# Patient Record
Sex: Female | Born: 1985 | Race: Black or African American | Hispanic: No | Marital: Single | Smoking: Former smoker
Health system: Southern US, Community
[De-identification: ages and names within clinical notes are randomized; demographics above are authoritative.]

## PROBLEM LIST (undated history)

## (undated) DIAGNOSIS — O9089 Other complications of the puerperium, not elsewhere classified: Secondary | ICD-10-CM

## (undated) DIAGNOSIS — R52 Pain, unspecified: Secondary | ICD-10-CM

## (undated) DIAGNOSIS — Z789 Other specified health status: Secondary | ICD-10-CM

## (undated) DIAGNOSIS — K219 Gastro-esophageal reflux disease without esophagitis: Secondary | ICD-10-CM

## (undated) DIAGNOSIS — I10 Essential (primary) hypertension: Secondary | ICD-10-CM

## (undated) HISTORY — PX: OTHER SURGICAL HISTORY: SHX169

## (undated) HISTORY — PX: DILATION AND CURETTAGE OF UTERUS: SHX78

---

## 2005-09-29 ENCOUNTER — Inpatient Hospital Stay (HOSPITAL_COMMUNITY): Admission: RE | Admit: 2005-09-29 | Discharge: 2005-10-02 | Payer: Self-pay | Admitting: Obstetrics & Gynecology

## 2006-11-22 ENCOUNTER — Emergency Department: Payer: Self-pay | Admitting: General Practice

## 2007-05-29 ENCOUNTER — Emergency Department: Payer: Self-pay | Admitting: Emergency Medicine

## 2007-05-30 ENCOUNTER — Emergency Department: Payer: Self-pay | Admitting: Internal Medicine

## 2008-03-23 ENCOUNTER — Emergency Department (HOSPITAL_COMMUNITY): Admission: EM | Admit: 2008-03-23 | Discharge: 2008-03-23 | Payer: Self-pay | Admitting: Emergency Medicine

## 2010-04-06 ENCOUNTER — Other Ambulatory Visit: Admission: RE | Admit: 2010-04-06 | Discharge: 2010-04-06 | Payer: Self-pay | Admitting: Family Medicine

## 2010-04-06 ENCOUNTER — Other Ambulatory Visit: Admission: RE | Admit: 2010-04-06 | Discharge: 2010-04-06 | Payer: Self-pay | Admitting: Unknown Physician Specialty

## 2010-08-31 ENCOUNTER — Emergency Department (HOSPITAL_COMMUNITY)
Admission: EM | Admit: 2010-08-31 | Discharge: 2010-08-31 | Disposition: A | Discharge status: Home / Self Care | Payer: Self-pay | Attending: Emergency Medicine | Admitting: Emergency Medicine

## 2010-08-31 DIAGNOSIS — R109 Unspecified abdominal pain: Secondary | ICD-10-CM | POA: Insufficient documentation

## 2010-08-31 LAB — URINALYSIS, ROUTINE W REFLEX MICROSCOPIC
Bilirubin Urine: NEGATIVE
Glucose, UA: NEGATIVE mg/dL
Hgb urine dipstick: NEGATIVE
Specific Gravity, Urine: 1.02 (ref 1.005–1.030)
pH: 6.5 (ref 5.0–8.0)

## 2010-08-31 LAB — BASIC METABOLIC PANEL
CO2: 29 mEq/L (ref 19–32)
GFR calc non Af Amer: >60 mL/min (ref >60)
Glucose, Bld: 82 mg/dL (ref 70–99)
Potassium: 3.7 mEq/L (ref 3.5–5.1)
Sodium: 139 mEq/L (ref 135–145)

## 2010-08-31 LAB — CBC
HCT: 38 % (ref 36.0–46.0)
MCV: 85.8 fL (ref 78.0–100.0)
RDW: 13.9 % (ref 11.5–15.5)
WBC: 5.8 10*3/uL (ref 4.0–10.5)

## 2010-08-31 LAB — DIFFERENTIAL
Eosinophils Relative: 3 % (ref 0–5)
Lymphocytes Relative: 31 % (ref 12–46)
Lymphs Abs: 1.8 10*3/uL (ref 0.7–4.0)
Monocytes Absolute: 0.6 10*3/uL (ref 0.1–1.0)

## 2010-10-29 NOTE — H&P (Signed)
NAMEDORTHULA, [name]                ACCOUNT NO.:  [id number]   MEDICAL RECORD NO.:  [id number]          PATIENT TYPE:  INP   LOCATION:  LDR2                          FACILITY:  [facility]   PHYSICIAN:  Tilda Burrow, M.D. DATE OF BIRTH:  29-Sep-1985   DATE OF ADMISSION:  09/29/2005  DATE OF DISCHARGE:  LH                                HISTORY & PHYSICAL   ADMISSION DIAGNOSES:  1.  Pregnancy 40+ weeks gestation.  2.  Latent phase labor.  3.  Spontaneous rupture of membranes.   HISTORY OF PRESENT ILLNESS:  This 25 year old female gravida 3, para 0, AB  2, LMP December 21, 2004, placing menstrual [facility] September 26, 2005, with  corresponding ultrasounds is followed since [redacted] weeks gestation, June 01, 2006, through our office.  She transferred from [place], [place].   PRENATAL LABS:  Blood type A positive.  Urine drug screen negative.  Rubella  immune.  Hemoglobin 11, hematocrit 38.  Hepatitis, HIV, RPR, GC and  Chlamydia all negative.  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  Platelets were 127,000. RPR  nonreactive.  She had low grade cervical abnormalities on Pap smear which  will be monitored postpartum.  Group B Strep negative.   She did not attend childbirth classes.  She requested epidural which she has  received already.   PHYSICAL EXAMINATION:  GENERAL:  A healthy-appearing, somewhat nonverbal  [place] female, alert and oriented x3.  She is accompanied by  several family members.  She did not attend childbirth classes.  CERVIX:  By nursing exam, 4 cm at last check.   IMPRESSION:  Pregnancy at term.  Latent phase labor.  Will require Pitocin  augmentation now that epidural is in place.  Will anticipate improved  progress in labor.      Tilda Burrow, M.D.  Electronically Signed     JVF/MEDQ  D:  09/29/2005  T:  09/29/2005  Job:  [id number]   cc:   Dr. Stephania Fragmin  [facility]   [facility]

## 2010-10-29 NOTE — Group Therapy Note (Signed)
NAME:  VEAR, STATON                ACCOUNT NO.:  [id number]   MEDICAL RECORD NO.:  [id number]          PATIENT TYPE:  INP   LOCATION:  LDR2                          FACILITY:  [facility]   PHYSICIAN:  Lazaro Arms, M.D.   DATE OF BIRTH:  10-19-1985   DATE OF PROCEDURE:  DATE OF DISCHARGE:                                   PROGRESS NOTE   Symphanie progressed steadily through labor after Pitocin augmentation.  She  did have frequent variable decelerations.  Most of them were mild in nature  with rapid return to baseline.  She did have the occasional 40-50  __________, usually with the heart rate not decelerating past 100.  Variability remained average, and she maintained accelerations of the fetal  heart rates.   She developed some pressure and urge to push at approximately 2115.  After a  45 minute second stage, she had a spontaneous vaginal delivery of a viable  female infant.  There was no nuchal cord or any obvious reason for the cord  compression.  The mouth and nose were suctioned on the perineum, and the  body delivered without any difficulty.  Weight is 6 pounds, 14 ounces.  Apgars are 9 and 9.   Always interesting, the baby had a lusty cry before the delivery of the  body.   Pitocin 20 units diluted in 1000 cc of lactated Ringer's was infused rapidly  IV.  The placenta separated spontaneously and delivered by a control cord  traction and maternal pushing effort at 2210.  It was inspected and appears  to be intact with a three vessel cord.  The fundus was immediately firm, and  blood loss was minimal.  Estimated blood loss 200 cc.  The vagina was then  inspected, and no lacerations were found.  The patient complained of a lot  of cramping post partum, so she received morphine sulfate, which resulted in  relief from her cramping.  She plans to breast-feed.      Jacklyn Shell, C.N.M.      Lazaro Arms, M.D.  Electronically Signed    FC/MEDQ  D:  09/29/2005  T:   09/30/2005  Job:  [id number]   cc:   Lorin Picket A. Gerda Diss, MD  Fax: [phone]

## 2010-10-29 NOTE — Op Note (Signed)
[name], [name]                ACCOUNT NO.:  [id number]   MEDICAL RECORD NO.:  [id number]          PATIENT TYPE:  INP   LOCATION:  A410                          FACILITY:  [facility]   PHYSICIAN:  Tilda Burrow, M.D. DATE OF BIRTH:  November 11, 1985   DATE OF PROCEDURE:  09/29/2005  DATE OF DISCHARGE:                                 OPERATIVE REPORT   ADMITTING DIAGNOSIS:  .   PROCEDURE:  Epidural catheter placement, September 29, 2005, 2:30 p.m.   Continuous lumbar epidural catheter placed at the L3 interspace, after  patient acknowledged desire for epidural, she has progressed to 4 cm  spontaneously with minimal labor at this time, tolerated poorly by patient.   DETAILS OF THE PROCEDURE:  Epidural was placed on L3-4 interspace using loss  of resistance technique after standard prepping and draping.  Epidural space  was identified 7 cm beneath the skin on first attempt with loss of  resistance technique.  Lidocaine 1.5%, with epinephrine, was injected  through the Touhy needle followed by insertion of the epidural catheter into  the epidural space without difficulty.  The patient had the needle removed,  catheter taped to the back, infusion at 12 mL per hour initiated.  There was  no additional bolus required.  The patient had good analgesic effect,  symmetric at T10 level confirmed.  The patient will now be begun on Pitocin  augmentation of labor.      Tilda Burrow, M.D.  Electronically Signed     JVF/MEDQ  D:  09/29/2005  T:  09/30/2005  Job:  [id number]   cc:   [facility]  [address]  [place], [place] [address]

## 2010-10-29 NOTE — H&P (Signed)
[name], [name]                ACCOUNT NO.:  [id number]   MEDICAL RECORD NO.:  [id number]          PATIENT TYPE:  INP   LOCATION:  LDR2                          FACILITY:  [facility]   PHYSICIAN:  Lazaro Arms, M.D.   DATE OF BIRTH:  [birth year]   DATE OF ADMISSION:  09/29/2005  DATE OF DISCHARGE:  LH                                HISTORY & PHYSICAL   CHIEF COMPLAINT:  Rupture of membranes at 0845 hours.   HISTORY OF PRESENT ILLNESS:  [name] is a 25 year old gravida 3, para 0, AB 2  with an EDC of September 26, 2005, based on last menstrual period and  correlating ultrasound.  She began prenatal care in [place] in her  first trimester and has had regular visits since then.  She transferred to  our office at approximately 23 weeks.  Her prenatal course has basically  been uneventful.   PRENATAL LABS:  Blood type A+.  Rubella immune.  HB SAG, HIV, RPR, gonorrhea  and Chlamydia are all negative.   She did have platelets of 127 back in December; however, subsequent  platelets have been normal in the 160 range.  Her Pap smear revealed LSIL,  for which she had a colposcopy.  Recommendations were colposcopy and  possible biopsy after delivery.  She is positive for HSV 1 and II.  She was  started on suppressive therapy of Valtrex 1 gm daily, about three weeks ago,  and has been compliant.   Blood pressures have been 100-120s/60-70s.  Group B strep was performed;  however, I do not have the results at this time.   PAST MEDICAL HISTORY:  Noncontributory.   No surgical history.   MEDICATIONS:  Prenatal vitamins.   SOCIAL HISTORY:  She is single.  The father of the baby is supportive.   FAMILY HISTORY:  None.   Her OB history is positive for two eight week miscarriages in 2003 and 2006  without complications.   PHYSICAL EXAMINATION:  HEENT:  Within normal limits.  HEART:  Regular rate and rhythm.  LUNGS:  Clear.  ABDOMEN:  Soft and nontender.  She is having some mild  contractions every 3-  5 minutes.  Fetal heart rate upon admission to the birthing unit revealed  average long-term variability with __________ present; however, there were a  few reflex of decelerations, late in nature.  She was given IV fluid bolus  and oxygen via rebreather mask, and that seems to have resolved the pattern  to a normal pattern at this time.  Cervix is 3 cm, 50% effaced, -1 station,  leaking a moderate amount of clear amniotic fluid.  Legs are negative.   IMPRESSION:  Intrauterine pregnancy at 40 weeks, 2 days.  Spontaneous  rupture of membranes, beginning labor.   PLAN:  Expectant management at this point.  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      Jacklyn Shell, C.N.M.      Lazaro Arms, M.D.  Electronically Signed    FC/MEDQ  D:  09/29/2005  T:  09/29/2005  Job:  [id number]   cc:   Francoise Schaumann. Milford Cage DO, FAAP  Fax: [phone]

## 2010-11-10 ENCOUNTER — Emergency Department (HOSPITAL_COMMUNITY)
Admission: EM | Admit: 2010-11-10 | Discharge: 2010-11-10 | Disposition: A | Discharge status: Home / Self Care | Payer: Self-pay | Attending: Emergency Medicine | Admitting: Emergency Medicine

## 2010-11-10 ENCOUNTER — Emergency Department (HOSPITAL_COMMUNITY): Payer: Self-pay

## 2010-11-10 DIAGNOSIS — O200 Threatened abortion: Secondary | ICD-10-CM | POA: Insufficient documentation

## 2010-11-10 DIAGNOSIS — R109 Unspecified abdominal pain: Secondary | ICD-10-CM | POA: Insufficient documentation

## 2010-11-10 LAB — BASIC METABOLIC PANEL
BUN: 8 mg/dL (ref 6–23)
Calcium: 10.1 mg/dL (ref 8.4–10.5)
Creatinine, Ser: 0.68 mg/dL (ref 0.4–1.2)
GFR calc non Af Amer: >60 mL/min (ref >60)
Glucose, Bld: 95 mg/dL (ref 70–99)
Sodium: 134 mEq/L — ABNORMAL LOW (ref 135–145)

## 2010-11-10 LAB — WET PREP, GENITAL
Trich, Wet Prep: NONE SEEN
Yeast Wet Prep HPF POC: NONE SEEN

## 2010-11-10 LAB — RUBELLA ANTIBODY, IGM: Rubella: IMMUNE

## 2010-11-10 LAB — HCG, QUANTITATIVE, PREGNANCY: hCG, Beta Chain, Quant, S: 946 m[IU]/mL — ABNORMAL HIGH (ref <5)

## 2010-11-10 LAB — URINALYSIS, ROUTINE W REFLEX MICROSCOPIC
Glucose, UA: NEGATIVE mg/dL
Hgb urine dipstick: NEGATIVE
Protein, ur: NEGATIVE mg/dL
pH: 6.5 (ref 5.0–8.0)

## 2010-11-10 LAB — HIV ANTIBODY (ROUTINE TESTING W REFLEX): HIV: NONREACTIVE

## 2010-11-10 LAB — POCT PREGNANCY, URINE: Preg Test, Ur: POSITIVE

## 2010-11-10 LAB — ABO/RH: ABO/RH(D): A POS

## 2010-11-10 LAB — ANTIBODY SCREEN: Antibody Screen: NEGATIVE

## 2010-11-12 LAB — GC/CHLAMYDIA PROBE AMP, GENITAL
Chlamydia, DNA Probe: NEGATIVE
GC Probe Amp, Genital: NEGATIVE

## 2010-11-25 LAB — STREP B DNA PROBE: GBS: POSITIVE

## 2010-12-21 ENCOUNTER — Other Ambulatory Visit: Payer: Self-pay | Admitting: Family Medicine

## 2010-12-21 ENCOUNTER — Other Ambulatory Visit (HOSPITAL_COMMUNITY)
Admission: RE | Admit: 2010-12-21 | Discharge: 2010-12-21 | Disposition: A | Discharge status: Home / Self Care | Payer: Medicaid Other | Source: Ambulatory Visit | Attending: Obstetrics and Gynecology | Admitting: Obstetrics and Gynecology

## 2010-12-21 DIAGNOSIS — Z113 Encounter for screening for infections with a predominantly sexual mode of transmission: Secondary | ICD-10-CM | POA: Insufficient documentation

## 2010-12-21 DIAGNOSIS — Z01419 Encounter for gynecological examination (general) (routine) without abnormal findings: Secondary | ICD-10-CM | POA: Insufficient documentation

## 2011-02-27 ENCOUNTER — Other Ambulatory Visit: Payer: Self-pay

## 2011-02-27 ENCOUNTER — Encounter: Payer: Self-pay | Admitting: *Deleted

## 2011-02-27 ENCOUNTER — Emergency Department (HOSPITAL_COMMUNITY)
Admission: EM | Admit: 2011-02-27 | Discharge: 2011-02-28 | Disposition: A | Discharge status: Home / Self Care | Payer: Medicaid Other | Attending: Emergency Medicine | Admitting: Emergency Medicine

## 2011-02-27 DIAGNOSIS — R42 Dizziness and giddiness: Secondary | ICD-10-CM | POA: Insufficient documentation

## 2011-02-27 DIAGNOSIS — E161 Other hypoglycemia: Secondary | ICD-10-CM | POA: Insufficient documentation

## 2011-02-27 DIAGNOSIS — R55 Syncope and collapse: Secondary | ICD-10-CM

## 2011-02-27 DIAGNOSIS — H538 Other visual disturbances: Secondary | ICD-10-CM | POA: Insufficient documentation

## 2011-02-27 DIAGNOSIS — Z331 Pregnant state, incidental: Secondary | ICD-10-CM

## 2011-02-27 DIAGNOSIS — E162 Hypoglycemia, unspecified: Secondary | ICD-10-CM

## 2011-02-27 LAB — URINALYSIS, ROUTINE W REFLEX MICROSCOPIC
Bilirubin Urine: NEGATIVE
Glucose, UA: NEGATIVE mg/dL
Ketones, ur: NEGATIVE mg/dL
Leukocytes, UA: NEGATIVE
Protein, ur: NEGATIVE mg/dL
pH: 7 (ref 5.0–8.0)

## 2011-02-27 LAB — CBC
HCT: 34.3 % — ABNORMAL LOW (ref 36.0–46.0)
Hemoglobin: 11.5 g/dL — ABNORMAL LOW (ref 12.0–15.0)
MCH: 28.2 pg (ref 26.0–34.0)
MCHC: 33.5 g/dL (ref 30.0–36.0)
MCV: 84.1 fL (ref 78.0–100.0)
RDW: 14.4 % (ref 11.5–15.5)

## 2011-02-27 LAB — DIFFERENTIAL
Basophils Absolute: 0 10*3/uL (ref 0.0–0.1)
Basophils Relative: 0 % (ref 0–1)
Eosinophils Relative: 1 % (ref 0–5)
Monocytes Absolute: 0.8 10*3/uL (ref 0.1–1.0)
Monocytes Relative: 8 % (ref 3–12)

## 2011-02-27 LAB — BASIC METABOLIC PANEL
BUN: 11 mg/dL (ref 6–23)
Calcium: 10.2 mg/dL (ref 8.4–10.5)
Creatinine, Ser: 0.64 mg/dL (ref 0.50–1.10)
GFR calc Af Amer: >60 mL/min (ref >60)

## 2011-02-27 LAB — GLUCOSE, CAPILLARY: Glucose-Capillary: 139 mg/dL — ABNORMAL HIGH (ref 70–99)

## 2011-02-27 MED ORDER — SODIUM CHLORIDE 0.9 % IV SOLN: Freq: Once | INTRAVENOUS | Status: DC

## 2011-02-27 NOTE — ED Notes (Signed)
Pt was brought in by [facility] for c/o hypoglycemia; pt states she was at a store when she felt dizzy and hot; pt states she went outside to get some air and was told people were holding her up to prevent her from falling; pt states she did not feel like she passed out; pt's CBG on scene was 50 and was not given anything to eat or drink; pt's CBG here in ED was 83; pt denies any pain

## 2011-02-27 NOTE — ED Provider Notes (Signed)
Scribed for Barbara Bailiff, MD, the patient was seen in room APA01/APA01 . This chart was scribed by Ellie Lunch. This patient's care was started at 7:48 PM.   CSN: [id number] Arrival date & time: 02/27/2011  7:40 PM   Chief Complaint  Patient presents with  . Hypoglycemia     (Include location/radiation/quality/duration/timing/severity/associated sxs/prior treatment) HPI [name] is a 25 y.o. female [redacted] weeks pregnant brought in by EMS Emergency Department complaining of hypoglycemia. Pt reports she was shopping when she became lightheaded, dizzy, and vision blurred and felt like she was going to pass out ~one hour ago. Pt went outside for fresh air and was told that people were holding her up to prevent her from falling. Pt denies losing consciousness. Per EMS CBG was 50 at scene. Pt denies abdominal pain, vaginal bleeding, or vaginal discharge. No history of similar sx.  Pt reports some improvement now. Reports feeling better with food and fluids.   HPI ELEMENTS:   Onset: ~ one hour ago     Modifying factors: improved with fluids and food  Context: as above  Associated symptoms: dizziness, lightheadedness, blurred vision   HPI  History reviewed. No pertinent past medical history.   Past Surgical History  Procedure Date  . Dilation and curettage of uterus     History reviewed. No pertinent family history.  History  Substance Use Topics  . Smoking status: Former Games developer  . Smokeless tobacco: Not on file  . Alcohol Use: No    OB History    Grav Para Term Preterm Abortions TAB SAB Ect Mult Living   4  1  2           Review of Systems 10 Systems reviewed and are negative for acute change except as noted in the HPI.   Allergies  Review of patient's allergies indicates no known allergies.  Home Medications   Current Outpatient Rx  Name Route Sig Dispense Refill  . PRENATAL VITAMIN PO Oral Take by mouth.        Physical Exam    BP 90/57  Pulse 85   Temp(Src) 98.6 F (37 C) (Oral)  Resp 20  Ht 5\' 4"  (1.626 m)  Wt 250 lb (113.399 kg)  BMI 42.91 kg/m2  SpO2 100%  Physical Exam  Nursing note and vitals reviewed. Constitutional: She is oriented to person, place, and time. She appears well-developed and well-nourished.  HENT:  Head: Normocephalic and atraumatic.  Eyes: Conjunctivae and EOM are normal. Pupils are equal, round, and reactive to light.  Neck: Normal range of motion. Neck supple.  Cardiovascular: Normal rate, regular rhythm and normal heart sounds.   Pulmonary/Chest: Effort normal and breath sounds normal.  Abdominal: Soft. There is no tenderness.  Musculoskeletal: Normal range of motion.  Neurological: She is alert and oriented to person, place, and time.  Skin: Skin is warm and dry.  Psychiatric: She has a normal mood and affect.   Procedures  OTHER DATA REVIEWED: Nursing notes, vital signs, and past medical records reviewed.   DIAGNOSTIC STUDIES: Oxygen Saturation is 100% on room air, normal by my interpretation.    LABS / RADIOLOGY:  Results for orders placed during the hospital encounter of 02/27/11  GLUCOSE, CAPILLARY      Component Value Range   Glucose-Capillary 83  70 - 99 (mg/dL)  CBC      Component Value Range   WBC 10.1  4.0 - 10.5 (K/uL)   RBC 4.08  3.87 - 5.11 (MIL/uL)  Hemoglobin 11.5 (*) 12.0 - 15.0 (g/dL)   HCT 04.5 (*) 40.9 - 46.0 (%)   MCV 84.1  78.0 - 100.0 (fL)   MCH 28.2  26.0 - 34.0 (pg)   MCHC 33.5  30.0 - 36.0 (g/dL)   RDW 81.1  91.4 - 78.2 (%)   Platelets 162  150 - 400 (K/uL)  DIFFERENTIAL      Component Value Range   Neutrophils Relative 74  43 - 77 (%)   Neutro Abs 7.5  1.7 - 7.7 (K/uL)   Lymphocytes Relative 17  12 - 46 (%)   Lymphs Abs 1.7  0.7 - 4.0 (K/uL)   Monocytes Relative 8  3 - 12 (%)   Monocytes Absolute 0.8  0.1 - 1.0 (K/uL)   Eosinophils Relative 1  0 - 5 (%)   Eosinophils Absolute 0.1  0.0 - 0.7 (K/uL)   Basophils Relative 0  0 - 1 (%)   Basophils  Absolute 0.0  0.0 - 0.1 (K/uL)  BASIC METABOLIC PANEL      Component Value Range   Sodium 132 (*) 135 - 145 (mEq/L)   Potassium 3.6  3.5 - 5.1 (mEq/L)   Chloride 103  96 - 112 (mEq/L)   CO2 20  19 - 32 (mEq/L)   Glucose, Bld 79  70 - 99 (mg/dL)   BUN 11  6 - 23 (mg/dL)   Creatinine, Ser 9.56  0.50 - 1.10 (mg/dL)   Calcium 21.3  8.4 - 10.5 (mg/dL)   GFR calc non Af Amer >60  >60 (mL/min)   GFR calc Af Amer >60  >60 (mL/min)  URINALYSIS, ROUTINE W REFLEX MICROSCOPIC      Component Value Range   Color, Urine YELLOW  YELLOW    Appearance CLEAR  CLEAR    Specific Gravity, Urine 1.015  1.005 - 1.030    pH 7.0  5.0 - 8.0    Glucose, UA NEGATIVE  NEGATIVE (mg/dL)   Hgb urine dipstick NEGATIVE  NEGATIVE    Bilirubin Urine NEGATIVE  NEGATIVE    Ketones, ur NEGATIVE  NEGATIVE (mg/dL)   Protein, ur NEGATIVE  NEGATIVE (mg/dL)   Urobilinogen, UA 0.2  0.0 - 1.0 (mg/dL)   Nitrite NEGATIVE  NEGATIVE    Leukocytes, UA NEGATIVE  NEGATIVE   GLUCOSE, CAPILLARY      Component Value Range   Glucose-Capillary 139 (*) 70 - 99 (mg/dL)    ED COURSE / COORDINATION OF CARE: 20:16 EDP discussed diagnostic and treatment plan with Pt. EDP ordered the following Orders Placed This Encounter  Procedures  . Glucose, capillary  . CBC  . Differential  . Basic metabolic panel  . Urinalysis with microscopic  . Glucose, capillary  . Diet regular  . EKG test   Medications  0.9 %  sodium chloride infusion (not administered)   20:18 EDP at pt bedside performing [place] to rule in IUP. Intrauterine pregnancy with no free fluid in pelvis.  Strong heartbeat with normal HR 21:23 Pt recheck.  22:23 Pt recheck. Pt reports improvement after eating. EDP ordered additional CBG.    Date: 02/27/2011  Rate: 74  Rhythm: normal sinus rhythm  QRS Axis: normal  Intervals: normal  ST/T Wave abnormalities: normal  Conduction Disutrbances:none  Narrative Interpretation: no malignant arrhythmia  Old EKG Reviewed: none  available  MDM: Patient presents with near syncope secondary to hypoglycemia. Patient states she did not eat properly today.  Since the patient is pregnant i performed a bedside US which  showed an IUP with no free fluid in pelvis. A heartbeat was present. Patient has no symptoms related to her pregnancy such as abdominal pain, vaginal bleeding, leakage of fluid. She has good prenatal care. She did document a blood sugar by EMS in the 50s. She had improvement of her symptoms after IV fluids and food. Patient walked around the department without return of her symptoms. EKG was performed and unremarkable. I instructed the patient to continue aggressive oral hydration. I feel patient is safe for discharge home. I encouraged her to eat properly especially given her pregnancy. I instructed her to follow up with her OB/GYN as well as her primary care physician this week.  SCRIBE ATTESTATION: I personally performed the services described in this documentation, which was scribed in my presence. The recorded information has been reviewed and considered.          Barbara Bailiff, MD 02/27/11 [phone]

## 2011-02-27 NOTE — ED Notes (Signed)
Provided socks for patients

## 2011-06-14 NOTE — L&D Delivery Note (Signed)
Delivery Note At 9:50 AM a viable female was delivered via Vaginal, Spontaneous Delivery (Presentation: Right Occiput Anterior).  APGAR: 9, 9; weight 7 lb 6 oz (3345 g).   Placenta status: Intact, Spontaneous.  Cord: 3 vessels with the following complications: None.  Cord pH: No indicated. Mild shoulder dystocia. McRoberts, suprapubic pressure and Wood screw maneuvers applied and baby's shoulders released with no complications.  Anesthesia: Epidural  Episiotomy: None Lacerations: None Suture Repair: n/a Est. Blood Loss (mL): 200  Mom to postpartum.  Baby to nursery-stable.  D. Piloto Sherron Flemings Paz. MD PGY-1  06/30/2011, 10:47 AM  I was present and precepted the above delivery.  Agree with Dr Willaim Rayas note. Candelaria Celeste JEHIEL 06/30/2011 6:48 PM

## 2011-06-14 NOTE — L&D Delivery Note (Deleted)
Delivery Note At 9:50 AM a viable female was delivered via  (Presentation: vertex: occiput, anterior ).  APGAR: 9, 9; weight 7 lb 6 oz (3345 g).   Placenta status: intact .  Cord: 3 vessels with the following complications: none.  Cord pH: not indicated.  Anesthesia:  epidural Episiotomy: none Lacerations: none Suture Repair: n/a Est. Blood Loss (mL): 200  Mom to postpartum.  Baby to nursery-stable.   D. Piloto Sherron Flemings Paz. MD PGY-1  06/30/2011, 10:11 AM

## 2011-06-29 ENCOUNTER — Encounter (HOSPITAL_COMMUNITY): Payer: Self-pay | Admitting: *Deleted

## 2011-06-29 ENCOUNTER — Inpatient Hospital Stay (HOSPITAL_COMMUNITY)
Admission: AD | Admit: 2011-06-29 | Discharge: 2011-07-02 | DRG: 775 | Disposition: A | Discharge status: Home / Self Care | Payer: Medicaid Other | Source: Ambulatory Visit | Attending: Obstetrics & Gynecology | Admitting: Obstetrics & Gynecology

## 2011-06-29 DIAGNOSIS — O479 False labor, unspecified: Secondary | ICD-10-CM

## 2011-06-29 DIAGNOSIS — Z22330 Carrier of Group B streptococcus: Secondary | ICD-10-CM

## 2011-06-29 DIAGNOSIS — O99892 Other specified diseases and conditions complicating childbirth: Principal | ICD-10-CM | POA: Diagnosis present

## 2011-06-29 HISTORY — DX: Other specified health status: Z78.9

## 2011-06-29 LAB — CBC
Hemoglobin: 11.2 g/dL — ABNORMAL LOW (ref 12.0–15.0)
MCV: 84 fL (ref 78.0–100.0)
Platelets: 143 10*3/uL — ABNORMAL LOW (ref 150–400)
RBC: 4.01 MIL/uL (ref 3.87–5.11)
WBC: 11 10*3/uL — ABNORMAL HIGH (ref 4.0–10.5)

## 2011-06-29 LAB — RPR: RPR: NONREACTIVE

## 2011-06-29 LAB — HIV ANTIBODY (ROUTINE TESTING W REFLEX): HIV: NONREACTIVE

## 2011-06-29 MED ORDER — CITRIC ACID-SODIUM CITRATE 334-500 MG/5ML PO SOLN: 30.0000 mL | ORAL | Status: DC | PRN

## 2011-06-29 MED ORDER — IBUPROFEN 600 MG PO TABS
600.0000 mg | ORAL_TABLET | Freq: Four times a day (QID) | ORAL | Status: DC | PRN
  Administered 2011-06-30: 600 mg via ORAL
  Filled 2011-06-29: qty 1

## 2011-06-29 MED ORDER — PENICILLIN G POTASSIUM 5000000 UNITS IJ SOLR
2.5000 10*6.[IU] | INTRAVENOUS | Status: DC
  Administered 2011-06-30 (×2): 2.5 10*6.[IU] via INTRAVENOUS
  Filled 2011-06-29 (×4): qty 2.5

## 2011-06-29 MED ORDER — LACTATED RINGERS IV SOLN
INTRAVENOUS | Status: DC
  Administered 2011-06-29: 125 mL/h via INTRAVENOUS

## 2011-06-29 MED ORDER — ZOLPIDEM TARTRATE 10 MG PO TABS: 10.0000 mg | ORAL_TABLET | Freq: Every evening | ORAL | Status: DC | PRN

## 2011-06-29 MED ORDER — OXYTOCIN 20 UNITS IN LACTATED RINGERS INFUSION - SIMPLE
125.0000 mL/h | Freq: Once | INTRAVENOUS | Status: AC
  Administered 2011-06-30: 125 mL/h via INTRAVENOUS

## 2011-06-29 MED ORDER — PENICILLIN G POTASSIUM 5000000 UNITS IJ SOLR
5.0000 10*6.[IU] | Freq: Once | INTRAVENOUS | Status: AC
  Administered 2011-06-30: 5 10*6.[IU] via INTRAVENOUS
  Filled 2011-06-29: qty 5

## 2011-06-29 MED ORDER — HYDROXYZINE HCL 50 MG PO TABS: 50.0000 mg | ORAL_TABLET | Freq: Four times a day (QID) | ORAL | Status: DC | PRN

## 2011-06-29 MED ORDER — HYDROXYZINE HCL 50 MG/ML IM SOLN: 50.0000 mg | Freq: Four times a day (QID) | INTRAMUSCULAR | Status: DC | PRN

## 2011-06-29 MED ORDER — LIDOCAINE HCL (PF) 1 % IJ SOLN
30.0000 mL | INTRAMUSCULAR | Status: DC | PRN
  Filled 2011-06-29: qty 30

## 2011-06-29 MED ORDER — OXYCODONE-ACETAMINOPHEN 5-325 MG PO TABS
2.0000 | ORAL_TABLET | Freq: Once | ORAL | Status: AC
  Administered 2011-06-29: 2 via ORAL
  Filled 2011-06-29: qty 2

## 2011-06-29 MED ORDER — TERBUTALINE SULFATE 1 MG/ML IJ SOLN: 0.2500 mg | Freq: Once | INTRAMUSCULAR | Status: AC | PRN

## 2011-06-29 MED ORDER — MISOPROSTOL 25 MCG QUARTER TABLET: 25.0000 ug | ORAL_TABLET | ORAL | Status: DC | PRN

## 2011-06-29 MED ORDER — FLEET ENEMA 7-19 GM/118ML RE ENEM: 1.0000 | ENEMA | RECTAL | Status: DC | PRN

## 2011-06-29 MED ORDER — NALBUPHINE SYRINGE 5 MG/0.5 ML
5.0000 mg | INJECTION | INTRAMUSCULAR | Status: DC | PRN
  Administered 2011-06-30: 5 mg via INTRAVENOUS
  Filled 2011-06-29: qty 0.5

## 2011-06-29 MED ORDER — ACETAMINOPHEN 325 MG PO TABS: 650.0000 mg | ORAL_TABLET | ORAL | Status: DC | PRN

## 2011-06-29 MED ORDER — OXYTOCIN BOLUS FROM INFUSION
500.0000 mL | Freq: Once | INTRAVENOUS | Status: DC
  Filled 2011-06-29: qty 500

## 2011-06-29 MED ORDER — OXYTOCIN 20 UNITS IN LACTATED RINGERS INFUSION - SIMPLE
1.0000 m[IU]/min | INTRAVENOUS | Status: DC
  Administered 2011-06-30: 2 m[IU]/min via INTRAVENOUS
  Filled 2011-06-29: qty 1000

## 2011-06-29 MED ORDER — LACTATED RINGERS IV SOLN: 500.0000 mL | INTRAVENOUS | Status: DC | PRN

## 2011-06-29 MED ORDER — ONDANSETRON HCL 4 MG/2ML IJ SOLN: 4.0000 mg | Freq: Four times a day (QID) | INTRAMUSCULAR | Status: DC | PRN

## 2011-06-29 MED ORDER — OXYCODONE-ACETAMINOPHEN 5-325 MG PO TABS: 2.0000 | ORAL_TABLET | ORAL | Status: DC | PRN

## 2011-06-29 NOTE — Progress Notes (Signed)
Cresenzo CNM notified of pt status, FHR, UC pattern and pts pain level. Orders received to observe for another hour. Will continue to monitor.

## 2011-06-29 NOTE — ED Provider Notes (Signed)
History    Chief Complaint  Patient presents with  . Labor Eval   HPI [id number] @ 38.4 wks. Patient is followed by Dr. Despina Hidden at [facility] and presents today due to having contractions every 3-5 minutes. She reports her cervix was 3.5 cm 2 days ago (01/14). Good fetal movement.  Denies ROM. She feels like her mucous plug started coming out yesterday. And she had a big glob come out this morning.   OB History    Grav Para Term Preterm Abortions TAB SAB Ect Mult Living   4 1 1  0 2     1      Past Medical History  Diagnosis Date  . No pertinent past medical history     Past Surgical History  Procedure Date  . Dilation and curettage of uterus     No family history on file.  History  Substance Use Topics  . Smoking status: Former Games developer  . Smokeless tobacco: Not on file  . Alcohol Use: No    Allergies: No Known Allergies  Prescriptions prior to admission  Medication Sig Dispense Refill  . Prenatal Vit-Fe Sulfate-FA (PRENATAL VITAMIN PO) Take by mouth.          ROS Per HPI. Physical Exam   Blood pressure 126/65, pulse 81, temperature 98.2 F (36.8 C), temperature source Oral, resp. rate 20, height 5\' 5"  (1.651 m), weight 290 lb (131.543 kg), SpO2 99.00%.  Physical Exam Cervical exam: 3.5 cm/25%/-3 MAU Course  Procedures CST FHT: Cat II   FHR 120   Variability: minimal-moderate   Accels: positive   Decels: none Contractions: q 4-6 minutes  MDM @ 18:20 Her cervical exam is unchanged in dilation as it was 2 days ago.  Will monitor and re-check in 1-2 hours.  Will follow-up on FHT to see if variability picks up. Patient has only been on monitor for about 10 minutes.   @ 19:50 Patient appears slightly more dilated than previously. Now at 4cm/50%.  Contractions slightly increased now every 3-5 minutes. Strength of contractions about the same.  Will continue to monitor for a while longer and re-check in 1-2 hours.  Precepted with Marylee Floras,  mid-wife.    @ 2215:  Pt's cervix is unchanged despite mild contractions q 3-5 minutes. Pt does not feel like they are getting stronger.  FHR Cat 1 tracing, reactive with frequent accels, no decels.  Earlier described "decels" appear to be long term variability changes rather than decelerations.  Will d/c pt with instructions to return if contractions increase in frequency and/or intensity. Assessment and Plan    OH PARK, ANGELA 06/29/2011, 6:22 PM

## 2011-06-29 NOTE — Progress Notes (Signed)
Patient states she is having contractions every 3-5 minutes. Denies bleeding or leaking and reports good fetal movement. Patient states she was 3.5 cm on 1-14.

## 2011-06-29 NOTE — H&P (Signed)
  [name] is a 26 y.o. female [phone] with IUP at [redacted]w[redacted]d presenting for ROM. Pt states she has been having regular, every 3-5 minutes contractions, associated with none vaginal bleeding, membranes are intact, ruptured, clear fluid, with active fetal movement.   [facility].  She has been observed in L&D for the past 5 hours and was discharged due to not having any cervical change.  WHen she got up to get dressed, she had gross ROM with clear fluid.  Pt wants to have a natural childbirth and wants to delay augmentation for now.  Prenatal History/Complications: GBS and HSV 2 + Past Medical History: Past Medical History  Diagnosis Date  . No pertinent past medical history     Past Surgical History: Past Surgical History  Procedure Date  . Dilation and curettage of uterus     Obstetrical History: OB History    Grav Para Term Preterm Abortions TAB SAB Ect Mult Living   4 1 1  0 2     1      Gynecological History: OB History    Grav Para Term Preterm Abortions TAB SAB Ect Mult Living   4 1 1  0 2     1      Social History: History   Social History  . Marital Status: Single    Spouse Name: N/A    Number of Children: N/A  . Years of Education: N/A   Social History Main Topics  . Smoking status: Former Games developer  . Smokeless tobacco: Not on file  . Alcohol Use: No  . Drug Use:   . Sexually Active: Yes    Birth Control/ Protection: Other-see comments     pt is currently pregnant   Other Topics Concern  . Not on file   Social History Narrative  . No narrative on file    Family History: No family history on file.  Allergies: No Known Allergies  Prescriptions prior to admission  Medication Sig Dispense Refill  . Prenatal Vit-Fe Fumarate-FA (PRENATAL MULTIVITAMIN) TABS Take 1 tablet by mouth every morning.        Review of Systems - Negative    Blood pressure 107/62, pulse 86, temperature 98.1 F (36.7 C), temperature source Oral, resp.  rate 18, height 5\' 5"  (1.651 m), weight 131.543 kg (290 lb), SpO2 99.00%. General appearance: alert, cooperative and no distress Lungs: clear to auscultation bilaterally Heart: regular rate and rhythm Abdomen: soft, non-tender; bowel sounds normal Extremities: Homans sign is negative, no sign of DVT DTR's 2+ Presentation: cephalic Fetal monitoringBaseline: 140 bpm, Variability: Good {> 6 bpm), Accelerations: Reactive and Decelerations: Absent Uterine activity mild, q 3-4 minutes Dilation: 4 Effacement (%): 50 Station: -3 Exam by:: cresenzo   Prenatal labs: ABO, Rh: A/Positive/-- (05/30 1807) Antibody: Negative (05/30 0000) Rubella:   RPR: Nonreactive (01/16 0000)  HBsAg: Negative (05/30 1806)  HIV: Non-reactive (01/16 0000)  GBS: Positive (06/14 0000)  2 hr GTT:  83/122/95  Genetic screening  negative Anatomy US normal  .resultrcnt[24h  Assessment: [name] is a 26 y.o. [phone] with an IUP at [redacted]w[redacted]d presenting for ROM  Plan: Admit; if not active labor in 6 hours, will augment with Pitocin   CRESENZO-DISHMAN,Jet Armbrust 06/29/2011, 11:23 PM

## 2011-06-30 ENCOUNTER — Encounter (HOSPITAL_COMMUNITY): Payer: Self-pay | Admitting: *Deleted

## 2011-06-30 ENCOUNTER — Inpatient Hospital Stay (HOSPITAL_COMMUNITY): Payer: Medicaid Other | Admitting: Anesthesiology

## 2011-06-30 ENCOUNTER — Encounter (HOSPITAL_COMMUNITY): Payer: Self-pay | Admitting: Anesthesiology

## 2011-06-30 DIAGNOSIS — O9989 Other specified diseases and conditions complicating pregnancy, childbirth and the puerperium: Secondary | ICD-10-CM

## 2011-06-30 DIAGNOSIS — Z22330 Carrier of Group B streptococcus: Secondary | ICD-10-CM

## 2011-06-30 DIAGNOSIS — O9089 Other complications of the puerperium, not elsewhere classified: Secondary | ICD-10-CM

## 2011-06-30 DIAGNOSIS — R52 Pain, unspecified: Secondary | ICD-10-CM

## 2011-06-30 HISTORY — DX: Pain, unspecified: R52

## 2011-06-30 HISTORY — DX: Other complications of the puerperium, not elsewhere classified: O90.89

## 2011-06-30 MED ORDER — EPHEDRINE 5 MG/ML INJ: 10.0000 mg | INTRAVENOUS | Status: DC | PRN

## 2011-06-30 MED ORDER — PHENYLEPHRINE 40 MCG/ML (10ML) SYRINGE FOR IV PUSH (FOR BLOOD PRESSURE SUPPORT): 80.0000 ug | PREFILLED_SYRINGE | INTRAVENOUS | Status: DC | PRN

## 2011-06-30 MED ORDER — DIPHENHYDRAMINE HCL 25 MG PO CAPS: 25.0000 mg | ORAL_CAPSULE | Freq: Four times a day (QID) | ORAL | Status: DC | PRN

## 2011-06-30 MED ORDER — TETANUS-DIPHTH-ACELL PERTUSSIS 5-2.5-18.5 LF-MCG/0.5 IM SUSP
0.5000 mL | Freq: Once | INTRAMUSCULAR | Status: AC
  Administered 2011-07-01: 0.5 mL via INTRAMUSCULAR
  Filled 2011-06-30: qty 0.5

## 2011-06-30 MED ORDER — LACTATED RINGERS IV SOLN
500.0000 mL | Freq: Once | INTRAVENOUS | Status: AC
  Administered 2011-06-30: 1000 mL via INTRAVENOUS

## 2011-06-30 MED ORDER — ONDANSETRON HCL 4 MG/2ML IJ SOLN: 4.0000 mg | INTRAMUSCULAR | Status: DC | PRN

## 2011-06-30 MED ORDER — FENTANYL 2.5 MCG/ML BUPIVACAINE 1/10 % EPIDURAL INFUSION (WH - ANES)
INTRAMUSCULAR | Status: DC | PRN
  Administered 2011-06-30: 14 mL/h via EPIDURAL

## 2011-06-30 MED ORDER — SIMETHICONE 80 MG PO CHEW: 80.0000 mg | CHEWABLE_TABLET | ORAL | Status: DC | PRN

## 2011-06-30 MED ORDER — SENNOSIDES-DOCUSATE SODIUM 8.6-50 MG PO TABS
2.0000 | ORAL_TABLET | Freq: Every day | ORAL | Status: DC
  Administered 2011-06-30 – 2011-07-01 (×2): 2 via ORAL

## 2011-06-30 MED ORDER — PHENYLEPHRINE 40 MCG/ML (10ML) SYRINGE FOR IV PUSH (FOR BLOOD PRESSURE SUPPORT)
80.0000 ug | PREFILLED_SYRINGE | INTRAVENOUS | Status: DC | PRN
  Filled 2011-06-30: qty 5

## 2011-06-30 MED ORDER — LANOLIN HYDROUS EX OINT: TOPICAL_OINTMENT | CUTANEOUS | Status: DC | PRN

## 2011-06-30 MED ORDER — EPHEDRINE 5 MG/ML INJ
10.0000 mg | INTRAVENOUS | Status: DC | PRN
  Filled 2011-06-30: qty 4

## 2011-06-30 MED ORDER — DIBUCAINE 1 % RE OINT: 1.0000 "application " | TOPICAL_OINTMENT | RECTAL | Status: DC | PRN

## 2011-06-30 MED ORDER — PRENATAL MULTIVITAMIN CH
1.0000 | ORAL_TABLET | Freq: Every day | ORAL | Status: DC
  Administered 2011-06-30 – 2011-07-01 (×2): 1 via ORAL
  Filled 2011-06-30 (×3): qty 1

## 2011-06-30 MED ORDER — ZOLPIDEM TARTRATE 5 MG PO TABS: 5.0000 mg | ORAL_TABLET | Freq: Every evening | ORAL | Status: DC | PRN

## 2011-06-30 MED ORDER — FENTANYL 2.5 MCG/ML BUPIVACAINE 1/10 % EPIDURAL INFUSION (WH - ANES)
14.0000 mL/h | INTRAMUSCULAR | Status: DC
  Filled 2011-06-30: qty 60

## 2011-06-30 MED ORDER — BENZOCAINE-MENTHOL 20-0.5 % EX AERO: 1.0000 "application " | INHALATION_SPRAY | CUTANEOUS | Status: DC | PRN

## 2011-06-30 MED ORDER — LIDOCAINE HCL 1.5 % IJ SOLN
INTRAMUSCULAR | Status: DC | PRN
  Administered 2011-06-30 (×2): 4 mL via EPIDURAL

## 2011-06-30 MED ORDER — OXYCODONE-ACETAMINOPHEN 5-325 MG PO TABS
1.0000 | ORAL_TABLET | ORAL | Status: DC | PRN
  Administered 2011-06-30 – 2011-07-01 (×4): 1 via ORAL
  Administered 2011-07-01 – 2011-07-02 (×3): 2 via ORAL
  Administered 2011-07-02 (×2): 1 via ORAL
  Filled 2011-06-30: qty 1
  Filled 2011-06-30: qty 2
  Filled 2011-06-30 (×2): qty 1
  Filled 2011-06-30 (×2): qty 2
  Filled 2011-06-30 (×2): qty 1
  Filled 2011-06-30: qty 2

## 2011-06-30 MED ORDER — DIPHENHYDRAMINE HCL 50 MG/ML IJ SOLN: 12.5000 mg | INTRAMUSCULAR | Status: DC | PRN

## 2011-06-30 MED ORDER — ONDANSETRON HCL 4 MG PO TABS: 4.0000 mg | ORAL_TABLET | ORAL | Status: DC | PRN

## 2011-06-30 MED ORDER — WITCH HAZEL-GLYCERIN EX PADS: 1.0000 "application " | MEDICATED_PAD | CUTANEOUS | Status: DC | PRN

## 2011-06-30 MED ORDER — IBUPROFEN 600 MG PO TABS
600.0000 mg | ORAL_TABLET | Freq: Four times a day (QID) | ORAL | Status: DC
  Administered 2011-06-30 – 2011-07-02 (×8): 600 mg via ORAL
  Filled 2011-06-30 (×9): qty 1

## 2011-06-30 MED ORDER — FENTANYL CITRATE 0.05 MG/ML IJ SOLN
100.0000 ug | Freq: Once | INTRAMUSCULAR | Status: AC
  Administered 2011-06-30: 100 ug via INTRAVENOUS
  Filled 2011-06-30: qty 2

## 2011-06-30 NOTE — Progress Notes (Signed)
Patient ID: [name], female   DOB: 07/14/1985, 26 y.o.   MRN: [id number] Patient reports pain with contractions. Given nubain x1 with some improvement Pt has not other complaints at this time, denies fever  AFVSS, exam stable Monitor shows CTX irregular q2-68mins, FHR 120 moderate variability, no notable accelerations, no decelerations, overall category I tracing Started on Pitocin, currently at 10 mls/hr  Plan to continue care, pain control - patient offered epidural, does not want at this time Anticipate SVD

## 2011-06-30 NOTE — Progress Notes (Signed)
   [name] is a 26 y.o. [phone] at [redacted]w[redacted]d  admitted for rupture of membranes  Subjective:  Contractions are spacing out  Objective: BP 122/68  Pulse 82  Temp(Src) 98.2 F (36.8 C) (Oral)  Resp 18  Ht 5\' 5"  (1.651 m)  Wt 131.543 kg (290 lb)  BMI 48.26 kg/m2  SpO2 99%    FHT:  FHR: 130 bpm, variability: moderate,  accelerations:  Present,  decelerations:  Absent UC:   irregular, every 5-7 minutes SVE:   Dilation: 4.5 Effacement (%): 50 Station: -2 Exam by:: Barbara Coombe RN  Labs: Lab Results  Component Value Date   WBC 11.0* 06/29/2011   HGB 11.2* 06/29/2011   HCT 33.7* 06/29/2011   MCV 84.0 06/29/2011   PLT 143* 06/29/2011    Assessment / Plan: Protracted latent phase Pt wants to try a  Natural birth, so will stick with agreed upon plan to augment if contractions don't pick up an labor doesn't ensue in a few hours  Labor: protracted latent phase Fetal Wellbeing:  Category I Pain Control:  Labor support without medications Anticipated MOD:  NSVD  Franco,Barbara Kuehnel 06/30/2011, 2:19 AM

## 2011-06-30 NOTE — Progress Notes (Addendum)
Subjective: Feels pressure  Objective: BP 118/77  Pulse 112  Temp(Src) 97.4 F (36.3 C) (Oral)  Resp 20  Ht 5\' 5"  (1.651 m)  Wt 131.543 kg (290 lb)  BMI 48.26 kg/m2  SpO2 100%     FHT:  FHR: 120s bpm, variability: moderate,  accelerations:  Abscent,  decelerations:  Present variable UC:   regular, every 2-3 minutes SVE:   Dilation: Lip/rim Effacement (%): 100 Station: 0 Exam by:: Stinson, DO  Labs: Lab Results  Component Value Date   WBC 11.0* 06/29/2011   HGB 11.2* 06/29/2011   HCT 33.7* 06/29/2011   MCV 84.0 06/29/2011   PLT 143* 06/29/2011    Assessment / Plan: Augmentation of labor.  Will continue to labor, expect NSVD.  STINSON, JACOB JEHIEL 06/30/2011, 9:35 AM

## 2011-06-30 NOTE — Progress Notes (Signed)
   [name] is a 26 y.o. [phone] at [redacted]w[redacted]d  admitted for active labor, rupture of membranes  Subjective:  IV Nubain "only makes me sleepy"  Still wants to try to avoid an epidural  Objective: BP 131/81  Pulse 80  Temp(Src) 98.2 F (36.8 C) (Oral)  Resp 22  Ht 5\' 5"  (1.651 m)  Wt 131.543 kg (290 lb)  BMI 48.26 kg/m2  SpO2 99%    FHT:  FHR: 120 bpm, variability: moderate,  accelerations:  Present,  decelerations:  Absent UC:   regular, every 2-3 minutes SVE:   Dilation: 7 Effacement (%): 90 Station: -1 Exam by:: Everrett Coombe RN  Labs: Lab Results  Component Value Date   WBC 11.0* 06/29/2011   HGB 11.2* 06/29/2011   HCT 33.7* 06/29/2011   MCV 84.0 06/29/2011   PLT 143* 06/29/2011    Assessment / Plan: Augmentation of labor, progressing well  Labor: Progressing normally Fetal Wellbeing:  Category I Pain Control:  will  try fentanyl Anticipated MOD:  NSVD  CRESENZO-DISHMAN,[name] 06/30/2011, 6:38 AM

## 2011-06-30 NOTE — Anesthesia Preprocedure Evaluation (Addendum)
Anesthesia Evaluation  Patient identified by MRN, date of birth, ID band Patient awake    Reviewed: Allergy & Precautions, H&P , Patient's Chart, lab work & pertinent test results  Airway Mallampati: III TM Distance: >3 FB Neck ROM: Full    Dental No notable dental hx. (+) Teeth Intact   Pulmonary neg pulmonary ROS,  clear to auscultation  Pulmonary exam normal       Cardiovascular neg cardio ROS Regular Normal    Neuro/Psych Negative Neurological ROS  Negative Psych ROS   GI/Hepatic negative GI ROS, Neg liver ROS,   Endo/Other  Negative Endocrine ROS  Renal/GU negative Renal ROS  Genitourinary negative   Musculoskeletal negative musculoskeletal ROS (+)   Abdominal (+) obese,   Peds  Hematology negative hematology ROS (+)   Anesthesia Other Findings Pierced Tongue  Reproductive/Obstetrics (+) Pregnancy                          Anesthesia Physical Anesthesia Plan  ASA: III  Anesthesia Plan: Epidural   Post-op Pain Management:    Induction:   Airway Management Planned:   Additional Equipment:   Intra-op Plan:   Post-operative Plan:   Informed Consent: I have reviewed the patients History and Physical, chart, labs and discussed the procedure including the risks, benefits and alternatives for the proposed anesthesia with the patient or authorized representative who has indicated his/her understanding and acceptance.     Plan Discussed with: CRNA, Anesthesiologist and Surgeon  Anesthesia Plan Comments:         Anesthesia Quick Evaluation

## 2011-06-30 NOTE — Anesthesia Procedure Notes (Signed)
Epidural Patient location during procedure: OB Start time: 06/30/2011 8:25 AM  Staffing Anesthesiologist: Anam Bobby A. Performed by: anesthesiologist   Preanesthetic Checklist Completed: patient identified, site marked, surgical consent, pre-op evaluation, timeout performed, IV checked, risks and benefits discussed and monitors and equipment checked  Epidural Patient position: sitting Prep: site prepped and draped and DuraPrep Patient monitoring: continuous pulse ox and blood pressure Approach: midline Injection technique: LOR air  Needle:  Needle type: Tuohy  Needle gauge: 17 G Needle length: 9 cm Needle insertion depth: 8 cm Catheter type: closed end flexible Catheter size: 19 Gauge Catheter at skin depth: 14 cm Test dose: negative and 1.5% lidocaine  Assessment Events: blood not aspirated, injection not painful, no injection resistance, negative IV test and no paresthesia  Additional Notes Patient identified. Risks and benefits discussed including failed block, incomplete  Pain control, post dural puncture headache, nerve damage, paralysis, blood pressure Changes, nausea, vomiting, reactions to medications-both toxic and allergic and post Partum back pain. All questions were answered. Patient expressed understanding and wished to proceed. Sterile technique was used throughout procedure. Epidural site was Dressed with sterile barrier dressing. No paresthesias, signs of intravascular injection Or signs of intrathecal spread were encountered.  Patient was more comfortable after the epidural was dosed. Please see RN's note for documentation of vital signs and FHR which are stable.

## 2011-07-01 MED ORDER — IBUPROFEN 600 MG PO TABS: 600.0000 mg | ORAL_TABLET | Freq: Four times a day (QID) | ORAL | Status: AC

## 2011-07-01 MED ORDER — NORETHINDRONE 0.35 MG PO TABS: 1.0000 | ORAL_TABLET | Freq: Every day | ORAL | Status: DC

## 2011-07-01 NOTE — Progress Notes (Signed)
Post Partum Day 1 Subjective: no complaints, up ad lib, voiding, tolerating PO, + flatus and patient reports no pain.  She is breastfeeding and states that this is going well.  Denies chest pain, shortness of breath, and leg pain.  She is ready to go home.    Objective: Blood pressure 105/68, pulse 78, temperature 98.2 F (36.8 C), temperature source Oral, resp. rate 18, height 5\' 5"  (1.651 m), weight 131.543 kg (290 lb), SpO2 100.00%, unknown if currently breastfeeding.  Physical Exam:  General: alert, cooperative and no distress Lochia: appropriate Uterine Fundus: firm Incision: n/a DVT Evaluation: No evidence of DVT seen on physical exam. Cardiovascular: normal S1, S2; no murmurs, rubs, or gallops Respiratory: no respiratory distress; lungs clear to auscultation bilaterally   Basename 06/29/11 2325  HGB 11.2*  HCT 33.7*    Assessment/Plan: Discharge home, Breastfeeding and Contraception -patient is interested in oral contraceptives.  Will discuss this at her follow up visit at [facility] in 4-6 weeks.     LOS: 2 days   Ashanti Ratti 07/01/2011, 9:27 AM

## 2011-07-01 NOTE — Progress Notes (Signed)
I  agree with PA student note.  [name] PYG-1  

## 2011-07-01 NOTE — Anesthesia Postprocedure Evaluation (Signed)
  Anesthesia Post-op Note  Patient: [name]  Procedure(s) Performed: * No procedures listed *  Patient Location: Mother/Baby  Anesthesia Type: Epidural  Level of Consciousness: awake, alert  and oriented  Airway and Oxygen Therapy: Patient Spontanous Breathing  Post-op Pain: none  Post-op Assessment: Post-op Vital signs reviewed, Patient's Cardiovascular Status Stable, No headache, No backache, No residual numbness and No residual motor weakness  Post-op Vital Signs: Reviewed and stable  Complications: No apparent anesthesia complications

## 2011-07-01 NOTE — Discharge Summary (Signed)
Obstetric Discharge Summary Reason for Admission: onset of labor Prenatal Procedures: none Intrapartum Procedures: spontaneous vaginal delivery Postpartum Procedures: none Complications-Operative and Postpartum: none Hemoglobin  Date Value Range Status  06/29/2011 11.2* 12.0-15.0 (g/dL) Final     HCT  Date Value Range Status  06/29/2011 33.7* 36.0-46.0 (%) Final    Discharge Diagnoses: Term Pregnancy-delivered  Discharge Information: Date: 07/01/2011 Activity: pelvic rest Diet: routine Medications: Ibuprofen and ortho micronor Condition: stable Instructions: refer to practice specific booklet Discharge to: home Follow-up Information    Follow up with [facility].   Contact information:   [address] [place] [address]          Newborn Data: Live born female  Birth Weight: 7 lb 6 oz (3345 g) APGAR: 9, 9  Home with mother.  Londa Mackowski JEHIEL 07/01/2011, 6:57 AM

## 2011-07-01 NOTE — Progress Notes (Signed)
UR Chart review completed.  

## 2011-07-01 NOTE — Progress Notes (Signed)
PSYCHOSOCIAL ASSESSMENT ~ MATERNAL/CHILD Name: Marlise Eves                                                                            Age: 26  Referral Date:        07/01/11   Reason/Source: History of MJ use/ CN  I. FAMILY/HOME ENVIRONMENT A. Child's Legal Guardian _X__Parent(s) ___Grandparent ___Foster parent ___DSS_________________ Name: Eliezer Bottom                                    DOB: //                     Age: 35   Address: [address] ; [place], [place] [address]  Name:    Bernette Redbird                                   DOB: //                     Age:  79 Address:  B. Other Household Members/Support Persons Name:   Ty-Quell Heredia           Relationship: son                 DOB 09/29/05                   Name:                                         Relationship:                        DOB ___/___/___                   Name:                                         Relationship:                        DOB ___/___/___                   Name:                                         Relationship:                        DOB ___/___/___  C. Other Support:   II. PSYCHOSOCIAL DATA A. Information Source  _X_Patient Interview  __Family Interview           __Other___________  B. Surveyor, quantity and [employer] __Employment: _X_Medicaid    [place]:                 __Private Insurance:                   __Self Pay  _X_Food Stamps   _X_WIC __[employer] First     __Public Housing     _X_Section 8    __Maternity Care Coordination/Child Service Coordination/Early Intervention   ___School:                                                                         Grade:  __Other:   Salena Saner Cultural and Environment Information Cultural Issues Impacting Care:  III. STRENGTHS _X__Supportive family/friends _X__Adequate Resources ___Compliance with medical plan _X__Home prepared for Child (including  basic supplies) ___Understanding of illness      ___Other: RISK FACTORS AND CURRENT PROBLEMS         ____No Problems Noted        History of MJ use                                                                                                                                                                                                                              IV. SOCIAL WORK ASSESSMENT   Pt admits to smoking MJ, "once a week" prior to pregnancy confirmation at 5 weeks.  Once pregnancy was confirmed, pt states she stopped smoking.  Sw informed pt about the hospital drug testing policy.  Pt is confident that the drug screen results will be negative.  UDS and meconium results are pending.  She denies past involvement with CPS.  Pt lives alone with her son.  FOB is at the bedside and supportive.  She has all the necessary supplies for the infant except for a crib or bassinet.  Pt told Sw that pediatrician educated her about the increased risk of SIDS (when co sleeping occurs) and gave other options for sleeping arrangements, until pt is able to purchase a crib.  Pt identified her mother, Olegario Messier, as another  support person.  Pt appears appropriate and attentive to the infant, as this Sw observed.  Sw will follow up with drug screen result and make a referral if needed.    V. SOCIAL WORK PLAN  _X__No Further Intervention Required/No Barriers to Discharge   ___Psychosocial Support and Ongoing Assessment of Needs   ___Patient/Family Education:   ___Child Protective Services Report   County___________ Date___/____/____   ___Information/Referral to [employer]_________________________   ___Other:

## 2011-07-02 ENCOUNTER — Other Ambulatory Visit: Payer: Self-pay

## 2011-07-02 ENCOUNTER — Emergency Department (HOSPITAL_COMMUNITY)
Admission: EM | Admit: 2011-07-02 | Discharge: 2011-07-02 | Disposition: A | Discharge status: Home / Self Care | Payer: Medicaid Other | Attending: Emergency Medicine | Admitting: Emergency Medicine

## 2011-07-02 ENCOUNTER — Encounter (HOSPITAL_COMMUNITY): Payer: Self-pay

## 2011-07-02 DIAGNOSIS — R000 Tachycardia, unspecified: Secondary | ICD-10-CM | POA: Insufficient documentation

## 2011-07-02 DIAGNOSIS — Z87891 Personal history of nicotine dependence: Secondary | ICD-10-CM | POA: Insufficient documentation

## 2011-07-02 DIAGNOSIS — O2390 Unspecified genitourinary tract infection in pregnancy, unspecified trimester: Secondary | ICD-10-CM | POA: Insufficient documentation

## 2011-07-02 DIAGNOSIS — N390 Urinary tract infection, site not specified: Secondary | ICD-10-CM | POA: Insufficient documentation

## 2011-07-02 LAB — URINALYSIS, ROUTINE W REFLEX MICROSCOPIC
Ketones, ur: NEGATIVE mg/dL
Leukocytes, UA: NEGATIVE
Nitrite: NEGATIVE
Protein, ur: NEGATIVE mg/dL
Urobilinogen, UA: 0.2 mg/dL (ref 0.0–1.0)

## 2011-07-02 LAB — URINE CULTURE
Colony Count: NO GROWTH
Culture: NO GROWTH

## 2011-07-02 MED ORDER — CEPHALEXIN 500 MG PO CAPS: 500.0000 mg | ORAL_CAPSULE | Freq: Four times a day (QID) | ORAL | Status: AC

## 2011-07-02 MED ORDER — CEPHALEXIN 500 MG PO CAPS
500.0000 mg | ORAL_CAPSULE | Freq: Once | ORAL | Status: AC
Start: 2011-07-02 — End: 2011-07-02
  Administered 2011-07-02: 500 mg via ORAL
  Filled 2011-07-02: qty 1

## 2011-07-02 NOTE — ED Provider Notes (Signed)
History   This chart was scribed for Barbara Lennert, MD by Charolett Bumpers . The patient was seen in room APA11/APA11 and the patient's care was started at 7:10pm.  CSN: [id number]  Arrival date & time 07/02/11  1651   First MD Initiated Contact with Patient 07/02/11 1809      Chief Complaint  Patient presents with  . Fever  . Chills  . Dizziness  . Hyperventilating    (Consider location/radiation/quality/duration/timing/severity/associated sxs/prior treatment) HPI [name] is a 26 y.o. female who presents to the Emergency Department complaining of an intermittent fever with associated chills that started approximately 4 hours ago. Patient denies sore throat, cough, SOB, abdominal and leg pain. Patient reports that she has not taken anything for her fever. Patient also notes that she gave birth 2 days ago at [facility] and has had a Pertussis vaccine.      Past Medical History  Diagnosis Date  . No pertinent past medical history     Past Surgical History  Procedure Date  . Dilation and curettage of uterus     History reviewed. No pertinent family history.  History  Substance Use Topics  . Smoking status: Former Games developer  . Smokeless tobacco: Never Used  . Alcohol Use: No    OB History    Grav Para Term Preterm Abortions TAB SAB Ect Mult Living   4 2 2  0 2     2      Review of Systems  Constitutional: Positive for fever and chills. Negative for fatigue.  HENT: Negative for congestion, sore throat, sinus pressure and ear discharge.   Eyes: Negative for discharge.  Respiratory: Negative for cough and shortness of breath.   Cardiovascular: Negative for chest pain.  Gastrointestinal: Negative for abdominal pain and diarrhea.  Genitourinary: Negative for frequency and hematuria.  Musculoskeletal: Negative for back pain.       Negative for leg pain.   Skin: Negative for rash.  Neurological: Negative for seizures and headaches.  Hematological:  Negative.   Psychiatric/Behavioral: Negative for hallucinations.    Allergies  Review of patient's allergies indicates no known allergies.  Home Medications   Current Outpatient Rx  Name Route Sig Dispense Refill  . IBUPROFEN 600 MG PO TABS Oral Take 1 tablet (600 mg total) by mouth every 6 (six) hours. 30 tablet 0  . NORETHINDRONE 0.35 MG PO TABS Oral Take 1 tablet (0.35 mg total) by mouth daily. 1 Package 11  . PRENATAL MULTIVITAMIN CH Oral Take 1 tablet by mouth every morning.      BP 103/72  Pulse 100  Temp(Src) 99.2 F (37.3 C) (Oral)  Resp 16  Ht 5\' 6"  (1.676 m)  Wt 290 lb (131.543 kg)  BMI 46.81 kg/m2  SpO2 98%  Breastfeeding? Yes  Physical Exam  Constitutional: She is oriented to person, place, and time. She appears well-developed.  HENT:  Head: Normocephalic and atraumatic.  Eyes: Conjunctivae and EOM are normal. No scleral icterus.  Neck: Neck supple. No thyromegaly present.  Cardiovascular: Regular rhythm.  Exam reveals no gallop and no friction rub.   No murmur heard.      Tachycardiac  Pulmonary/Chest: No stridor. She has no wheezes. She has no rales. She exhibits no tenderness.  Abdominal: She exhibits no distension. There is no tenderness. There is no rebound.  Musculoskeletal: Normal range of motion. She exhibits no edema.  Lymphadenopathy:    She has no cervical adenopathy.  Neurological: She is oriented  to person, place, and time. Coordination normal.  Skin: No rash noted. No erythema.  Psychiatric: She has a normal mood and affect. Her behavior is normal.    ED Course  Procedures (including critical care time)  DIAGNOSTIC STUDIES: Oxygen Saturation is 99% on room air, normal by my interpretation.    COORDINATION OF CARE:  2010: Recheck: Patient's lab results and treatment plan was discussed. Dr. Despina Hidden was paged.    Results for orders placed during the hospital encounter of 07/02/11  URINALYSIS, ROUTINE W REFLEX MICROSCOPIC      Component  Value Range   Color, Urine YELLOW  YELLOW    APPearance CLEAR  CLEAR    Specific Gravity, Urine <1.005 (*) 1.005 - 1.030    pH 7.0  5.0 - 8.0    Glucose, UA NEGATIVE  NEGATIVE (mg/dL)   Hgb urine dipstick SMALL (*) NEGATIVE    Bilirubin Urine NEGATIVE  NEGATIVE    Ketones, ur NEGATIVE  NEGATIVE (mg/dL)   Protein, ur NEGATIVE  NEGATIVE (mg/dL)   Urobilinogen, UA 0.2  0.0 - 1.0 (mg/dL)   Nitrite NEGATIVE  NEGATIVE    Leukocytes, UA NEGATIVE  NEGATIVE   URINE MICROSCOPIC-ADD ON      Component Value Range   Squamous Epithelial / LPF FEW (*) RARE    WBC, UA 3-6  <3 (WBC/hpf)   RBC / HPF 0-2  <3 (RBC/hpf)     No diagnosis found.    MDM  Fever uti      The chart was scribed for me under my direct supervision.  I personally performed the history, physical, and medical decision making and all procedures in the evaluation of this patient.Barbara Lennert, MD 07/02/11 2209

## 2011-07-02 NOTE — ED Notes (Signed)
Pt presents with fever, chills, dizziness, and tachypnea that started today.Pt gave birth Thursday.

## 2011-07-02 NOTE — ED Notes (Signed)
Pt denies pain other than normal cramping post childbirth.

## 2011-07-02 NOTE — ED Notes (Signed)
Gave patient something to drink as requested and approved by MD. 

## 2011-07-02 NOTE — Progress Notes (Signed)
Post Partum Day 2 Subjective: no complaints, up ad lib, voiding, tolerating PO and + flatus  Objective: Blood pressure 131/80, pulse 66, temperature 97.8 F (36.6 C), temperature source Oral, resp. rate 18, height 5\' 5"  (1.651 m), weight 290 lb (131.543 kg), SpO2 99.00%, unknown if currently breastfeeding.  Physical Exam:  General: alert, cooperative and no distress Lochia: appropriate Uterine Fundus: firm Incision: n/a  DVT Evaluation: No evidence of DVT seen on physical exam.   Basename 06/29/11 2325  HGB 11.2*  HCT 33.7*    Assessment/Plan: Discharge home and Breastfeeding Social work consult done... Thank you Motrin for pain PRN Return to clinic at [facility] in 6 weeks.  Plans pills for contraception   LOS: 3 days   [facility] 07/02/2011, 6:14 AM

## 2011-07-14 ENCOUNTER — Emergency Department (HOSPITAL_COMMUNITY): Payer: Medicaid Other

## 2011-07-14 ENCOUNTER — Inpatient Hospital Stay (HOSPITAL_COMMUNITY)
Admission: EM | Admit: 2011-07-14 | Discharge: 2011-07-30 | DRG: 769 | Disposition: A | Discharge status: Home / Self Care | Payer: Medicaid Other | Attending: Obstetrics & Gynecology | Admitting: Obstetrics & Gynecology

## 2011-07-14 ENCOUNTER — Encounter (HOSPITAL_COMMUNITY): Payer: Self-pay | Admitting: Emergency Medicine

## 2011-07-14 DIAGNOSIS — R102 Pelvic and perineal pain: Secondary | ICD-10-CM

## 2011-07-14 DIAGNOSIS — D649 Anemia, unspecified: Secondary | ICD-10-CM | POA: Diagnosis not present

## 2011-07-14 DIAGNOSIS — K560 Paralytic ileus: Secondary | ICD-10-CM | POA: Diagnosis not present

## 2011-07-14 DIAGNOSIS — E669 Obesity, unspecified: Secondary | ICD-10-CM | POA: Diagnosis present

## 2011-07-14 DIAGNOSIS — O85 Puerperal sepsis: Principal | ICD-10-CM | POA: Diagnosis present

## 2011-07-14 DIAGNOSIS — J95821 Acute postprocedural respiratory failure: Secondary | ICD-10-CM | POA: Diagnosis not present

## 2011-07-14 DIAGNOSIS — Z6841 Body Mass Index (BMI) 40.0 and over, adult: Secondary | ICD-10-CM

## 2011-07-14 DIAGNOSIS — O8689 Other specified puerperal infections: Secondary | ICD-10-CM

## 2011-07-14 DIAGNOSIS — O9089 Other complications of the puerperium, not elsewhere classified: Secondary | ICD-10-CM | POA: Diagnosis not present

## 2011-07-14 DIAGNOSIS — N7003 Acute salpingitis and oophoritis: Secondary | ICD-10-CM | POA: Diagnosis present

## 2011-07-14 DIAGNOSIS — J9819 Other pulmonary collapse: Secondary | ICD-10-CM | POA: Diagnosis not present

## 2011-07-14 DIAGNOSIS — O9081 Anemia of the puerperium: Secondary | ICD-10-CM | POA: Diagnosis not present

## 2011-07-14 HISTORY — DX: Pain, unspecified: R52

## 2011-07-14 HISTORY — DX: Other complications of the puerperium, not elsewhere classified: O90.89

## 2011-07-14 LAB — URINALYSIS, ROUTINE W REFLEX MICROSCOPIC
Glucose, UA: NEGATIVE mg/dL
Leukocytes, UA: NEGATIVE
pH: 5.5 (ref 5.0–8.0)

## 2011-07-14 LAB — COMPREHENSIVE METABOLIC PANEL
AST: 26 U/L (ref 0–37)
Albumin: 2 g/dL — ABNORMAL LOW (ref 3.5–5.2)
Calcium: 9.1 mg/dL (ref 8.4–10.5)
Chloride: 108 mEq/L (ref 96–112)
Creatinine, Ser: 1.01 mg/dL (ref 0.50–1.10)
Sodium: 138 mEq/L (ref 135–145)

## 2011-07-14 LAB — DIFFERENTIAL
Eosinophils Absolute: 0 10*3/uL (ref 0.0–0.7)
Eosinophils Relative: 0 % (ref 0–5)
Lymphocytes Relative: 15 % (ref 12–46)
Monocytes Absolute: 0.8 10*3/uL (ref 0.1–1.0)
Neutrophils Relative %: 78 % — ABNORMAL HIGH (ref 43–77)

## 2011-07-14 LAB — CBC
MCH: 26.7 pg (ref 26.0–34.0)
Platelets: 210 10*3/uL (ref 150–400)
RBC: 4.09 MIL/uL (ref 3.87–5.11)

## 2011-07-14 LAB — WET PREP, GENITAL: Yeast Wet Prep HPF POC: NONE SEEN

## 2011-07-14 MED ORDER — ACETAMINOPHEN 325 MG PO TABS
650.0000 mg | ORAL_TABLET | Freq: Once | ORAL | Status: AC
  Administered 2011-07-14: 650 mg via ORAL
  Filled 2011-07-14: qty 2

## 2011-07-14 MED ORDER — ACETAMINOPHEN 325 MG PO TABS
650.0000 mg | ORAL_TABLET | ORAL | Status: DC | PRN
  Administered 2011-07-14 – 2011-07-19 (×7): 650 mg via ORAL
  Filled 2011-07-14 (×8): qty 2

## 2011-07-14 MED ORDER — SODIUM CHLORIDE 0.9 % IV BOLUS (SEPSIS): 500.0000 mL | Freq: Once | INTRAVENOUS | Status: DC

## 2011-07-14 MED ORDER — PRENATAL MULTIVITAMIN CH
1.0000 | ORAL_TABLET | Freq: Every day | ORAL | Status: DC
  Administered 2011-07-15 – 2011-07-23 (×8): 1 via ORAL
  Filled 2011-07-14 (×12): qty 1

## 2011-07-14 MED ORDER — PIPERACILLIN-TAZOBACTAM 3.375 G IVPB
INTRAVENOUS | Status: AC
  Filled 2011-07-14: qty 100

## 2011-07-14 MED ORDER — DOCUSATE SODIUM 100 MG PO CAPS
100.0000 mg | ORAL_CAPSULE | Freq: Two times a day (BID) | ORAL | Status: DC
  Administered 2011-07-14 – 2011-07-23 (×11): 100 mg via ORAL
  Filled 2011-07-14 (×15): qty 1

## 2011-07-14 MED ORDER — SODIUM CHLORIDE 0.9 % IV SOLN
INTRAVENOUS | Status: DC
Start: 2011-07-14 — End: 2011-07-16
  Administered 2011-07-14: 15:00:00 via INTRAVENOUS

## 2011-07-14 MED ORDER — DEXTROSE 5 % IV SOLN
1.0000 g | Freq: Once | INTRAVENOUS | Status: AC
  Administered 2011-07-14: 1 g via INTRAVENOUS
  Filled 2011-07-14: qty 10

## 2011-07-14 MED ORDER — OXYCODONE-ACETAMINOPHEN 5-325 MG PO TABS
1.0000 | ORAL_TABLET | ORAL | Status: DC | PRN
  Administered 2011-07-21: 1 via ORAL
  Administered 2011-07-21 – 2011-07-23 (×3): 2 via ORAL
  Administered 2011-07-29: 1 via ORAL
  Administered 2011-07-29: 2 via ORAL
  Filled 2011-07-14 (×2): qty 2
  Filled 2011-07-14 (×2): qty 1
  Filled 2011-07-14 (×2): qty 2

## 2011-07-14 MED ORDER — PIPERACILLIN-TAZOBACTAM 3.375 G IVPB 30 MIN: 3.3750 g | Freq: Three times a day (TID) | INTRAVENOUS | Status: DC

## 2011-07-14 MED ORDER — FENTANYL CITRATE 0.05 MG/ML IJ SOLN
100.0000 ug | Freq: Once | INTRAMUSCULAR | Status: AC
  Administered 2011-07-14: 100 ug via INTRAVENOUS
  Filled 2011-07-14: qty 2

## 2011-07-14 MED ORDER — HYDROMORPHONE HCL PF 1 MG/ML IJ SOLN
1.0000 mg | Freq: Once | INTRAMUSCULAR | Status: AC
  Administered 2011-07-14: 1 mg via INTRAVENOUS
  Filled 2011-07-14: qty 1

## 2011-07-14 MED ORDER — ZOLPIDEM TARTRATE 5 MG PO TABS
5.0000 mg | ORAL_TABLET | Freq: Every evening | ORAL | Status: DC | PRN
  Administered 2011-07-20: 5 mg via ORAL
  Filled 2011-07-14: qty 1

## 2011-07-14 MED ORDER — IBUPROFEN 800 MG PO TABS
800.0000 mg | ORAL_TABLET | Freq: Three times a day (TID) | ORAL | Status: DC | PRN
  Administered 2011-07-14 – 2011-07-19 (×6): 800 mg via ORAL
  Filled 2011-07-14 (×6): qty 1

## 2011-07-14 MED ORDER — DEXTROSE 5 % IV SOLN: 1.0000 g | Freq: Once | INTRAVENOUS | Status: DC

## 2011-07-14 MED ORDER — ONDANSETRON 8 MG/NS 50 ML IVPB
8.0000 mg | Freq: Four times a day (QID) | INTRAVENOUS | Status: DC | PRN
  Filled 2011-07-14: qty 8

## 2011-07-14 MED ORDER — ONDANSETRON HCL 4 MG/2ML IJ SOLN
4.0000 mg | Freq: Once | INTRAMUSCULAR | Status: AC
  Administered 2011-07-14: 4 mg via INTRAVENOUS
  Filled 2011-07-14: qty 2

## 2011-07-14 MED ORDER — ENOXAPARIN SODIUM 120 MG/0.8ML ~~LOC~~ SOLN
120.0000 mg | Freq: Two times a day (BID) | SUBCUTANEOUS | Status: DC
  Administered 2011-07-14 – 2011-07-17 (×6): 120 mg via SUBCUTANEOUS
  Filled 2011-07-14 (×8): qty 0.8

## 2011-07-14 MED ORDER — ENOXAPARIN SODIUM 120 MG/0.8ML ~~LOC~~ SOLN
SUBCUTANEOUS | Status: AC
  Filled 2011-07-14: qty 0.8

## 2011-07-14 MED ORDER — ONDANSETRON HCL 4 MG PO TABS
8.0000 mg | ORAL_TABLET | Freq: Four times a day (QID) | ORAL | Status: DC | PRN
  Filled 2011-07-14: qty 2

## 2011-07-14 MED ORDER — HYDROMORPHONE HCL PF 1 MG/ML IJ SOLN
1.0000 mg | INTRAMUSCULAR | Status: DC | PRN
  Administered 2011-07-15: 1 mg via INTRAVENOUS
  Administered 2011-07-15: 2 mg via INTRAVENOUS
  Administered 2011-07-15: 1 mg via INTRAVENOUS
  Filled 2011-07-14 (×2): qty 2
  Filled 2011-07-14 (×2): qty 1

## 2011-07-14 MED ORDER — PIPERACILLIN-TAZOBACTAM 3.375 G IVPB
3.3750 g | Freq: Three times a day (TID) | INTRAVENOUS | Status: DC
  Administered 2011-07-14 – 2011-07-18 (×11): 3.375 g via INTRAVENOUS
  Filled 2011-07-14 (×15): qty 50

## 2011-07-14 MED ORDER — KCL IN DEXTROSE-NACL 20-5-0.45 MEQ/L-%-% IV SOLN
INTRAVENOUS | Status: DC
  Administered 2011-07-14: 19:00:00 via INTRAVENOUS
  Administered 2011-07-15: 125 mL/h via INTRAVENOUS
  Administered 2011-07-16 – 2011-07-18 (×6): via INTRAVENOUS

## 2011-07-14 NOTE — ED Notes (Signed)
Assisted with pelvic  °

## 2011-07-14 NOTE — ED Provider Notes (Signed)
History    This chart was scribed for [name], MD, MD by [name]. The patient was seen in room APA11 and the patient's care was started at 2:09PM.   CSN: [id number]  Arrival date & time 07/14/11  1347   First MD Initiated Contact with Patient 07/14/11 1403      Chief Complaint  Patient presents with  . Abdominal Pain    (Consider location/radiation/quality/duration/timing/severity/associated sxs/prior treatment) Patient is a 26 y.o. female presenting with abdominal pain. The history is provided by the patient.  Abdominal Pain The primary symptoms of the illness include abdominal pain.   [name] is a 26 y.o. female who presents to the Emergency Department complaining of moderate, sharp lower abdominal pain onset 12 days ago. Pain has been constant since vaginal delivery 12 days ago. Pt has seen ob/gyn this week and last week. Pt was given Cipro 500 BID. Denies cough and headache. Pt reports having fever, nausea and vomiting. She had dysuria today and has had bleeding since pregnancy. Pt was taking hydrocodone and finished them. Last bowel movement was today. Pain is aggravated by standing up. Pt has delivered 2 children and was pregnant 4 times.    Past Medical History  Diagnosis Date  . No pertinent past medical history   . Postpartum pain 06/30/2011    Vaginal delivery, moderate bleeding, tender in abd, esp LLQ    Past Surgical History  Procedure Date  . Dilation and curettage of uterus     History reviewed. No pertinent family history.  History  Substance Use Topics  . Smoking status: Former Smoker -- 0.2 packs/day for 1 years    Types: Cigarettes  . Smokeless tobacco: Never Used  . Alcohol Use: No    OB History    Grav Para Term Preterm Abortions TAB SAB Ect Mult Living   4 2 2  0 2     2      Review of Systems  Gastrointestinal: Positive for abdominal pain.  All other systems reviewed and are negative.   10 Systems reviewed and are negative  for acute change except as noted in the HPI.  Allergies  Review of patient's allergies indicates no known allergies.  Home Medications   No current outpatient prescriptions on file.  BP 142/77  Pulse 128  Temp(Src) 99.2 F (37.3 C) (Oral)  Resp 20  Ht 5\' 6"  (1.676 m)  Wt 286 lb 6 oz (129.9 kg)  BMI 46.22 kg/m2  SpO2 95%  LMP 06/30/2011  Breastfeeding? Yes  Physical Exam  Nursing note and vitals reviewed. Constitutional: She is oriented to person, place, and time. No distress.  HENT:  Head: Normocephalic and atraumatic.  Mouth/Throat: Oropharynx is clear and moist.  Eyes: Conjunctivae are normal. Pupils are equal, round, and reactive to light.  Neck: Neck supple. No thyromegaly present.  Cardiovascular: Tachycardia present.   Murmur heard. Pulmonary/Chest: Effort normal and breath sounds normal. No respiratory distress.  Abdominal: Soft. Bowel sounds are normal. There is tenderness (moderate left and right lower quadrant ).  Genitourinary:       There is a nonspecified type of discharge on the perineum. The labia appear normal. Vaginal discharge is present and is slightly purulent in appearance. There is discharge in the cervical os is unclear if it is extruding from the os. There is mild cervical motion tenderness and diffuse adnexal tenderness. Uterus and ovaries are not palpable; the patient is very obese and the examination was limited. The cervical  os was closed. There is no blood in the vagina  Musculoskeletal: Normal range of motion. She exhibits no tenderness.  Neurological: She is alert and oriented to person, place, and time.  Skin: Skin is warm and dry.  Psychiatric: She has a normal mood and affect. Her behavior is normal.    ED Course  Procedures (including critical care time)  DIAGNOSTIC STUDIES: Oxygen Saturation is 99% on room air, normal by my interpretation.    COORDINATION OF CARE:  2:15PM EDP discusses pt ED course treatment with pt 2:15PM EDP  ordered medication: sublimaze, zofran   Labs Reviewed  CBC - Abnormal; Notable for the following:    WBC 12.1 (*)    Hemoglobin 10.9 (*)    HCT 31.9 (*)    RDW 15.7 (*)    All other components within normal limits  DIFFERENTIAL - Abnormal; Notable for the following:    Neutrophils Relative 78 (*)    Neutro Abs 9.5 (*)    All other components within normal limits  COMPREHENSIVE METABOLIC PANEL - Abnormal; Notable for the following:    Potassium 3.3 (*)    Glucose, Bld 127 (*)    Albumin 2.0 (*)    Alkaline Phosphatase 191 (*)    GFR calc non Af Amer 76 (*)    GFR calc Af Amer 88 (*)    All other components within normal limits  WET PREP, GENITAL - Abnormal; Notable for the following:    WBC, Wet Prep HPF POC TOO NUMEROUS TO COUNT (*)    All other components within normal limits  CBC - Abnormal; Notable for the following:    WBC 22.8 (*)    RBC 3.45 (*)    Hemoglobin 9.3 (*)    HCT 27.0 (*)    RDW 15.8 (*)    All other components within normal limits  DIFFERENTIAL - Abnormal; Notable for the following:    Neutrophils Relative 85 (*)    Neutro Abs 19.5 (*)    Lymphocytes Relative 10 (*)    All other components within normal limits  CBC - Abnormal; Notable for the following:    WBC 24.3 (*)    RBC 3.62 (*)    Hemoglobin 9.8 (*)    HCT 28.5 (*)    RDW 15.9 (*)    All other components within normal limits  DIFFERENTIAL - Abnormal; Notable for the following:    Neutrophils Relative 87 (*)    Neutro Abs 21.1 (*)    Lymphocytes Relative 9 (*)    All other components within normal limits  LACTIC ACID, PLASMA  URINALYSIS, ROUTINE W REFLEX MICROSCOPIC  URINE CULTURE  GC/CHLAMYDIA PROBE AMP, GENITAL  HCG, QUANTITATIVE, PREGNANCY  CULTURE, BLOOD (ROUTINE X 2)  CULTURE, BLOOD (ROUTINE X 2)   US Transvaginal Non-ob  07/14/2011  *RADIOLOGY REPORT*  Clinical Data:  2 weeks post delivery with pelvic pain  TRANSABDOMINAL AND TRANSVAGINAL ULTRASOUND OF PELVIS DOPPLER ULTRASOUND  OF OVARIES  Technique:  Both transabdominal and transvaginal ultrasound examinations of the pelvis were performed. Transabdominal technique was performed for global imaging of the pelvis including uterus, ovaries, adnexal regions, and pelvic cul-de-sac.  It was necessary to proceed with endovaginal exam following the transabdominal exam to visualize the adnexa.  Color and duplex Doppler ultrasound was utilized to evaluate blood flow to the ovaries.  Comparison:  11/10/2010  Findings:  Uterus:  Is enlarged with a sagittal length of 12.9 cm, depth of 7.0 cm and width of 8.4 cm.  Size is compatible with the patient's postpartum status.  No focal abnormalities are seen  Endometrium:  Is poorly defined and difficult to measure.  No abnormal flow is identified.  I suspect the appearance may be the result of some residual blood products within the endometrial canal this soon postpartum.  Right ovary: Has a normal appearance measuring 2.4 x 1.4 x 1.9 cm.  Left ovary:   A normal appearing left ovary is not visualized.  In the left adnexa there is a lobulated soft tissue mass identified which measures 5.3 x 6.3 x 6.4 cm.  This contains a small amount of flow peripherally but no central flow. This is felt unlikely to represent a subserosal or broad ligament fibroid as no fibroid was present on early obstetrical ultrasound performed in 2012.  A moderate amount of complex fluid is identified in the cul-de-sac, upper pelvis and right adnexal region.  Pulsed Doppler evaluation demonstrates normal low-resistance arterial and venous waveforms in the right ovary.  IMPRESSION: Lobulated soft tissue mass with peripheral vascularity seen in the left adnexa with associated complex pelvic fluid.  The finding is suspicious in the appropriate clinical setting for ovarian torsion with associated hemoperitoneum.  Normal postpartum appearance to the uterus.  Normal right ovary  This report was personally called and discussed with the treating  ER physician at 4:17 pm on 07/14/2011.  Original Report Authenticated By: Bertha Stakes, M.D.   US Pelvis Complete  07/14/2011  *RADIOLOGY REPORT*  Clinical Data:  2 weeks post delivery with pelvic pain  TRANSABDOMINAL AND TRANSVAGINAL ULTRASOUND OF PELVIS DOPPLER ULTRASOUND OF OVARIES  Technique:  Both transabdominal and transvaginal ultrasound examinations of the pelvis were performed. Transabdominal technique was performed for global imaging of the pelvis including uterus, ovaries, adnexal regions, and pelvic cul-de-sac.  It was necessary to proceed with endovaginal exam following the transabdominal exam to visualize the adnexa.  Color and duplex Doppler ultrasound was utilized to evaluate blood flow to the ovaries.  Comparison:  11/10/2010  Findings:  Uterus:  Is enlarged with a sagittal length of 12.9 cm, depth of 7.0 cm and width of 8.4 cm.  Size is compatible with the patient's postpartum status.  No focal abnormalities are seen  Endometrium:  Is poorly defined and difficult to measure.  No abnormal flow is identified.  I suspect the appearance may be the result of some residual blood products within the endometrial canal this soon postpartum.  Right ovary: Has a normal appearance measuring 2.4 x 1.4 x 1.9 cm.  Left ovary:   A normal appearing left ovary is not visualized.  In the left adnexa there is a lobulated soft tissue mass identified which measures 5.3 x 6.3 x 6.4 cm.  This contains a small amount of flow peripherally but no central flow. This is felt unlikely to represent a subserosal or broad ligament fibroid as no fibroid was present on early obstetrical ultrasound performed in 2012.  A moderate amount of complex fluid is identified in the cul-de-sac, upper pelvis and right adnexal region.  Pulsed Doppler evaluation demonstrates normal low-resistance arterial and venous waveforms in the right ovary.  IMPRESSION: Lobulated soft tissue mass with peripheral vascularity seen in the left adnexa  with associated complex pelvic fluid.  The finding is suspicious in the appropriate clinical setting for ovarian torsion with associated hemoperitoneum.  Normal postpartum appearance to the uterus.  Normal right ovary  This report was personally called and discussed with the treating ER physician at 4:17 pm on  07/14/2011.  Original Report Authenticated By: Bertha Stakes, M.D.   [place] Art/ven Flow Abd Pelv Doppler  07/14/2011  *RADIOLOGY REPORT*  Clinical Data:  2 weeks post delivery with pelvic pain  TRANSABDOMINAL AND TRANSVAGINAL ULTRASOUND OF PELVIS DOPPLER ULTRASOUND OF OVARIES  Technique:  Both transabdominal and transvaginal ultrasound examinations of the pelvis were performed. Transabdominal technique was performed for global imaging of the pelvis including uterus, ovaries, adnexal regions, and pelvic cul-de-sac.  It was necessary to proceed with endovaginal exam following the transabdominal exam to visualize the adnexa.  Color and duplex Doppler ultrasound was utilized to evaluate blood flow to the ovaries.  Comparison:  11/10/2010  Findings:  Uterus:  Is enlarged with a sagittal length of 12.9 cm, depth of 7.0 cm and width of 8.4 cm.  Size is compatible with the patient's postpartum status.  No focal abnormalities are seen  Endometrium:  Is poorly defined and difficult to measure.  No abnormal flow is identified.  I suspect the appearance may be the result of some residual blood products within the endometrial canal this soon postpartum.  Right ovary: Has a normal appearance measuring 2.4 x 1.4 x 1.9 cm.  Left ovary:   A normal appearing left ovary is not visualized.  In the left adnexa there is a lobulated soft tissue mass identified which measures 5.3 x 6.3 x 6.4 cm.  This contains a small amount of flow peripherally but no central flow. This is felt unlikely to represent a subserosal or broad ligament fibroid as no fibroid was present on early obstetrical ultrasound performed in 2012.  A moderate  amount of complex fluid is identified in the cul-de-sac, upper pelvis and right adnexal region.  Pulsed Doppler evaluation demonstrates normal low-resistance arterial and venous waveforms in the right ovary.  IMPRESSION: Lobulated soft tissue mass with peripheral vascularity seen in the left adnexa with associated complex pelvic fluid.  The finding is suspicious in the appropriate clinical setting for ovarian torsion with associated hemoperitoneum.  Normal postpartum appearance to the uterus.  Normal right ovary  This report was personally called and discussed with the treating ER physician at 4:17 pm on 07/14/2011.  Original Report Authenticated By: Bertha Stakes, M.D.     1. Pelvic pain       MDM  Pelvic pain, post vaginal delivery, persistent for 2 weeks. Pelvic examination is abnormal. An ultrasound was ordered  I personally performed the services described in this documentation, which was scribed in my presence. The recorded information has been reviewed and considered.         [name], MD 07/16/11 [phone]

## 2011-07-14 NOTE — ED Provider Notes (Signed)
I assumed care of this patient at 3:30 PM from Dr. Effie Shy. Patient is 12 days postpartum with lower abdominal pain and vaginal discharge. She seen by Dr. Despina Hidden days ago and given Cipro for possible endometritis.  Ultrasound results pending at time of signout. On reassessment patient is uncomfortable with left lower quadrant and suprapubic pain  I was called by the radiologist regarding the ultrasound findings and concern for possible torsion. I discussed with Dr. Despina Hidden who doubts ovarian torsion and favors ovarian cystic complex.  Recommends rocephin (which was already given) and follow up on Monday.    On reassessment, patient is uncomfortable with guarding to LLQ.  States she in unable to stand up or walk.  D/w Dr. Despina Hidden again.  With US findings and patient's exam, I don't feel comfortable discharging her in this condition.  He agrees to admit her for observation.   Glynn Octave, MD 07/14/11 [phone]

## 2011-07-14 NOTE — ED Notes (Signed)
Patient transported to Ultrasound [name]

## 2011-07-14 NOTE — ED Notes (Signed)
Pt recently gave birth, seen here on the 19th for fever, chills N/V.  Pt here for sharp lower abd pain that has been persist ant since birth.  Pt seen by PCP and placed on antibiotics for ? infection

## 2011-07-14 NOTE — ED Notes (Signed)
Patient transported to Ultrasound 

## 2011-07-15 LAB — CBC
HCT: 27 % — ABNORMAL LOW (ref 36.0–46.0)
MCV: 78.3 fL (ref 78.0–100.0)
Platelets: 247 10*3/uL (ref 150–400)
RBC: 3.45 MIL/uL — ABNORMAL LOW (ref 3.87–5.11)
RBC: 3.62 MIL/uL — ABNORMAL LOW (ref 3.87–5.11)
WBC: 22.8 10*3/uL — ABNORMAL HIGH (ref 4.0–10.5)
WBC: 24.3 10*3/uL — ABNORMAL HIGH (ref 4.0–10.5)

## 2011-07-15 LAB — DIFFERENTIAL
Eosinophils Relative: 0 % (ref 0–5)
Lymphocytes Relative: 10 % — ABNORMAL LOW (ref 12–46)
Lymphocytes Relative: 9 % — ABNORMAL LOW (ref 12–46)
Lymphs Abs: 2.3 10*3/uL (ref 0.7–4.0)
Lymphs Abs: 2.2 10*3/uL (ref 0.7–4.0)
Monocytes Absolute: 1 10*3/uL (ref 0.1–1.0)
Neutrophils Relative %: 87 % — ABNORMAL HIGH (ref 43–77)

## 2011-07-15 MED ORDER — NALOXONE HCL 0.4 MG/ML IJ SOLN: 0.4000 mg | INTRAMUSCULAR | Status: DC | PRN

## 2011-07-15 MED ORDER — HYDROMORPHONE 0.3 MG/ML IV SOLN
INTRAVENOUS | Status: AC
  Filled 2011-07-15: qty 25

## 2011-07-15 MED ORDER — SODIUM CHLORIDE 0.9 % IJ SOLN
9.0000 mL | INTRAMUSCULAR | Status: DC | PRN
  Filled 2011-07-15 (×2): qty 3

## 2011-07-15 MED ORDER — DIPHENHYDRAMINE HCL 50 MG/ML IJ SOLN: 12.5000 mg | Freq: Four times a day (QID) | INTRAMUSCULAR | Status: DC | PRN

## 2011-07-15 MED ORDER — ONDANSETRON HCL 4 MG/2ML IJ SOLN
4.0000 mg | Freq: Four times a day (QID) | INTRAMUSCULAR | Status: DC | PRN
  Administered 2011-07-18 – 2011-07-27 (×20): 4 mg via INTRAVENOUS
  Filled 2011-07-15 (×18): qty 2

## 2011-07-15 MED ORDER — ENOXAPARIN SODIUM 120 MG/0.8ML ~~LOC~~ SOLN
SUBCUTANEOUS | Status: AC
  Filled 2011-07-15: qty 0.8

## 2011-07-15 MED ORDER — DIPHENHYDRAMINE HCL 12.5 MG/5ML PO ELIX
12.5000 mg | ORAL_SOLUTION | Freq: Four times a day (QID) | ORAL | Status: DC | PRN
  Filled 2011-07-15: qty 5

## 2011-07-15 MED ORDER — HYDROMORPHONE 0.3 MG/ML IV SOLN
INTRAVENOUS | Status: DC
  Administered 2011-07-15: 0.3 mL via INTRAVENOUS
  Administered 2011-07-15: 2.9 mg via INTRAVENOUS
  Administered 2011-07-15: 12:00:00 via INTRAVENOUS
  Administered 2011-07-16: 2.1 mg via INTRAVENOUS
  Administered 2011-07-16: 1.2 mg via INTRAVENOUS
  Administered 2011-07-16: 12:00:00 via INTRAVENOUS
  Administered 2011-07-17: 1.74 mg via INTRAVENOUS
  Administered 2011-07-17 (×2): via INTRAVENOUS
  Administered 2011-07-17: 2.4 mg via INTRAVENOUS
  Administered 2011-07-17: 2.7 mg via INTRAVENOUS
  Administered 2011-07-18: 19:00:00 via INTRAVENOUS
  Administered 2011-07-18: 1.8 mg via INTRAVENOUS
  Administered 2011-07-18: 4.8 mg via INTRAVENOUS
  Administered 2011-07-18: 17:00:00 via INTRAVENOUS
  Administered 2011-07-19: 1.5 mg via INTRAVENOUS
  Administered 2011-07-19: 3 mg via INTRAVENOUS
  Administered 2011-07-19: 3.12 mg via INTRAVENOUS
  Administered 2011-07-19: 07:00:00 via INTRAVENOUS
  Administered 2011-07-19: 7.5 mg via INTRAVENOUS
  Administered 2011-07-20: 3.3 mg via INTRAVENOUS
  Administered 2011-07-20: 08:00:00 via INTRAVENOUS
  Administered 2011-07-20: 3.3 mg via INTRAVENOUS
  Administered 2011-07-20: 1.5 mg via INTRAVENOUS
  Administered 2011-07-20: 2.1 mg via INTRAVENOUS
  Administered 2011-07-20: 3.3 mg via INTRAVENOUS

## 2011-07-15 NOTE — H&P (Signed)
[name] is an 26 y.o. female.[id number] 3 weeks post partum from a vaginal delivery.  Within 1 week she was having fever up to 103 with no constitutional or localizing symptoms.  Patient has been breastfeeding but had no breast complaints or findings.  Really she had no findings.  Her WBC was 11k.  She has been seen essentially weekly and was treated with keflex for possible mastitis, no real findings just suspicious because of fever curve.  Again patient did not develop symptoms.  Then patient had a touch of lower abdominal pain and i switched to Cipro for a possible endometritis although was unimpressive for that, then patient had finally a localizing symptom yesterday with severe LLQ pain.  WBC still pretty unimpressive.  Sonogram reveals a process in the LLQ with absent flow, radiology suspicious for torsion.  i am not saying patient cannot have torsion, however with this hectic fever essentially for 3 weeks now the localizing symptoms, i am going to treat for septic pelvic thrombophlebitis with zosyn and lovenox and then, if does not respond, proceed with laparoscopic surgery.  Patient's last menstrual period was 06/30/2011.    Past Medical History  Diagnosis Date  . No pertinent past medical history   . Postpartum pain 06/30/2011    Vaginal delivery, moderate bleeding, tender in abd, esp LLQ    Past Surgical History  Procedure Date  . Dilation and curettage of uterus     History reviewed. No pertinent family history.  Social History:  reports that she has quit smoking. Her smoking use included Cigarettes. She has a .25 pack-year smoking history. She has never used smokeless tobacco. She reports that she does not drink alcohol or use illicit drugs.  Allergies: No Known Allergies  Prescriptions prior to admission  Medication Sig Dispense Refill  . acetaminophen (TYLENOL) 500 MG tablet Take 1,000 mg by mouth every 6 (six) hours as needed. Pain      . ciprofloxacin (CIPRO) 500 MG  tablet Take 500 mg by mouth 2 (two) times daily.      . Prenatal Vit-Fe Fumarate-FA (PRENATAL MULTIVITAMIN) TABS Take 1 tablet by mouth every morning.        ROS  Review of Systems  Constitutional: Negative for fever, chills, weight loss, malaise/fatigue and diaphoresis.  HENT: Negative for hearing loss, ear pain, nosebleeds, congestion, sore throat, neck pain, tinnitus and ear discharge.   Eyes: Negative for blurred vision, double vision, photophobia, pain, discharge and redness.  Respiratory: Negative for cough, hemoptysis, sputum production, shortness of breath, wheezing and stridor.   Cardiovascular: Negative for chest pain, palpitations, orthopnea, claudication, leg swelling and PND.  Gastrointestinal: Positive for abdominal pain, LLQ. Negative for heartburn, nausea, vomiting, diarrhea, constipation, blood in stool and melena.  Genitourinary: Negative for dysuria, urgency, frequency, hematuria and flank pain.  Musculoskeletal: Negative for myalgias, back pain, joint pain and falls.  Skin: Negative for itching and rash.  Neurological: Negative for dizziness, tingling, tremors, sensory change, speech change, focal weakness, seizures, loss of consciousness, weakness and headaches.  Endo/Heme/Allergies: Negative for environmental allergies and polydipsia. Does not bruise/bleed easily.  Psychiatric/Behavioral: Negative for depression, suicidal ideas, hallucinations, memory loss and substance abuse. The patient is not nervous/anxious and does not have insomnia.      Blood pressure 122/73, pulse 98, temperature 98.3 F (36.8 C), temperature source Oral, resp. rate 20, height 5\' 6"  (1.676 m), weight 278 lb 10.6 oz (126.4 kg), last menstrual period 06/30/2011, SpO2 94.00%, currently breastfeeding. Physical Exam Physical Exam  Vitals reviewed. Constitutional: She is oriented to person, place, and time. She appears well-developed and well-nourished.  HENT:  Head: Normocephalic and atraumatic.    Right Ear: External ear normal.  Left Ear: External ear normal.  Nose: Nose normal.  Mouth/Throat: Oropharynx is clear and moist.  Eyes: Conjunctivae and EOM are normal. Pupils are equal, round, and reactive to light. Right eye exhibits no discharge. Left eye exhibits no discharge. No scleral icterus.  Neck: Normal range of motion. Neck supple. No tracheal deviation present. No thyromegaly present.  Cardiovascular: Normal rate, regular rhythm, normal heart sounds and intact distal pulses.  Exam reveals no gallop and no friction rub.   No murmur heard. Respiratory: Effort normal and breath sounds normal. No respiratory distress. She has no wheezes. She has no rales. She exhibits no tenderness.  GI: Soft. Bowel sounds are normal. She exhibits no distension and no mass. There is tenderness, sever in the LLQ. There is no rebound but voluntary guarding.  Genitourinary:       Vulva is normal without lesions Vagina is pink moist without discharge Cervix normal in appearance and pap is normal Uterus is enlarged to 12 weeks size due to post partum changes Adnexa is sonogram findings Musculoskeletal: Normal range of motion. She exhibits no edema and no tenderness.  Neurological: She is alert and oriented to person, place, and time. She has normal reflexes. She displays normal reflexes. No cranial nerve deficit. She exhibits normal muscle tone. Coordination normal.  Skin: Skin is warm and dry. No rash noted. No erythema. No pallor.  Psychiatric: She has a normal mood and affect. Her behavior is normal. Judgment and thought content normal.     Results for orders placed during the hospital encounter of 07/14/11 (from the past 24 hour(s))  CBC     Status: Abnormal   Collection Time   07/14/11  2:26 PM      Component Value Range   WBC 12.1 (*) 4.0 - 10.5 (K/uL)   RBC 4.09  3.87 - 5.11 (MIL/uL)   Hemoglobin 10.9 (*) 12.0 - 15.0 (g/dL)   HCT 28.4 (*) 13.2 - 46.0 (%)   MCV 78.0  78.0 - 100.0 (fL)    MCH 26.7  26.0 - 34.0 (pg)   MCHC 34.2  30.0 - 36.0 (g/dL)   RDW 44.0 (*) 10.2 - 15.5 (%)   Platelets 210  150 - 400 (K/uL)  DIFFERENTIAL     Status: Abnormal   Collection Time   07/14/11  2:26 PM      Component Value Range   Neutrophils Relative 78 (*) 43 - 77 (%)   Lymphocytes Relative 15  12 - 46 (%)   Monocytes Relative 7  3 - 12 (%)   Eosinophils Relative 0  0 - 5 (%)   Basophils Relative 0  0 - 1 (%)   Neutro Abs 9.5 (*) 1.7 - 7.7 (K/uL)   Lymphs Abs 1.8  0.7 - 4.0 (K/uL)   Monocytes Absolute 0.8  0.1 - 1.0 (K/uL)   Eosinophils Absolute 0.0  0.0 - 0.7 (K/uL)   Basophils Absolute 0.0  0.0 - 0.1 (K/uL)   WBC Morphology ATYPICAL LYMPHOCYTES     Smear Review LARGE PLATELETS PRESENT    COMPREHENSIVE METABOLIC PANEL     Status: Abnormal   Collection Time   07/14/11  2:26 PM      Component Value Range   Sodium 138  135 - 145 (mEq/L)   Potassium 3.3 (*) 3.5 -  5.1 (mEq/L)   Chloride 108  96 - 112 (mEq/L)   CO2 22  19 - 32 (mEq/L)   Glucose, Bld 127 (*) 70 - 99 (mg/dL)   BUN 11  6 - 23 (mg/dL)   Creatinine, Ser 1.61  0.50 - 1.10 (mg/dL)   Calcium 9.1  8.4 - 09.6 (mg/dL)   Total Protein 6.8  6.0 - 8.3 (g/dL)   Albumin 2.0 (*) 3.5 - 5.2 (g/dL)   AST 26  0 - 37 (U/L)   ALT 21  0 - 35 (U/L)   Alkaline Phosphatase 191 (*) 39 - 117 (U/L)   Total Bilirubin 0.9  0.3 - 1.2 (mg/dL)   GFR calc non Af Amer 76 (*) >90 (mL/min)   GFR calc Af Amer 88 (*) >90 (mL/min)  HCG, QUANTITATIVE, PREGNANCY     Status: Normal   Collection Time   07/14/11  2:26 PM      Component Value Range   hCG, Beta Chain, Quant, S <1  <5 (mIU/mL)  LACTIC ACID, PLASMA     Status: Normal   Collection Time   07/14/11  2:28 PM      Component Value Range   Lactic Acid, Venous 1.4  0.5 - 2.2 (mmol/L)  URINALYSIS, ROUTINE W REFLEX MICROSCOPIC     Status: Normal   Collection Time   07/14/11  2:42 PM      Component Value Range   Color, Urine YELLOW  YELLOW    APPearance CLEAR  CLEAR    Specific Gravity, Urine 1.020   1.005 - 1.030    pH 5.5  5.0 - 8.0    Glucose, UA NEGATIVE  NEGATIVE (mg/dL)   Hgb urine dipstick NEGATIVE  NEGATIVE    Bilirubin Urine NEGATIVE  NEGATIVE    Ketones, ur NEGATIVE  NEGATIVE (mg/dL)   Protein, ur NEGATIVE  NEGATIVE (mg/dL)   Urobilinogen, UA 0.2  0.0 - 1.0 (mg/dL)   Nitrite NEGATIVE  NEGATIVE    Leukocytes, UA NEGATIVE  NEGATIVE   WET PREP, GENITAL     Status: Abnormal   Collection Time   07/14/11  2:56 PM      Component Value Range   Yeast Wet Prep HPF POC NONE SEEN  NONE SEEN    Trich, Wet Prep NONE SEEN  NONE SEEN    Clue Cells Wet Prep HPF POC NONE SEEN  NONE SEEN    WBC, Wet Prep HPF POC TOO NUMEROUS TO COUNT (*) NONE SEEN   GC/CHLAMYDIA PROBE AMP, GENITAL     Status: Normal   Collection Time   07/14/11  2:56 PM      Component Value Range   GC Probe Amp, Genital NEGATIVE  NEGATIVE    Chlamydia, DNA Probe NEGATIVE  NEGATIVE   CBC     Status: Abnormal   Collection Time   07/15/11  5:33 AM      Component Value Range   WBC 22.8 (*) 4.0 - 10.5 (K/uL)   RBC 3.45 (*) 3.87 - 5.11 (MIL/uL)   Hemoglobin 9.3 (*) 12.0 - 15.0 (g/dL)   HCT 04.5 (*) 40.9 - 46.0 (%)   MCV 78.3  78.0 - 100.0 (fL)   MCH 27.0  26.0 - 34.0 (pg)   MCHC 34.4  30.0 - 36.0 (g/dL)   RDW 81.1 (*) 91.4 - 15.5 (%)   Platelets 207  150 - 400 (K/uL)  DIFFERENTIAL     Status: Abnormal   Collection Time   07/15/11  5:33 AM  Component Value Range   Neutrophils Relative 85 (*) 43 - 77 (%)   Neutro Abs 19.5 (*) 1.7 - 7.7 (K/uL)   Lymphocytes Relative 10 (*) 12 - 46 (%)   Lymphs Abs 2.3  0.7 - 4.0 (K/uL)   Monocytes Relative 4  3 - 12 (%)   Monocytes Absolute 1.0  0.1 - 1.0 (K/uL)   Eosinophils Relative 0  0 - 5 (%)   Eosinophils Absolute 0.0  0.0 - 0.7 (K/uL)   Basophils Relative 0  0 - 1 (%)   Basophils Absolute 0.0  0.0 - 0.1 (K/uL)   WBC Morphology INCREASED BANDS (>20% BANDS)      US Transvaginal Non-ob  07/14/2011  *RADIOLOGY REPORT*  Clinical Data:  2 weeks post delivery with pelvic pain   TRANSABDOMINAL AND TRANSVAGINAL ULTRASOUND OF PELVIS DOPPLER ULTRASOUND OF OVARIES  Technique:  Both transabdominal and transvaginal ultrasound examinations of the pelvis were performed. Transabdominal technique was performed for global imaging of the pelvis including uterus, ovaries, adnexal regions, and pelvic cul-de-sac.  It was necessary to proceed with endovaginal exam following the transabdominal exam to visualize the adnexa.  Color and duplex Doppler ultrasound was utilized to evaluate blood flow to the ovaries.  Comparison:  11/10/2010  Findings:  Uterus:  Is enlarged with a sagittal length of 12.9 cm, depth of 7.0 cm and width of 8.4 cm.  Size is compatible with the patient's postpartum status.  No focal abnormalities are seen  Endometrium:  Is poorly defined and difficult to measure.  No abnormal flow is identified.  I suspect the appearance may be the result of some residual blood products within the endometrial canal this soon postpartum.  Right ovary: Has a normal appearance measuring 2.4 x 1.4 x 1.9 cm.  Left ovary:   A normal appearing left ovary is not visualized.  In the left adnexa there is a lobulated soft tissue mass identified which measures 5.3 x 6.3 x 6.4 cm.  This contains a small amount of flow peripherally but no central flow. This is felt unlikely to represent a subserosal or broad ligament fibroid as no fibroid was present on early obstetrical ultrasound performed in 2012.  A moderate amount of complex fluid is identified in the cul-de-sac, upper pelvis and right adnexal region.  Pulsed Doppler evaluation demonstrates normal low-resistance arterial and venous waveforms in the right ovary.  IMPRESSION: Lobulated soft tissue mass with peripheral vascularity seen in the left adnexa with associated complex pelvic fluid.  The finding is suspicious in the appropriate clinical setting for ovarian torsion with associated hemoperitoneum.  Normal postpartum appearance to the uterus.  Normal right  ovary  This report was personally called and discussed with the treating ER physician at 4:17 pm on 07/14/2011.  Original Report Authenticated By: Bertha Stakes, M.D.   US Pelvis Complete  07/14/2011  *RADIOLOGY REPORT*  Clinical Data:  2 weeks post delivery with pelvic pain  TRANSABDOMINAL AND TRANSVAGINAL ULTRASOUND OF PELVIS DOPPLER ULTRASOUND OF OVARIES  Technique:  Both transabdominal and transvaginal ultrasound examinations of the pelvis were performed. Transabdominal technique was performed for global imaging of the pelvis including uterus, ovaries, adnexal regions, and pelvic cul-de-sac.  It was necessary to proceed with endovaginal exam following the transabdominal exam to visualize the adnexa.  Color and duplex Doppler ultrasound was utilized to evaluate blood flow to the ovaries.  Comparison:  11/10/2010  Findings:  Uterus:  Is enlarged with a sagittal length of 12.9 cm, depth of 7.0 cm  and width of 8.4 cm.  Size is compatible with the patient's postpartum status.  No focal abnormalities are seen  Endometrium:  Is poorly defined and difficult to measure.  No abnormal flow is identified.  I suspect the appearance may be the result of some residual blood products within the endometrial canal this soon postpartum.  Right ovary: Has a normal appearance measuring 2.4 x 1.4 x 1.9 cm.  Left ovary:   A normal appearing left ovary is not visualized.  In the left adnexa there is a lobulated soft tissue mass identified which measures 5.3 x 6.3 x 6.4 cm.  This contains a small amount of flow peripherally but no central flow. This is felt unlikely to represent a subserosal or broad ligament fibroid as no fibroid was present on early obstetrical ultrasound performed in 2012.  A moderate amount of complex fluid is identified in the cul-de-sac, upper pelvis and right adnexal region.  Pulsed Doppler evaluation demonstrates normal low-resistance arterial and venous waveforms in the right ovary.  IMPRESSION:  Lobulated soft tissue mass with peripheral vascularity seen in the left adnexa with associated complex pelvic fluid.  The finding is suspicious in the appropriate clinical setting for ovarian torsion with associated hemoperitoneum.  Normal postpartum appearance to the uterus.  Normal right ovary  This report was personally called and discussed with the treating ER physician at 4:17 pm on 07/14/2011.  Original Report Authenticated By: Bertha Stakes, M.D.   [place] Art/ven Flow Abd Pelv Doppler  07/14/2011  *RADIOLOGY REPORT*  Clinical Data:  2 weeks post delivery with pelvic pain  TRANSABDOMINAL AND TRANSVAGINAL ULTRASOUND OF PELVIS DOPPLER ULTRASOUND OF OVARIES  Technique:  Both transabdominal and transvaginal ultrasound examinations of the pelvis were performed. Transabdominal technique was performed for global imaging of the pelvis including uterus, ovaries, adnexal regions, and pelvic cul-de-sac.  It was necessary to proceed with endovaginal exam following the transabdominal exam to visualize the adnexa.  Color and duplex Doppler ultrasound was utilized to evaluate blood flow to the ovaries.  Comparison:  11/10/2010  Findings:  Uterus:  Is enlarged with a sagittal length of 12.9 cm, depth of 7.0 cm and width of 8.4 cm.  Size is compatible with the patient's postpartum status.  No focal abnormalities are seen  Endometrium:  Is poorly defined and difficult to measure.  No abnormal flow is identified.  I suspect the appearance may be the result of some residual blood products within the endometrial canal this soon postpartum.  Right ovary: Has a normal appearance measuring 2.4 x 1.4 x 1.9 cm.  Left ovary:   A normal appearing left ovary is not visualized.  In the left adnexa there is a lobulated soft tissue mass identified which measures 5.3 x 6.3 x 6.4 cm.  This contains a small amount of flow peripherally but no central flow. This is felt unlikely to represent a subserosal or broad ligament fibroid as no fibroid  was present on early obstetrical ultrasound performed in 2012.  A moderate amount of complex fluid is identified in the cul-de-sac, upper pelvis and right adnexal region.  Pulsed Doppler evaluation demonstrates normal low-resistance arterial and venous waveforms in the right ovary.  IMPRESSION: Lobulated soft tissue mass with peripheral vascularity seen in the left adnexa with associated complex pelvic fluid.  The finding is suspicious in the appropriate clinical setting for ovarian torsion with associated hemoperitoneum.  Normal postpartum appearance to the uterus.  Normal right ovary  This report was personally called and discussed  with the treating ER physician at 4:17 pm on 07/14/2011.  Original Report Authenticated By: Bertha Stakes, M.D.    Assessment/Plan: 1. Septic pelvic thrombophlebitis vs tubo ovarian complex, infectious, with torsion  Really interesting clinical course over the last 3 weeks for patient.  Pain symptoms have just shown up, severe on day of admission.  Fever has been hectic for some time.  I am going to manage with treatment as if sptic pelvic thrombophlebitis for 72 hours or so and if does not improve, proceed with surgical management.  EURE,LUTHER H 07/15/2011, 11:16 AM

## 2011-07-15 NOTE — Progress Notes (Signed)
Patient ID: [name], female   DOB: August 22, 1985, 26 y.o.   MRN: [id number]  Length of Stay:  1  Days [name] is a 26 y.o.   Subjective: LLQ pain is moderate at times severe.  No vomiting, anorexia.  No trouble voiding.  No change from admission  Vitals:  Blood pressure 122/73, pulse 98, temperature 98.3 F (36.8 C), temperature source Oral, resp. rate 20, height 5\' 6"  (1.676 m), weight 278 lb 10.6 oz (126.4 kg), last menstrual period 06/30/2011, SpO2 94.00%, currently breastfeeding.   Physical Examination:  General appearance - in mild to moderate distress and ill-appearing Chest - clear to auscultation, no wheezes, rales or rhonchi, symmetric air entry Heart - normal rate, regular rhythm, normal S1, S2, no murmurs, rubs, clicks or gallops Abdomen - +BS soft voluntary guarding, -rebound, moderate to sever pain LLQ    Labs:  Recent Results (from the past 24 hour(s))  CBC   Collection Time   07/14/11  2:26 PM      Component Value Range   WBC 12.1 (*) 4.0 - 10.5 (K/uL)   RBC 4.09  3.87 - 5.11 (MIL/uL)   Hemoglobin 10.9 (*) 12.0 - 15.0 (g/dL)   HCT 40.9 (*) 81.1 - 46.0 (%)   MCV 78.0  78.0 - 100.0 (fL)   MCH 26.7  26.0 - 34.0 (pg)   MCHC 34.2  30.0 - 36.0 (g/dL)   RDW 91.4 (*) 78.2 - 15.5 (%)   Platelets 210  150 - 400 (K/uL)  DIFFERENTIAL   Collection Time   07/14/11  2:26 PM      Component Value Range   Neutrophils Relative 78 (*) 43 - 77 (%)   Lymphocytes Relative 15  12 - 46 (%)   Monocytes Relative 7  3 - 12 (%)   Eosinophils Relative 0  0 - 5 (%)   Basophils Relative 0  0 - 1 (%)   Neutro Abs 9.5 (*) 1.7 - 7.7 (K/uL)   Lymphs Abs 1.8  0.7 - 4.0 (K/uL)   Monocytes Absolute 0.8  0.1 - 1.0 (K/uL)   Eosinophils Absolute 0.0  0.0 - 0.7 (K/uL)   Basophils Absolute 0.0  0.0 - 0.1 (K/uL)   WBC Morphology ATYPICAL LYMPHOCYTES     Smear Review LARGE PLATELETS PRESENT    COMPREHENSIVE METABOLIC PANEL   Collection Time   07/14/11  2:26 PM      Component Value Range     Sodium 138  135 - 145 (mEq/L)   Potassium 3.3 (*) 3.5 - 5.1 (mEq/L)   Chloride 108  96 - 112 (mEq/L)   CO2 22  19 - 32 (mEq/L)   Glucose, Bld 127 (*) 70 - 99 (mg/dL)   BUN 11  6 - 23 (mg/dL)   Creatinine, Ser 9.56  0.50 - 1.10 (mg/dL)   Calcium 9.1  8.4 - 21.3 (mg/dL)   Total Protein 6.8  6.0 - 8.3 (g/dL)   Albumin 2.0 (*) 3.5 - 5.2 (g/dL)   AST 26  0 - 37 (U/L)   ALT 21  0 - 35 (U/L)   Alkaline Phosphatase 191 (*) 39 - 117 (U/L)   Total Bilirubin 0.9  0.3 - 1.2 (mg/dL)   GFR calc non Af Amer 76 (*) >90 (mL/min)   GFR calc Af Amer 88 (*) >90 (mL/min)  HCG, QUANTITATIVE, PREGNANCY   Collection Time   07/14/11  2:26 PM      Component Value Range   hCG, Beta  Chain, Quant, S <1  <5 (mIU/mL)  LACTIC ACID, PLASMA   Collection Time   07/14/11  2:28 PM      Component Value Range   Lactic Acid, Venous 1.4  0.5 - 2.2 (mmol/L)  URINALYSIS, ROUTINE W REFLEX MICROSCOPIC   Collection Time   07/14/11  2:42 PM      Component Value Range   Color, Urine YELLOW  YELLOW    APPearance CLEAR  CLEAR    Specific Gravity, Urine 1.020  1.005 - 1.030    pH 5.5  5.0 - 8.0    Glucose, UA NEGATIVE  NEGATIVE (mg/dL)   Hgb urine dipstick NEGATIVE  NEGATIVE    Bilirubin Urine NEGATIVE  NEGATIVE    Ketones, ur NEGATIVE  NEGATIVE (mg/dL)   Protein, ur NEGATIVE  NEGATIVE (mg/dL)   Urobilinogen, UA 0.2  0.0 - 1.0 (mg/dL)   Nitrite NEGATIVE  NEGATIVE    Leukocytes, UA NEGATIVE  NEGATIVE   WET PREP, GENITAL   Collection Time   07/14/11  2:56 PM      Component Value Range   Yeast Wet Prep HPF POC NONE SEEN  NONE SEEN    Trich, Wet Prep NONE SEEN  NONE SEEN    Clue Cells Wet Prep HPF POC NONE SEEN  NONE SEEN    WBC, Wet Prep HPF POC TOO NUMEROUS TO COUNT (*) NONE SEEN   GC/CHLAMYDIA PROBE AMP, GENITAL   Collection Time   07/14/11  2:56 PM      Component Value Range   GC Probe Amp, Genital NEGATIVE  NEGATIVE    Chlamydia, DNA Probe NEGATIVE  NEGATIVE   CBC   Collection Time   07/15/11  5:33 AM       Component Value Range   WBC 22.8 (*) 4.0 - 10.5 (K/uL)   RBC 3.45 (*) 3.87 - 5.11 (MIL/uL)   Hemoglobin 9.3 (*) 12.0 - 15.0 (g/dL)   HCT 16.1 (*) 09.6 - 46.0 (%)   MCV 78.3  78.0 - 100.0 (fL)   MCH 27.0  26.0 - 34.0 (pg)   MCHC 34.4  30.0 - 36.0 (g/dL)   RDW 04.5 (*) 40.9 - 15.5 (%)   Platelets 207  150 - 400 (K/uL)  DIFFERENTIAL   Collection Time   07/15/11  5:33 AM      Component Value Range   Neutrophils Relative 85 (*) 43 - 77 (%)   Neutro Abs 19.5 (*) 1.7 - 7.7 (K/uL)   Lymphocytes Relative 10 (*) 12 - 46 (%)   Lymphs Abs 2.3  0.7 - 4.0 (K/uL)   Monocytes Relative 4  3 - 12 (%)   Monocytes Absolute 1.0  0.1 - 1.0 (K/uL)   Eosinophils Relative 0  0 - 5 (%)   Eosinophils Absolute 0.0  0.0 - 0.7 (K/uL)   Basophils Relative 0  0 - 1 (%)   Basophils Absolute 0.0  0.0 - 0.1 (K/uL)   WBC Morphology INCREASED BANDS (>20% BANDS)      Imaging Studies:       Medications:  Scheduled    . acetaminophen  650 mg Oral Once  . cefTRIAXone (ROCEPHIN)  IV  1 g Intravenous Once  . docusate sodium  100 mg Oral BID  . enoxaparin (LOVENOX) injection  120 mg Subcutaneous Q12H  . fentaNYL  100 mcg Intravenous Once  .  HYDROmorphone (DILAUDID) injection  1 mg Intravenous Once  . HYDROmorphone PCA 0.3 mg/mL   Intravenous Q4H  . ondansetron (  ZOFRAN) IV  4 mg Intravenous Once  . piperacillin-tazobactam (ZOSYN)  IV  3.375 g Intravenous Q8H  . prenatal multivitamin  1 tablet Oral Daily  . sodium chloride  500 mL Intravenous Once  . DISCONTD: cefTRIAXone (ROCEPHIN) 1 GM IVPB  1 g Intravenous Once  . DISCONTD: piperacillin-tazobactam  3.375 g Intravenous Q8H   I have reviewed the patient's current medications.  ASSESSMENT: 1. Septic pelvic thrombophlebitis vs tubo ovarian complex with torsion   PLAN: Continue antibiotics, zosyn, and anti coagulation, lovenox.  If patient does not respond clinically in 72 hours or so, will proceed with lap LSO.  Arleth Mccullar H 07/15/2011,11:04  AM

## 2011-07-15 NOTE — Plan of Care (Signed)
Problem: Phase I Progression Outcomes Goal: OOB as tolerated unless otherwise ordered Outcome: Progressing Ambulates to the bathroom with supervision.

## 2011-07-15 NOTE — Progress Notes (Signed)
Notified Dr. Emelda Fear about patient having progressive temperature from beginning at 99.0 with the start of PCA to 102.9 now.  The patient does c/o chills and she is tachyphenic.  O2 sats remain greater than 94%.  I voiced to MD that I had already given her some Tylenol previously and the plan now was to give Ib profen as ordered and also there was a k pad order.  MD also gave me new orders.  Will continue to monitor.  MD states to call him if the patient worsens.

## 2011-07-15 NOTE — Plan of Care (Signed)
Problem: Phase I Progression Outcomes Goal: Pain controlled with appropriate interventions Outcome: Progressing Pt started on the Dilaudid PCA pump.

## 2011-07-16 LAB — URINE CULTURE
Colony Count: NO GROWTH
Culture  Setup Time: 201302010528

## 2011-07-16 MED ORDER — METRONIDAZOLE IN NACL 5-0.79 MG/ML-% IV SOLN
500.0000 mg | Freq: Three times a day (TID) | INTRAVENOUS | Status: DC
Start: 2011-07-16 — End: 2011-07-18
  Administered 2011-07-16 – 2011-07-18 (×6): 500 mg via INTRAVENOUS
  Filled 2011-07-16 (×10): qty 100

## 2011-07-16 MED ORDER — HYDROMORPHONE 0.3 MG/ML IV SOLN
INTRAVENOUS | Status: AC
  Filled 2011-07-16: qty 25

## 2011-07-16 NOTE — Progress Notes (Signed)
Patient ID: [name], female   DOB: [birth year], 26 y.o.   MRN: [id number]  Length of Stay:  2  Days [name] is a 26 y.o.   Subjective: LLQ pain is moderate at times severe.  No vomiting, anorexia.  No trouble voiding.  No change from admission Patient has really not shown any improvement either objectively or subjectively.  Vitals:  Blood pressure 142/77, pulse 128, temperature 99.2 F (37.3 C), temperature source Oral, resp. rate 20, height 5\' 6"  (1.676 m), weight 286 lb 6 oz (129.9 kg), last menstrual period 06/30/2011, SpO2 95.00%, currently breastfeeding.  Filed Vitals:   07/16/11 0520 07/16/11 0551 07/16/11 0655 07/16/11 0854  BP: 142/77     Pulse: 128     Temp: 103.5 F (39.7 C)  99.2 F (37.3 C)   TempSrc: Oral  Oral   Resp: 20   20  Height:      Weight:      SpO2: 88% 94%  95%    Physical Examination:  General appearance - in mild to moderate distress and ill-appearing Chest - clear to auscultation, no wheezes, rales or rhonchi, symmetric air entry Heart - normal rate, regular rhythm, normal S1, S2, no murmurs, rubs, clicks or gallops Abdomen - +BS soft voluntary guarding, -rebound, moderate to sever pain LLQ, no change in exam    Labs:  Recent Results (from the past 24 hour(s))  CBC   Collection Time   07/15/11  7:33 PM      Component Value Range   WBC 24.3 (*) 4.0 - 10.5 (K/uL)   RBC 3.62 (*) 3.87 - 5.11 (MIL/uL)   Hemoglobin 9.8 (*) 12.0 - 15.0 (g/dL)   HCT 62.1 (*) 30.8 - 46.0 (%)   MCV 78.7  78.0 - 100.0 (fL)   MCH 27.1  26.0 - 34.0 (pg)   MCHC 34.4  30.0 - 36.0 (g/dL)   RDW 65.7 (*) 84.6 - 15.5 (%)   Platelets 247  150 - 400 (K/uL)  DIFFERENTIAL   Collection Time   07/15/11  7:33 PM      Component Value Range   Neutrophils Relative 87 (*) 43 - 77 (%)   Neutro Abs 21.1 (*) 1.7 - 7.7 (K/uL)   Lymphocytes Relative 9 (*) 12 - 46 (%)   Lymphs Abs 2.2  0.7 - 4.0 (K/uL)   Monocytes Relative 4  3 - 12 (%)   Monocytes Absolute 1.0  0.1 - 1.0 (K/uL)    Eosinophils Relative 0  0 - 5 (%)   Eosinophils Absolute 0.0  0.0 - 0.7 (K/uL)   Basophils Relative 0  0 - 1 (%)   Basophils Absolute 0.0  0.0 - 0.1 (K/uL)    Imaging Studies:    No new   Medications:  Scheduled    . docusate sodium  100 mg Oral BID  . enoxaparin (LOVENOX) injection  120 mg Subcutaneous Q12H  . HYDROmorphone PCA 0.3 mg/mL   Intravenous Q4H  . HYDROmorphone PCA 0.3 mg/mL      . HYDROmorphone PCA 0.3 mg/mL      . metronidazole  500 mg Intravenous Q8H  . piperacillin-tazobactam (ZOSYN)  IV  3.375 g Intravenous Q8H  . prenatal multivitamin  1 tablet Oral Daily  . sodium chloride  500 mL Intravenous Once   I have reviewed the patient's current medications.  ASSESSMENT: 1. Septic pelvic thrombophlebitis vs tubo ovarian complex with possible torsion   PLAN: Continue antibiotics, zosyn, and anti coagulation, lovenox.  Sinno improvement will add flagyl to regimen although the fever curve is suggestive of bacteremia, not anaerobic abscess.  Patient has not shown any signs of improvement either objectively or subjectively.  i informed the patient that if she does not show any signs of improvement by 2/4 am then we will proceed with diagnostic/operative laparoscopy with intention of removal of left adnexa and indicated procedures.  She is aware that she may require laparotomy depending on the findings at surgery.  Patient understands and  agrees with plan.  Tyjon Bowen H 07/16/2011,11:12 AM

## 2011-07-16 NOTE — Consult Note (Signed)
ANTICOAGULATION CONSULT NOTE - Follow Up Consult  Pharmacy Consult for Lovenox Indication: possible septic pelvic thrombophlebitis  No Known Allergies  Patient Measurements: Height: 5\' 6"  (167.6 cm) Weight: 286 lb 6 oz (129.9 kg) IBW/kg (Calculated) : 59.3   Vital Signs: Temp: 99.2 F (37.3 C) (02/02 0655) Temp src: Oral (02/02 0655) BP: 142/77 mmHg (02/02 0520) Pulse Rate: 128  (02/02 0520)  Labs:  Basename 07/15/11 1933 07/15/11 0533 07/14/11 1426  HGB 9.8* 9.3* --  HCT 28.5* 27.0* 31.9*  PLT 247 207 210  APTT -- -- --  LABPROT -- -- --  INR -- -- --  HEPARINUNFRC -- -- --  CREATININE -- -- 1.01  CKTOTAL -- -- --  CKMB -- -- --  TROPONINI -- -- --   Estimated Creatinine Clearance: 116.6 ml/min (by C-G formula based on Cr of 1.01).  Medications:  Scheduled:    . docusate sodium  100 mg Oral BID  . enoxaparin (LOVENOX) injection  120 mg Subcutaneous Q12H  . HYDROmorphone PCA 0.3 mg/mL   Intravenous Q4H  . HYDROmorphone PCA 0.3 mg/mL      . HYDROmorphone PCA 0.3 mg/mL      . piperacillin-tazobactam (ZOSYN)  IV  3.375 g Intravenous Q8H  . prenatal multivitamin  1 tablet Oral Daily  . sodium chloride  500 mL Intravenous Once   Assessment: Good renal fxn Obesity Platelets OK  Goal of Therapy:  Full dose anticoagulation with Lovenox   Plan: Continue Lovenox 1mg /kg sq q12hrs CBC per protocol  Valrie Hart A 07/16/2011,8:20 AM

## 2011-07-16 NOTE — Progress Notes (Signed)
Patient alert and oriented this morning, no difficulty breathing, no confusion, HR elevated in the 120s at times, no weakness but does have complaints of abdominal pain, PCA encouraged, patient stated that [name] voided several times during the night and a hat was placed to measure future output, fever 103.5, tylenol 650 mg given will continue to monitor.

## 2011-07-17 ENCOUNTER — Inpatient Hospital Stay (HOSPITAL_COMMUNITY): Payer: Medicaid Other

## 2011-07-17 MED ORDER — KETOROLAC TROMETHAMINE 30 MG/ML IJ SOLN
30.0000 mg | Freq: Three times a day (TID) | INTRAMUSCULAR | Status: DC
  Administered 2011-07-17 – 2011-07-18 (×3): 30 mg via INTRAVENOUS
  Filled 2011-07-17 (×3): qty 1

## 2011-07-17 MED ORDER — PIPERACILLIN-TAZOBACTAM 3.375 G IVPB
INTRAVENOUS | Status: AC
  Filled 2011-07-17: qty 50

## 2011-07-17 MED ORDER — HYDROMORPHONE 0.3 MG/ML IV SOLN
INTRAVENOUS | Status: AC
  Administered 2011-07-17: 04:00:00
  Filled 2011-07-17: qty 25

## 2011-07-17 MED ORDER — HYDROMORPHONE 0.3 MG/ML IV SOLN
INTRAVENOUS | Status: AC
  Filled 2011-07-17: qty 25

## 2011-07-17 NOTE — Progress Notes (Signed)
Spoke with Dr. Despina Hidden about the patients pain and new orders were given.

## 2011-07-17 NOTE — Progress Notes (Signed)
Left a message on the OR scheduler Kim Faint for pts surgery.  House Supervisor Sela Hua has already been notified by MD.

## 2011-07-17 NOTE — Plan of Care (Signed)
Problem: Phase III Progression Outcomes Goal: IV/normal saline lock discontinued Outcome: Not Progressing Pt will need IV for now plan for surgery in am.

## 2011-07-17 NOTE — Progress Notes (Signed)
Notified Dr. Jearld Lesch due to patient c/o new pain radiating from her left pelvic area to her back.  She was SOB and rated pain 10.  MD stated she would notify Dr. Despina Hidden and call me back. Will continue to monitor patient.

## 2011-07-18 ENCOUNTER — Encounter (HOSPITAL_COMMUNITY): Payer: Self-pay | Admitting: Anesthesiology

## 2011-07-18 ENCOUNTER — Inpatient Hospital Stay (HOSPITAL_COMMUNITY): Payer: Medicaid Other | Admitting: Anesthesiology

## 2011-07-18 ENCOUNTER — Encounter (HOSPITAL_COMMUNITY)
Admission: EM | Disposition: A | Discharge status: Home / Self Care | Payer: Self-pay | Source: Home / Self Care | Attending: Obstetrics & Gynecology

## 2011-07-18 ENCOUNTER — Encounter (HOSPITAL_COMMUNITY): Payer: Self-pay | Admitting: *Deleted

## 2011-07-18 ENCOUNTER — Inpatient Hospital Stay (HOSPITAL_COMMUNITY): Payer: Medicaid Other

## 2011-07-18 ENCOUNTER — Other Ambulatory Visit: Payer: Self-pay | Admitting: Obstetrics & Gynecology

## 2011-07-18 LAB — CBC
HCT: 26 % — ABNORMAL LOW (ref 36.0–46.0)
Hemoglobin: 8.7 g/dL — ABNORMAL LOW (ref 12.0–15.0)
MCHC: 35 g/dL (ref 30.0–36.0)
MCHC: 34.1 g/dL (ref 30.0–36.0)
MCV: 78.1 fL (ref 78.0–100.0)
RDW: 16.2 % — ABNORMAL HIGH (ref 11.5–15.5)
WBC: 21.5 10*3/uL — ABNORMAL HIGH (ref 4.0–10.5)
WBC: 20.4 10*3/uL — ABNORMAL HIGH (ref 4.0–10.5)

## 2011-07-18 LAB — BLOOD GAS, ARTERIAL
Bicarbonate: 21.8 mEq/L (ref 20.0–24.0)
Patient temperature: 37
pCO2 arterial: 35.7 mmHg (ref 35.0–45.0)
pH, Arterial: 7.402 — ABNORMAL HIGH (ref 7.350–7.400)
pO2, Arterial: 80.6 mmHg (ref 80.0–100.0)

## 2011-07-18 LAB — BASIC METABOLIC PANEL
BUN: 4 mg/dL — ABNORMAL LOW (ref 6–23)
CO2: 20 mEq/L (ref 19–32)
Calcium: 8.7 mg/dL (ref 8.4–10.5)
Chloride: 107 mEq/L (ref 96–112)
Creatinine, Ser: 0.83 mg/dL (ref 0.50–1.10)
Creatinine, Ser: 0.9 mg/dL (ref 0.50–1.10)
GFR calc Af Amer: >90 mL/min (ref >90)
GFR calc non Af Amer: 87 mL/min — ABNORMAL LOW (ref >90)
Sodium: 138 mEq/L (ref 135–145)

## 2011-07-18 LAB — DIFFERENTIAL
Basophils Absolute: 0.1 10*3/uL (ref 0.0–0.1)
Basophils Absolute: 0 10*3/uL (ref 0.0–0.1)
Basophils Relative: 0 % (ref 0–1)
Eosinophils Absolute: 0 10*3/uL (ref 0.0–0.7)
Eosinophils Relative: 1 % (ref 0–5)
Lymphocytes Relative: 11 % — ABNORMAL LOW (ref 12–46)
Lymphocytes Relative: 5 % — ABNORMAL LOW (ref 12–46)
Monocytes Absolute: 1 10*3/uL (ref 0.1–1.0)
Monocytes Relative: 5 % (ref 3–12)
Neutrophils Relative %: 92 % — ABNORMAL HIGH (ref 43–77)

## 2011-07-18 LAB — POCT I-STAT 4, (NA,K, GLUC, HGB,HCT)
Glucose, Bld: 102 mg/dL — ABNORMAL HIGH (ref 70–99)
HCT: 27 % — ABNORMAL LOW (ref 36.0–46.0)
Hemoglobin: 9.2 g/dL — ABNORMAL LOW (ref 12.0–15.0)
Potassium: 4.2 mEq/L (ref 3.5–5.1)

## 2011-07-18 LAB — HEPATIC FUNCTION PANEL
ALT: 12 U/L (ref 0–35)
AST: 16 U/L (ref 0–37)
Alkaline Phosphatase: 155 U/L — ABNORMAL HIGH (ref 39–117)
Bilirubin, Direct: 0.3 mg/dL (ref 0.0–0.3)
Total Bilirubin: 0.6 mg/dL (ref 0.3–1.2)

## 2011-07-18 LAB — PROCALCITONIN: Procalcitonin: 4.82 ng/mL

## 2011-07-18 SURGERY — SALPINGO-OOPHORECTOMY, LAPAROSCOPIC
Anesthesia: General | Laterality: Left | Wound class: Dirty or Infected

## 2011-07-18 MED ORDER — MUPIROCIN CALCIUM 2 % EX CREA
TOPICAL_CREAM | Freq: Two times a day (BID) | CUTANEOUS | Status: DC
  Filled 2011-07-18: qty 15

## 2011-07-18 MED ORDER — ONDANSETRON HCL 4 MG/2ML IJ SOLN
INTRAMUSCULAR | Status: AC
  Filled 2011-07-18: qty 2

## 2011-07-18 MED ORDER — SODIUM CHLORIDE 0.9 % IJ SOLN
INTRAMUSCULAR | Status: AC
  Filled 2011-07-18: qty 3

## 2011-07-18 MED ORDER — SODIUM CHLORIDE 0.9 % IV SOLN
2.0000 g | Freq: Four times a day (QID) | INTRAVENOUS | Status: DC
  Administered 2011-07-18 – 2011-07-26 (×30): 2 g via INTRAVENOUS
  Filled 2011-07-18 (×34): qty 2000

## 2011-07-18 MED ORDER — CLINDAMYCIN PHOSPHATE 900 MG/50ML IV SOLN
900.0000 mg | Freq: Three times a day (TID) | INTRAVENOUS | Status: DC
  Administered 2011-07-18 – 2011-07-26 (×22): 900 mg via INTRAVENOUS
  Filled 2011-07-18 (×24): qty 50

## 2011-07-18 MED ORDER — HYDROMORPHONE 0.3 MG/ML IV SOLN
INTRAVENOUS | Status: AC
  Filled 2011-07-18: qty 25

## 2011-07-18 MED ORDER — FUROSEMIDE 10 MG/ML IJ SOLN
INTRAMUSCULAR | Status: AC
  Filled 2011-07-18: qty 10

## 2011-07-18 MED ORDER — SUCCINYLCHOLINE CHLORIDE 20 MG/ML IJ SOLN
INTRAMUSCULAR | Status: AC
  Filled 2011-07-18: qty 1

## 2011-07-18 MED ORDER — SUCCINYLCHOLINE CHLORIDE 20 MG/ML IJ SOLN
INTRAMUSCULAR | Status: DC | PRN
  Administered 2011-07-18: 120 mg via INTRAVENOUS

## 2011-07-18 MED ORDER — MIDAZOLAM HCL 2 MG/2ML IJ SOLN
1.0000 mg | INTRAMUSCULAR | Status: DC | PRN
  Administered 2011-07-18: 2 mg via INTRAVENOUS

## 2011-07-18 MED ORDER — MIDAZOLAM HCL 2 MG/2ML IJ SOLN
INTRAMUSCULAR | Status: AC
  Administered 2011-07-18: 2 mg via INTRAVENOUS
  Filled 2011-07-18: qty 2

## 2011-07-18 MED ORDER — GENTAMICIN SULFATE 40 MG/ML IJ SOLN
6.0000 mg/kg | INTRAVENOUS | Status: DC
  Filled 2011-07-18: qty 13.2

## 2011-07-18 MED ORDER — METRONIDAZOLE IN NACL 5-0.79 MG/ML-% IV SOLN
INTRAVENOUS | Status: AC
  Filled 2011-07-18: qty 100

## 2011-07-18 MED ORDER — HEMOSTATIC AGENTS (NO CHARGE) OPTIME
TOPICAL | Status: DC | PRN
  Administered 2011-07-18: 1 via TOPICAL

## 2011-07-18 MED ORDER — FENTANYL CITRATE 0.05 MG/ML IJ SOLN
INTRAMUSCULAR | Status: DC | PRN
  Administered 2011-07-18 (×2): 50 ug via INTRAVENOUS
  Administered 2011-07-18: 25 ug via INTRAVENOUS
  Administered 2011-07-18: 50 ug via INTRAVENOUS
  Administered 2011-07-18: 25 ug via INTRAVENOUS
  Administered 2011-07-18 (×3): 50 ug via INTRAVENOUS

## 2011-07-18 MED ORDER — NEOSTIGMINE METHYLSULFATE 1 MG/ML IJ SOLN
INTRAMUSCULAR | Status: DC | PRN
  Administered 2011-07-18: 4 mg via INTRAVENOUS
  Administered 2011-07-18: 1 mg via INTRAVENOUS

## 2011-07-18 MED ORDER — FENTANYL CITRATE 0.05 MG/ML IJ SOLN
INTRAMUSCULAR | Status: AC
  Administered 2011-07-18: 25 ug via INTRAVENOUS
  Filled 2011-07-18: qty 5

## 2011-07-18 MED ORDER — FUROSEMIDE 10 MG/ML IJ SOLN
INTRAMUSCULAR | Status: DC | PRN
  Administered 2011-07-18: 40 mg via INTRAVENOUS

## 2011-07-18 MED ORDER — ONDANSETRON HCL 4 MG/2ML IJ SOLN: 4.0000 mg | Freq: Once | INTRAMUSCULAR | Status: DC | PRN

## 2011-07-18 MED ORDER — PROPOFOL 10 MG/ML IV EMUL
INTRAVENOUS | Status: AC
  Filled 2011-07-18: qty 20

## 2011-07-18 MED ORDER — BUPIVACAINE-EPINEPHRINE PF 0.5-1:200000 % IJ SOLN
INTRAMUSCULAR | Status: AC
  Filled 2011-07-18: qty 10

## 2011-07-18 MED ORDER — FENTANYL CITRATE 0.05 MG/ML IJ SOLN
INTRAMUSCULAR | Status: AC
  Administered 2011-07-18: 25 ug via INTRAVENOUS
  Filled 2011-07-18: qty 2

## 2011-07-18 MED ORDER — GLYCOPYRROLATE 0.2 MG/ML IJ SOLN
INTRAMUSCULAR | Status: AC
  Filled 2011-07-18: qty 1

## 2011-07-18 MED ORDER — LIDOCAINE HCL 1 % IJ SOLN
INTRAMUSCULAR | Status: DC | PRN
  Administered 2011-07-18: 10 mg via INTRADERMAL

## 2011-07-18 MED ORDER — SODIUM CHLORIDE 0.9 % IR SOLN
Status: DC | PRN
  Administered 2011-07-18: 1000 mL

## 2011-07-18 MED ORDER — NEOSTIGMINE METHYLSULFATE 1 MG/ML IJ SOLN
INTRAMUSCULAR | Status: AC
  Filled 2011-07-18: qty 10

## 2011-07-18 MED ORDER — LACTATED RINGERS IV SOLN
INTRAVENOUS | Status: DC
  Administered 2011-07-18 (×2): via INTRAVENOUS

## 2011-07-18 MED ORDER — LIDOCAINE HCL (PF) 1 % IJ SOLN
INTRAMUSCULAR | Status: AC
  Filled 2011-07-18: qty 5

## 2011-07-18 MED ORDER — ROCURONIUM BROMIDE 50 MG/5ML IV SOLN
INTRAVENOUS | Status: AC
  Filled 2011-07-18: qty 1

## 2011-07-18 MED ORDER — FENTANYL CITRATE 0.05 MG/ML IJ SOLN
25.0000 ug | INTRAMUSCULAR | Status: DC | PRN
  Administered 2011-07-18: 50 ug via INTRAVENOUS
  Administered 2011-07-18 (×2): 25 ug via INTRAVENOUS

## 2011-07-18 MED ORDER — ROCURONIUM BROMIDE 100 MG/10ML IV SOLN
INTRAVENOUS | Status: DC | PRN
  Administered 2011-07-18: 25 mg via INTRAVENOUS
  Administered 2011-07-18 (×3): 10 mg via INTRAVENOUS
  Administered 2011-07-18: 5 mg via INTRAVENOUS
  Administered 2011-07-18 (×3): 10 mg via INTRAVENOUS

## 2011-07-18 MED ORDER — MUPIROCIN 2 % EX OINT
TOPICAL_OINTMENT | CUTANEOUS | Status: AC
  Administered 2011-07-18: 1
  Filled 2011-07-18: qty 22

## 2011-07-18 MED ORDER — ONDANSETRON HCL 4 MG/2ML IJ SOLN: 4.0000 mg | Freq: Once | INTRAMUSCULAR | Status: DC

## 2011-07-18 MED ORDER — GLYCOPYRROLATE 0.2 MG/ML IJ SOLN
INTRAMUSCULAR | Status: DC | PRN
  Administered 2011-07-18: .2 mg via INTRAVENOUS
  Administered 2011-07-18: .6 mg via INTRAVENOUS

## 2011-07-18 MED ORDER — PROPOFOL 10 MG/ML IV BOLUS
INTRAVENOUS | Status: DC | PRN
  Administered 2011-07-18: 30 mg via INTRAVENOUS
  Administered 2011-07-18: 150 mg via INTRAVENOUS

## 2011-07-18 MED ORDER — KCL IN DEXTROSE-NACL 20-5-0.45 MEQ/L-%-% IV SOLN
INTRAVENOUS | Status: DC
  Administered 2011-07-18 – 2011-07-22 (×7): via INTRAVENOUS

## 2011-07-18 MED ORDER — FENTANYL CITRATE 0.05 MG/ML IJ SOLN
INTRAMUSCULAR | Status: AC
  Administered 2011-07-18: 50 ug via INTRAVENOUS
  Filled 2011-07-18: qty 2

## 2011-07-18 MED ORDER — GLYCOPYRROLATE 0.2 MG/ML IJ SOLN
INTRAMUSCULAR | Status: AC
  Filled 2011-07-18: qty 2

## 2011-07-18 SURGICAL SUPPLY — 55 items
ADH SKN CLS APL DERMABOND .7 (GAUZE/BANDAGES/DRESSINGS)
APPLIER CLIP UNV 5X34 EPIX (ENDOMECHANICALS) ×2 IMPLANT
BAG HAMPER (MISCELLANEOUS) ×2 IMPLANT
BAG SPEC RTRVL LRG 6X4 10 (ENDOMECHANICALS) ×1
CELLS DAT CNTRL 66122 CELL SVR (MISCELLANEOUS) IMPLANT
CLOTH BEACON ORANGE TIMEOUT ST (SAFETY) ×2 IMPLANT
COVER LIGHT HANDLE STERIS (MISCELLANEOUS) ×4 IMPLANT
DECANTER SPIKE VIAL GLASS SM (MISCELLANEOUS) ×2 IMPLANT
DERMABOND ADVANCED (GAUZE/BANDAGES/DRESSINGS)
DERMABOND ADVANCED .7 DNX12 (GAUZE/BANDAGES/DRESSINGS) IMPLANT
DRAPE WARM FLUID 44X44 (DRAPE) IMPLANT
DRESSING TELFA 8X3 (GAUZE/BANDAGES/DRESSINGS) IMPLANT
DRSG TEGADERM 4X4.75 (GAUZE/BANDAGES/DRESSINGS) IMPLANT
ELECT REM PT RETURN 9FT ADLT (ELECTROSURGICAL) ×2
ELECTRODE REM PT RTRN 9FT ADLT (ELECTROSURGICAL) ×1 IMPLANT
FORMALIN 10 PREFIL 480ML (MISCELLANEOUS) ×2 IMPLANT
GLOVE BIOGEL PI IND STRL 8 (GLOVE) ×1 IMPLANT
GLOVE BIOGEL PI INDICATOR 8 (GLOVE) ×1
GLOVE ECLIPSE 6.5 STRL STRAW (GLOVE) ×4 IMPLANT
GLOVE ECLIPSE 7.0 STRL STRAW (GLOVE) ×2 IMPLANT
GLOVE ECLIPSE 8.0 STRL XLNG CF (GLOVE) ×2 IMPLANT
GLOVE EXAM NITRILE MD LF STRL (GLOVE) ×4 IMPLANT
GLOVE INDICATOR 7.0 STRL GRN (GLOVE) ×6 IMPLANT
GOWN STRL REIN XL XLG (GOWN DISPOSABLE) ×6 IMPLANT
HEMOSTAT SURGICEL 4X8 (HEMOSTASIS) ×2 IMPLANT
INST SET MAJOR GENERAL (KITS) ×2 IMPLANT
IV NS IRRIG 3000ML ARTHROMATIC (IV SOLUTION) ×4 IMPLANT
KIT ROOM TURNOVER APOR (KITS) ×2 IMPLANT
MANIFOLD NEPTUNE II (INSTRUMENTS) ×2 IMPLANT
NEEDLE HYPO 22GX1.5 SAFETY (NEEDLE) IMPLANT
NS IRRIG 1000ML POUR BTL (IV SOLUTION) ×2 IMPLANT
PACK ABDOMINAL MAJOR (CUSTOM PROCEDURE TRAY) ×2 IMPLANT
PAD ARMBOARD 7.5X6 YLW CONV (MISCELLANEOUS) ×2 IMPLANT
POUCH SPECIMEN RETRIEVAL 10MM (ENDOMECHANICALS) ×2 IMPLANT
RETRACTOR WND ALEXIS 25 LRG (MISCELLANEOUS) IMPLANT
RTRCTR WOUND ALEXIS 18CM MED (MISCELLANEOUS)
RTRCTR WOUND ALEXIS 25CM LRG (MISCELLANEOUS)
SCALPEL HARMONIC ACE (MISCELLANEOUS) ×2 IMPLANT
SET BASIN LINEN APH (SET/KITS/TRAYS/PACK) ×2 IMPLANT
SET IRRIG TUBING LAPAROSCOPIC (IRRIGATION / IRRIGATOR) ×4 IMPLANT
SPONGE GAUZE 4X4 12PLY (GAUZE/BANDAGES/DRESSINGS) IMPLANT
SPONGE INTESTINAL PEANUT (DISPOSABLE) IMPLANT
STAPLER VISISTAT 35W (STAPLE) ×2 IMPLANT
SUCTION POOLE TIP (SUCTIONS) IMPLANT
SUT CHROMIC 0 CT 1 (SUTURE) IMPLANT
SUT VIC AB 0 CT1 27 (SUTURE)
SUT VIC AB 0 CT1 27XBRD ANTBC (SUTURE) IMPLANT
SUT VIC AB 0 CT1 27XCR 8 STRN (SUTURE) IMPLANT
SUT VIC AB 0 CTX 36 (SUTURE)
SUT VIC AB 0 CTX36XBRD ANTBCTR (SUTURE) IMPLANT
SUT VICRYL 0 UR6 27IN ABS (SUTURE) ×4 IMPLANT
SUT VICRYL 3 0 (SUTURE) IMPLANT
TOWEL OR 17X26 4PK STRL BLUE (TOWEL DISPOSABLE) IMPLANT
TRAY FOLEY CATH 14FR (SET/KITS/TRAYS/PACK) ×2 IMPLANT
TROCAR Z-THREAD SLEEVE 11X100 (TROCAR) ×2 IMPLANT

## 2011-07-18 NOTE — Progress Notes (Signed)
UR Chart Review Completed  

## 2011-07-18 NOTE — Progress Notes (Signed)
ANTIBIOTIC CONSULT NOTE - INITIAL  Pharmacy Consult for Gentamicin Indication: Pelvic abscess  No Known Allergies  Patient Measurements: Height: 5\' 6"  (167.6 cm) Weight: 288 lb 12.8 oz (131 kg) IBW/kg (Calculated) : 59.3  Adjusted Body Weight:100 kg Vital Signs: Temp: 97.9 F (36.6 C) (02/04 1600) Temp src: Oral (02/04 1051) BP: 124/77 mmHg (02/04 1600) Pulse Rate: 101  (02/04 1600) Intake/Output from previous day: 02/03 0701 - 02/04 0700 In: 5073.8 [P.O.:240; I.V.:4383.8; IV Piggyback:450] Out: 3200 [Urine:3200] Intake/Output from this shift: Total I/O In: 2500 [I.V.:2500] Out: 1750 [Urine:1500; Blood:250]  Labs:  [facility] 07/18/11 1418 07/18/11 0505 07/15/11 1933  WBC -- 21.5* 24.3*  HGB 9.2* 9.1* 9.8*  PLT -- 366 247  LABCREA -- -- --  CREATININE -- 0.83 --   Estimated Creatinine Clearance: 142.7 ml/min (by C-G formula based on Cr of 0.83).    Microbiology: Recent Results (from the past 720 hour(s))  URINE CULTURE     Status: Normal   Collection Time   07/02/11  9:20 PM      Component Value Range Status Comment   Specimen Description URINE, CATHETERIZED   Final    Special Requests NONE   Final    Culture  Setup Time [id number]   Final    Colony Count NO GROWTH   Final    Culture NO GROWTH   Final    Report Status 07/05/2011 FINAL   Final   URINE CULTURE     Status: Normal   Collection Time   07/14/11  2:42 PM      Component Value Range Status Comment   Specimen Description URINE, CATHETERIZED   Final    Special Requests NONE   Final    Culture  Setup Time [id number]   Final    Colony Count NO GROWTH   Final    Culture NO GROWTH   Final    Report Status 07/16/2011 FINAL   Final   WET PREP, GENITAL     Status: Abnormal   Collection Time   07/14/11  2:56 PM      Component Value Range Status Comment   Yeast Wet Prep HPF POC NONE SEEN  NONE SEEN  Final    Trich, Wet Prep NONE SEEN  NONE SEEN  Final    Clue Cells Wet Prep HPF POC NONE SEEN  NONE SEEN   Final    WBC, Wet Prep HPF POC TOO NUMEROUS TO COUNT (*) NONE SEEN  Final   CULTURE, BLOOD (ROUTINE X 2)     Status: Normal (Preliminary result)   Collection Time   07/15/11  7:30 PM      Component Value Range Status Comment   Specimen Description BLOOD LEFT ANTECUBITAL   Final    Special Requests BOTTLES DRAWN AEROBIC AND ANAEROBIC 6CC   Final    Culture NO GROWTH 3 DAYS   Final    Report Status PENDING   Incomplete   CULTURE, BLOOD (ROUTINE X 2)     Status: Normal (Preliminary result)   Collection Time   07/15/11  7:40 PM      Component Value Range Status Comment   Specimen Description BLOOD LEFT HAND   Final    Special Requests BOTTLES DRAWN AEROBIC AND ANAEROBIC 6CC   Final    Culture NO GROWTH 3 DAYS   Final    Report Status PENDING   Incomplete   SURGICAL PCR SCREEN     Status: Normal   Collection Time  07/18/11 11:19 AM      Component Value Range Status Comment   MRSA, PCR NEGATIVE  NEGATIVE  Final    Staphylococcus aureus NEGATIVE  NEGATIVE  Final     Medical History: Past Medical History  Diagnosis Date  . No pertinent past medical history   . Postpartum pain 06/30/2011    Vaginal delivery, moderate bleeding, tender in abd, esp LLQ    Medications:  Scheduled:    . ampicillin (OMNIPEN) IV  2 g Intravenous Q6H  . clindamycin (CLEOCIN) IV  900 mg Intravenous Q8H  . docusate sodium  100 mg Oral BID  . furosemide      . gentamicin  6 mg/kg (Adjusted) Intravenous Q24H  . glycopyrrolate      . glycopyrrolate      . HYDROmorphone PCA 0.3 mg/mL   Intravenous Q4H  . HYDROmorphone PCA 0.3 mg/mL      . HYDROmorphone PCA 0.3 mg/mL      . lidocaine      . metroNIDAZOLE      . mupirocin ointment      . neostigmine      . ondansetron      . prenatal multivitamin  1 tablet Oral Daily  . propofol      . rocuronium      . rocuronium      . sodium chloride      . succinylcholine      . DISCONTD: ketorolac  30 mg Intravenous Q8H  . DISCONTD: metronidazole  500 mg Intravenous  Q8H  . DISCONTD: mupirocin cream   Topical BID  . DISCONTD: ondansetron (ZOFRAN) IV  4 mg Intravenous Once  . DISCONTD: piperacillin-tazobactam (ZOSYN)  IV  3.375 g Intravenous Q8H  . DISCONTD: sodium chloride  500 mL Intravenous Once   Assessment: Empiric therapy. Ok for Once Daily protocol  Goal of Therapy:  Post dose 8 hours level be plotted on graph to assess adequate clearance.  Plan:  Gentamicin 528 mg IV every 24 hours. Obtain random level in 8 hours.  Caryl Asp 07/18/2011,4:29 PM

## 2011-07-18 NOTE — Anesthesia Procedure Notes (Addendum)
Procedure Name: Intubation Date/Time: 07/18/2011 12:18 PM Performed by: Glynn Octave Pre-anesthesia Checklist: Patient identified, Patient being monitored, Timeout performed, Emergency Drugs available and Suction available Patient Re-evaluated:Patient Re-evaluated prior to inductionOxygen Delivery Method: Circle System Utilized Preoxygenation: Pre-oxygenation with 100% oxygen Intubation Type: IV induction, Rapid sequence and Cricoid Pressure applied Laryngoscope Size: Mac and 3 Grade View: Grade II Tube type: Oral Tube size: 7.0 mm Number of attempts: 1 Airway Equipment and Method: stylet Placement Confirmation: ETT inserted through vocal cords under direct vision,  positive ETCO2 and breath sounds checked- equal and bilateral Secured at: 21 cm Tube secured with: Tape Dental Injury: Teeth and Oropharynx as per pre-operative assessment

## 2011-07-18 NOTE — Transfer of Care (Signed)
Immediate Anesthesia Transfer of Care Note  Patient: [name]  Procedure(s) Performed:  LAPAROSCOPIC SALPINGO OOPHERECTOMY  Patient Location: PACU  Anesthesia Type: General  Level of Consciousness: awake, alert , oriented and patient cooperative  Airway & Oxygen Therapy: Patient Spontanous Breathing and Patient connected to face mask oxygen  Post-op Assessment: Report given to PACU RN and Post -op Vital signs reviewed and stable  Post vital signs: Reviewed and stable  Complications: No apparent anesthesia complications

## 2011-07-18 NOTE — Progress Notes (Signed)
eLink Physician-Brief Progress Note Patient Name: [name] DOB: 1985/12/19 MRN: [id number]  Date of Service  07/18/2011   HPI/Events of Note  eRN aler that patient in resp distress Case revieewed  Camera exam Very shallow breathing, no abdominal movement, chest raise + RR 35 Alae Nasii flaring +   eICU Interventions  Check cxr, abg, hepatic panel, pct, lactate   Intervention Category Major Interventions: Respiratory failure - evaluation and management  Ebbie Cherry 07/18/2011, 6:36 PM

## 2011-07-18 NOTE — Progress Notes (Signed)
Patient ID: [name], female   DOB: [birth year], 26 y.o.   MRN: [id number]  Patient status is unchanged, spiking fevers with unrelenting LLQ pain.  Will proceed with laparoscopic LSO and indicated procedures.  Pt understands the risks of surgery including but not limited t  excessive bleeding requiring transfusion or reoperation, post-operative infection requiring prolonged hospitalization or re-hospitalization and antibiotic therapy, and damage to other organs including bladder, bowel, ureters and major vessels.  The patient also understands the alternative treatment options which were discussed in full.  All questions were answered.  [name] H 07/18/2011 12:01 PM

## 2011-07-18 NOTE — Anesthesia Postprocedure Evaluation (Signed)
  Anesthesia Post-op Note  Patient: [name]  Procedure(s) Performed:  LAPAROSCOPIC SALPINGO OOPHERECTOMY  Patient Location: PACU  Anesthesia Type: General  Level of Consciousness: awake, alert , oriented and patient cooperative  Airway and Oxygen Therapy: Patient connected to face mask oxygen  Post-op Pain: moderate  Post-op Assessment: Post-op Vital signs reviewed, Patient's Cardiovascular Status Stable, Respiratory Function Stable and Patent Airway  Post-op Vital Signs: Reviewed and stable  Complications: No apparent anesthesia complications

## 2011-07-18 NOTE — Anesthesia Preprocedure Evaluation (Addendum)
Anesthesia Evaluation  Patient identified by MRN, date of birth, ID band Patient awake    Reviewed: Allergy & Precautions, H&P , NPO status , Patient's Chart, lab work & pertinent test results  Airway Mallampati: III      Dental  (+) Teeth Intact   Pulmonary former smoker   Pulmonary exam normal       Cardiovascular neg cardio ROS Regular Tachycardia Anemia secondary to being post partum 06/2011.   Neuro/Psych    GI/Hepatic N/V today   Endo/Other  Morbid obesity  Renal/GU      Musculoskeletal   Abdominal   Peds  Hematology   Anesthesia Other Findings   Reproductive/Obstetrics                          Anesthesia Physical Anesthesia Plan  ASA: II  Anesthesia Plan: General   Post-op Pain Management:    Induction: Intravenous, Rapid sequence and Cricoid pressure planned  Airway Management Planned:   Additional Equipment:   Intra-op Plan:   Post-operative Plan: Extubation in OR  Informed Consent: I have reviewed the patients History and Physical, chart, labs and discussed the procedure including the risks, benefits and alternatives for the proposed anesthesia with the patient or authorized representative who has indicated his/her understanding and acceptance.     Plan Discussed with:   Anesthesia Plan Comments:         Anesthesia Quick Evaluation

## 2011-07-19 LAB — GENTAMICIN LEVEL, RANDOM: Gentamicin Rm: <0.2 ug/mL

## 2011-07-19 LAB — CBC
Platelets: 378 10*3/uL (ref 150–400)
RBC: 3 MIL/uL — ABNORMAL LOW (ref 3.87–5.11)
RDW: 16.1 % — ABNORMAL HIGH (ref 11.5–15.5)
WBC: 21.1 10*3/uL — ABNORMAL HIGH (ref 4.0–10.5)

## 2011-07-19 MED ORDER — HYDROMORPHONE 0.3 MG/ML IV SOLN
INTRAVENOUS | Status: AC
  Administered 2011-07-19: 15:00:00
  Filled 2011-07-19: qty 25

## 2011-07-19 MED ORDER — HYDROMORPHONE 0.3 MG/ML IV SOLN
INTRAVENOUS | Status: AC
  Administered 2011-07-19: 23:00:00
  Filled 2011-07-19: qty 25

## 2011-07-19 MED ORDER — HYDROMORPHONE 0.3 MG/ML IV SOLN
INTRAVENOUS | Status: AC
  Filled 2011-07-19: qty 25

## 2011-07-19 MED ORDER — FUROSEMIDE 10 MG/ML IJ SOLN
20.0000 mg | Freq: Once | INTRAMUSCULAR | Status: AC
  Administered 2011-07-19: 20 mg via INTRAVENOUS

## 2011-07-19 MED ORDER — DEXTROSE 5 % IV SOLN
6.0000 mg/kg | INTRAVENOUS | Status: DC
  Administered 2011-07-19 – 2011-07-26 (×8): 528 mg via INTRAVENOUS
  Filled 2011-07-19 (×9): qty 13.2

## 2011-07-19 NOTE — Progress Notes (Signed)
ANTIBIOTIC CONSULT NOTE  Pharmacy Consult for Gentamicin Indication: Pelvic abscess  No Known Allergies  Patient Measurements: Height: 5\' 6"  (167.6 cm) Weight: 307 lb 8.7 oz (139.5 kg) IBW/kg (Calculated) : 59.3  Adjusted Body Weight:100 kg Vital Signs: Temp: 100.3 F (37.9 C) (02/05 0700) Temp src: Oral (02/05 0700) BP: 126/73 mmHg (02/05 0900) Pulse Rate: 103  (02/05 0900) Intake/Output from previous day: 02/04 0701 - 02/05 0700 In: 4807.2 [P.O.:720; I.V.:3881.2; IV Piggyback:206] Out: 3200 [Urine:2950; Blood:250] Intake/Output from this shift:    Labs:  Basename 07/19/11 0535 07/18/11 1928 07/18/11 1704 07/18/11 1418 07/18/11 0505  WBC 21.1* 20.4* -- -- 21.5*  HGB 8.1* 8.7* -- 9.2* --  PLT 378 355 -- -- 366  LABCREA -- -- -- -- --  CREATININE -- -- 0.90 -- 0.83   Estimated Creatinine Clearance: 136.7 ml/min (by C-G formula based on Cr of 0.9).    Microbiology: Recent Results (from the past 720 hour(s))  URINE CULTURE     Status: Normal   Collection Time   07/02/11  9:20 PM      Component Value Range Status Comment   Specimen Description URINE, CATHETERIZED   Final    Special Requests NONE   Final    Culture  Setup Time [id number]   Final    Colony Count NO GROWTH   Final    Culture NO GROWTH   Final    Report Status 07/05/2011 FINAL   Final   URINE CULTURE     Status: Normal   Collection Time   07/14/11  2:42 PM      Component Value Range Status Comment   Specimen Description URINE, CATHETERIZED   Final    Special Requests NONE   Final    Culture  Setup Time [id number]   Final    Colony Count NO GROWTH   Final    Culture NO GROWTH   Final    Report Status 07/16/2011 FINAL   Final   WET PREP, GENITAL     Status: Abnormal   Collection Time   07/14/11  2:56 PM      Component Value Range Status Comment   Yeast Wet Prep HPF POC NONE SEEN  NONE SEEN  Final    Trich, Wet Prep NONE SEEN  NONE SEEN  Final    Clue Cells Wet Prep HPF POC NONE SEEN  NONE SEEN   Final    WBC, Wet Prep HPF POC TOO NUMEROUS TO COUNT (*) NONE SEEN  Final   CULTURE, BLOOD (ROUTINE X 2)     Status: Normal (Preliminary result)   Collection Time   07/15/11  7:30 PM      Component Value Range Status Comment   Specimen Description BLOOD LEFT ANTECUBITAL   Final    Special Requests BOTTLES DRAWN AEROBIC AND ANAEROBIC 6CC   Final    Culture NO GROWTH 3 DAYS   Final    Report Status PENDING   Incomplete   CULTURE, BLOOD (ROUTINE X 2)     Status: Normal (Preliminary result)   Collection Time   07/15/11  7:40 PM      Component Value Range Status Comment   Specimen Description BLOOD LEFT HAND   Final    Special Requests BOTTLES DRAWN AEROBIC AND ANAEROBIC 6CC   Final    Culture NO GROWTH 3 DAYS   Final    Report Status PENDING   Incomplete   SURGICAL PCR SCREEN     Status: Normal  Collection Time   07/18/11 11:19 AM      Component Value Range Status Comment   MRSA, PCR NEGATIVE  NEGATIVE  Final    Staphylococcus aureus NEGATIVE  NEGATIVE  Final     Medical History: Past Medical History  Diagnosis Date  . No pertinent past medical history   . Postpartum pain 06/30/2011    Vaginal delivery, moderate bleeding, tender in abd, esp LLQ    Medications:  Scheduled:     . ampicillin (OMNIPEN) IV  2 g Intravenous Q6H  . clindamycin (CLEOCIN) IV  900 mg Intravenous Q8H  . docusate sodium  100 mg Oral BID  . furosemide      . gentamicin  6 mg/kg (Adjusted) Intravenous Q24H  . glycopyrrolate      . glycopyrrolate      . HYDROmorphone PCA 0.3 mg/mL   Intravenous Q4H  . HYDROmorphone PCA 0.3 mg/mL      . HYDROmorphone PCA 0.3 mg/mL      . HYDROmorphone PCA 0.3 mg/mL      . HYDROmorphone PCA 0.3 mg/mL      . lidocaine      . metroNIDAZOLE      . mupirocin ointment      . neostigmine      . ondansetron      . prenatal multivitamin  1 tablet Oral Daily  . propofol      . rocuronium      . rocuronium      . sodium chloride      . sodium chloride      . succinylcholine       . DISCONTD: gentamicin  6 mg/kg (Adjusted) Intravenous Q24H  . DISCONTD: ketorolac  30 mg Intravenous Q8H  . DISCONTD: metronidazole  500 mg Intravenous Q8H  . DISCONTD: mupirocin cream   Topical BID  . DISCONTD: ondansetron (ZOFRAN) IV  4 mg Intravenous Once  . DISCONTD: piperacillin-tazobactam (ZOSYN)  IV  3.375 g Intravenous Q8H  . DISCONTD: sodium chloride  500 mL Intravenous Once   Assessment: Empiric therapy. Ok for Once Daily protocol. Dose not given 07/17/10. Level drawn early this AM will not be accurate.  Goal of Therapy:  8 hour post dose level be plotted on graph to assess adequate clearance.  Plan:  Gentamicin 528 mg IV every 24 hours. Obtain random level in 8 hours.  Mady Gemma 07/19/2011,9:07 AM

## 2011-07-19 NOTE — Progress Notes (Signed)
Acetaminophen dose given for high temp

## 2011-07-19 NOTE — Progress Notes (Signed)
Report given to PJ on 300, pt transferred in bed by RN and Tech, pt stable at this time and VS WNL. Pt meds and belongings transported with pt.

## 2011-07-19 NOTE — Addendum Note (Signed)
Addendum  created 07/19/11 1304 by Minerva Areola, CRNA   Modules edited:Notes Section

## 2011-07-19 NOTE — Progress Notes (Signed)
1 Day Post-Op Left  LAPAROSCOPIC SALPINGO OOPHERECTOMY  Subjective: Patient reports nausea, incisional pain and tolerating PO.  Patient states she feels better than before surgery.  Objective: I have reviewed patient's vital signs, intake and output, medications and labs.   Filed Vitals:   07/19/11 0600 07/19/11 0700 07/19/11 0800 07/19/11 0900  BP: 116/63 109/59 115/67 126/73  Pulse: 110 112 104 103  Temp:  100.3 F (37.9 C)    TempSrc:  Oral    Resp: 28 34 25 39  Height:      Weight:      SpO2: 97% 96% 95% 97%    General: alert, cooperative and mild distress Resp: diminished breath sounds bibasilar Cardio: regular rate and rhythm and systolic murmur: holosystolic 2/6,   GI: abnormal findings:  absent bowel sounds and incision: clean, dry and intact Extremities: edema 3+   Recent Results (from the past 24 hour(s))  SURGICAL PCR SCREEN   Collection Time   07/18/11 11:19 AM      Component Value Range   MRSA, PCR NEGATIVE  NEGATIVE    Staphylococcus aureus NEGATIVE  NEGATIVE   TYPE AND SCREEN   Collection Time   07/18/11 11:50 AM      Component Value Range   ABO/RH(D) A POS     Antibody Screen NEG     Sample Expiration 07/21/2011     Unit Number [id number]     Blood Component Type RED CELLS,LR     Unit division 00     Status of Unit ALLOCATED     Transfusion Status OK TO TRANSFUSE     Crossmatch Result Compatible     Unit Number [id number]     Blood Component Type RED CELLS,LR     Unit division 00     Status of Unit ALLOCATED     Transfusion Status OK TO TRANSFUSE     Crossmatch Result Compatible    PREPARE RBC (CROSSMATCH)   Collection Time   07/18/11 11:50 AM      Component Value Range   Order Confirmation ORDER PROCESSED BY BLOOD BANK    POCT I-STAT 4, (NA,K, GLUC, HGB,HCT)   Collection Time   07/18/11  2:18 PM      Component Value Range   Sodium 140  135 - 145 (mEq/L)   Potassium 4.2  3.5 - 5.1 (mEq/L)   Glucose, Bld 102 (*) 70 - 99 (mg/dL)   HCT 78.4 (*)  69.6 - 46.0 (%)   Hemoglobin 9.2 (*) 12.0 - 15.0 (g/dL)  BASIC METABOLIC PANEL   Collection Time   07/18/11  5:04 PM      Component Value Range   Sodium 138  135 - 145 (mEq/L)   Potassium 4.2  3.5 - 5.1 (mEq/L)   Chloride 105  96 - 112 (mEq/L)   CO2 24  19 - 32 (mEq/L)   Glucose, Bld 109 (*) 70 - 99 (mg/dL)   BUN 3 (*) 6 - 23 (mg/dL)   Creatinine, Ser 2.95  0.50 - 1.10 (mg/dL)   Calcium 8.7  8.4 - 28.4 (mg/dL)   GFR calc non Af Amer 87 (*) >90 (mL/min)   GFR calc Af Amer >90  >90 (mL/min)  BLOOD GAS, ARTERIAL   Collection Time   07/18/11  7:00 PM      Component Value Range   O2 Content 2.0     Delivery systems NASAL CANNULA     pH, Arterial 7.402 (*) 7.350 - 7.400  pCO2 arterial 35.7  35.0 - 45.0 (mmHg)   pO2, Arterial 80.6  80.0 - 100.0 (mmHg)   Bicarbonate 21.8  20.0 - 24.0 (mEq/L)   TCO2 20.2  0 - 100 (mmol/L)   Acid-base deficit 2.2 (*) 0.0 - 2.0 (mmol/L)   O2 Saturation 95.9     Patient temperature 37.0     Collection site RIGHT RADIAL     Drawn by COLLECTED BY RT     Sample type ARTERIAL     Allens test (pass/fail) PASS  PASS   HEPATIC FUNCTION PANEL   Collection Time   07/18/11  7:28 PM      Component Value Range   Total Protein 6.3  6.0 - 8.3 (g/dL)   Albumin 1.6 (*) 3.5 - 5.2 (g/dL)   AST 16  0 - 37 (U/L)   ALT 12  0 - 35 (U/L)   Alkaline Phosphatase 155 (*) 39 - 117 (U/L)   Total Bilirubin 0.6  0.3 - 1.2 (mg/dL)   Bilirubin, Direct 0.3  0.0 - 0.3 (mg/dL)   Indirect Bilirubin 0.3  0.3 - 0.9 (mg/dL)  MAGNESIUM   Collection Time   07/18/11  7:28 PM      Component Value Range   Magnesium 1.4 (*) 1.5 - 2.5 (mg/dL)  CBC   Collection Time   07/18/11  7:28 PM      Component Value Range   WBC 20.4 (*) 4.0 - 10.5 (K/uL)   RBC 3.23 (*) 3.87 - 5.11 (MIL/uL)   Hemoglobin 8.7 (*) 12.0 - 15.0 (g/dL)   HCT 60.4 (*) 54.0 - 46.0 (%)   MCV 78.9  78.0 - 100.0 (fL)   MCH 26.9  26.0 - 34.0 (pg)   MCHC 34.1  30.0 - 36.0 (g/dL)   RDW 98.1 (*) 19.1 - 15.5 (%)   Platelets 355   150 - 400 (K/uL)  DIFFERENTIAL   Collection Time   07/18/11  7:28 PM      Component Value Range   Neutrophils Relative 92 (*) 43 - 77 (%)   Lymphocytes Relative 5 (*) 12 - 46 (%)   Monocytes Relative 3  3 - 12 (%)   Eosinophils Relative 0  0 - 5 (%)   Basophils Relative 0  0 - 1 (%)   Neutro Abs 18.8 (*) 1.7 - 7.7 (K/uL)   Lymphs Abs 1.0  0.7 - 4.0 (K/uL)   Monocytes Absolute 0.6  0.1 - 1.0 (K/uL)   Eosinophils Absolute 0.0  0.0 - 0.7 (K/uL)   Basophils Absolute 0.0  0.0 - 0.1 (K/uL)  LACTIC ACID, PLASMA   Collection Time   07/18/11  7:29 PM      Component Value Range   Lactic Acid, Venous 0.9  0.5 - 2.2 (mmol/L)  PROCALCITONIN   Collection Time   07/18/11  7:29 PM      Component Value Range   Procalcitonin 4.82    PRO B NATRIURETIC PEPTIDE   Collection Time   07/18/11  7:29 PM      Component Value Range   Pro B Natriuretic peptide (BNP) 215.7 (*) 0 - 125 (pg/mL)  CBC   Collection Time   07/19/11  5:35 AM      Component Value Range   WBC 21.1 (*) 4.0 - 10.5 (K/uL)   RBC 3.00 (*) 3.87 - 5.11 (MIL/uL)   Hemoglobin 8.1 (*) 12.0 - 15.0 (g/dL)   HCT 47.8 (*) 29.5 - 46.0 (%)   MCV 79.0  78.0 -  100.0 (fL)   MCH 27.0  26.0 - 34.0 (pg)   MCHC 34.2  30.0 - 36.0 (g/dL)   RDW 40.9 (*) 81.1 - 15.5 (%)   Platelets 378  150 - 400 (K/uL)     Assessment: s/p  LAPAROSCOPIC SALPINGO OOPHERECTOMY due to tubo ovarian abscess and thrombosed ovarian vein: stable, fever and anemia  Plan: Continue triples.  Would expect patient to improve over next 48 hours or so.  Otherwise routine post op care.  If blood counts fall any further will transfuse in light of severe infectious process  LOS: 5 days    EURE,LUTHER H 07/19/2011, 9:06 AM

## 2011-07-19 NOTE — Anesthesia Postprocedure Evaluation (Signed)
Anesthesia Post Note  Patient: [name]  Procedure(s) Performed:  LAPAROSCOPIC SALPINGO OOPHERECTOMY  Anesthesia type: General  Patient location: ICU 7  Post pain: Pain level controlled  Post assessment: Post-op Vital signs reviewed, Patient's Cardiovascular Status Stable, Respiratory Function Stable, Patent Airway, No signs of Nausea or vomiting and Pain level controlled  Last Vitals:  Filed Vitals:   07/19/11 1200  BP:   Pulse:   Temp:   Resp: 33    Post vital signs: Reviewed and stable  Level of consciousness: awake and alert   Complications: No apparent anesthesia complications

## 2011-07-19 NOTE — Progress Notes (Signed)
Patient received from ICU.  Patient in stable condition.  No problems noted.  Will monitor patient closely.

## 2011-07-20 LAB — BASIC METABOLIC PANEL
CO2: 26 mEq/L (ref 19–32)
Chloride: 105 mEq/L (ref 96–112)
Potassium: 4.4 mEq/L (ref 3.5–5.1)
Sodium: 136 mEq/L (ref 135–145)

## 2011-07-20 LAB — CULTURE, BLOOD (ROUTINE X 2): Culture: NO GROWTH

## 2011-07-20 LAB — CBC
HCT: 23 % — ABNORMAL LOW (ref 36.0–46.0)
Hemoglobin: 7.6 g/dL — ABNORMAL LOW (ref 12.0–15.0)
MCH: 26.6 pg (ref 26.0–34.0)
MCHC: 33 g/dL (ref 30.0–36.0)
MCV: 80.4 fL (ref 78.0–100.0)
Platelets: 372 K/uL (ref 150–400)
RBC: 2.86 MIL/uL — ABNORMAL LOW (ref 3.87–5.11)
RDW: 16.3 % — ABNORMAL HIGH (ref 11.5–15.5)
WBC: 18.2 K/uL — ABNORMAL HIGH (ref 4.0–10.5)

## 2011-07-20 LAB — DIFFERENTIAL
Lymphocytes Relative: 12 % (ref 12–46)
Lymphs Abs: 2.2 10*3/uL (ref 0.7–4.0)
Neutrophils Relative %: 79 % — ABNORMAL HIGH (ref 43–77)

## 2011-07-20 MED ORDER — SODIUM CHLORIDE 0.9 % IV SOLN
Freq: Once | INTRAVENOUS | Status: AC
  Administered 2011-07-20: 13:00:00 via INTRAVENOUS

## 2011-07-20 MED ORDER — SODIUM CHLORIDE 0.9 % IV SOLN: 250.0000 mL | INTRAVENOUS | Status: DC | PRN

## 2011-07-20 MED ORDER — SODIUM CHLORIDE 0.9 % IJ SOLN
3.0000 mL | Freq: Two times a day (BID) | INTRAMUSCULAR | Status: DC
  Administered 2011-07-21 – 2011-07-23 (×5): 3 mL via INTRAVENOUS
  Filled 2011-07-20 (×6): qty 3

## 2011-07-20 MED ORDER — FUROSEMIDE 10 MG/ML IJ SOLN
20.0000 mg | Freq: Once | INTRAMUSCULAR | Status: AC
  Administered 2011-07-20: 20 mg via INTRAVENOUS
  Filled 2011-07-20: qty 2

## 2011-07-20 MED ORDER — HYDROMORPHONE 0.3 MG/ML IV SOLN
INTRAVENOUS | Status: AC
  Filled 2011-07-20: qty 25

## 2011-07-20 MED ORDER — HYDROMORPHONE 0.3 MG/ML IV SOLN
INTRAVENOUS | Status: AC
  Administered 2011-07-20: 22:00:00
  Filled 2011-07-20: qty 25

## 2011-07-20 MED ORDER — SODIUM CHLORIDE 0.9 % IJ SOLN
INTRAMUSCULAR | Status: AC
  Administered 2011-07-20: 3 mL
  Filled 2011-07-20: qty 3

## 2011-07-20 MED ORDER — SODIUM CHLORIDE 0.9 % IJ SOLN: 3.0000 mL | INTRAMUSCULAR | Status: DC | PRN

## 2011-07-20 MED ORDER — ACETAMINOPHEN 325 MG PO TABS
650.0000 mg | ORAL_TABLET | Freq: Once | ORAL | Status: AC
  Administered 2011-07-20: 650 mg via ORAL
  Filled 2011-07-20: qty 2

## 2011-07-20 MED ORDER — DIPHENHYDRAMINE HCL 25 MG PO CAPS
25.0000 mg | ORAL_CAPSULE | Freq: Once | ORAL | Status: AC
  Administered 2011-07-20: 25 mg via ORAL
  Filled 2011-07-20: qty 1

## 2011-07-20 NOTE — Progress Notes (Signed)
Patient ambulated to bathroom this shift with one assist and tolerated well.  Patient had small BM.

## 2011-07-20 NOTE — Op Note (Signed)
Preoperative diagnosis:  1.  Left tubo-ovarian complex/abscess                                         2.  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  unresponsive to aggressive antibiotic therapy x greater than 72 hours  Postoperative diagnosis:  Same as above  Procedure:  Laparoscopic left salpingo-oophorectomy with evacuation of abscess and lysis of adhesions  Surgeon:  Rockne Coons MD  Anesthesia:  Gen. Endotracheal  Findings:  This is a very unusual clinical picture.  As it he has been having fevers essentially within a couple of days of her delivery nearly a month ago.  She's been followed as an outpatient with no real localizing or other constitutional symptoms other than fevers.  In the postpartum period the usual areas are breast and endometritis as the typical sources of fever.  She never really showed any evidence of this until the beginning of last week when she had a little bit of lower, or pain nothing very impressive.  We treated her with Keflex and Cipro and prior to that R. thought was it was viral in origin.  White blood cell counts were in the 11-12,000 range.  However that all changed in January 31 when the patient came to the ER with sudden onset of left lower quadrant pain.  Evaluation showed a white count of be 12,000 temperature 102.9 and a sonogram revealed a complex edematous ill-defined process in the left lower quadrant quite possibly a tubo-ovarian complex and there was a suspicion of adnexal torsion with no flow.  My suspicion was given this new information and sudden onset of left lower quadrant pain, that the patient had been most likely suffering with some form of septic pelvic thrombophlebitis and she thrombosed her ovarian vein but it is infectious process had sort of been brewing essentially since delivery.    At the time of surgery the patient does indeed have a very dramatic process in the left adnexa with edematous  tissue purulent exudate frank pus and an involved left ovary and tube.  It is difficult for me to definitively determine if indeed her left ovarian vasculature was clotted all I treated her with Lovenox for the past 3-1/2 days in the hopes that if indeed was septic pelvic thrombophlebitis that she would respond.  But at the time of surgery I am unable to definitively say whether or not that is the case.  I am suspicious but certainly got above any clot with my harmonic scalpel and should not be part of the postoperative clinical picture.  Her uterus right ovary and tube appeared to be uninfected except to the extent of the processes going on in the pelvis, the right tube and ovary appear completely normal. The omentum is involved of course but only secondarily there does not appear to be any diverticular disease diverticulitis or bowel involvement except again secondarily to the process.  Description of operation:  The patient was taken to the operating room and placed in this supine position where she underwent general endotracheal anesthesia.  Her abdomen pelvis lower thighs and vagina were prepped and draped in  the usual sterile fashion anticipation of a laparoscopic surgery.  A Foley catheter was placed.  An incision was made in the umbilicus and the peritoneal cavity was insufflated using a varies needle.  A non-bladed video laparoscope trocar was used and placed into the peritoneal cavity with one pass without difficulty.  The peritoneal cavity was confirmed. An incision was made in the midline suprapubic area and a 5 mm trocar was placed under direct visualization without difficulty. An incision was made in the right lower quadrant and a 5 mm non-bladed trocar was placed under direct visualization again without difficulty. The harmonic scalpel was used and adhesions of the omentum were taken down. I spent a great deal of time probably 30-45 minutes dissecting out the left lower quadrant separating the  omentum from the uterus from the sigmoid and the left ovary and tube from the pelvic sidewall and the sigmoid colon. There was a moderate amount of pus in the area that was liberated when these adhesions were taken down but again the sigmoid was examined closely I do not think there was any diverticular disease and diverticulitis present. The harmonic scalpel was used and the ovary and tube were removed again was some difficulty in a piecemeal fashion because they were so edematous swollen and typical tissue reaction from the infection itself.  The infundibulopelvic ligament was transected just as it comes across the pelvic brim the ureter was identified in well out of the way of the dissection and began great care was taken to avoid injury to the sigmoid colon and the pelvic sidewall structures. Because the tissue was involved with this infectious process hemostasis was but of a challenge because everything wanted to ooze.  I used to pressure and Surgicel in order to obtain hemostasis I irrigated the pelvis with greater than 2 L of fluid to try to diminish the pus and infectious loaded in the peritoneal cavity.  I was able to carry out the entire procedure laparoscopically although I did have to make an incision in the left lower quadrant as well and placed a trocar and have a second person assist with the surgery.  Again I viewed the pedicles for quite some time before making a decision that there was sufficient hemostasis. At this point all the trochars were removed after the the tube and ovary had been removed using an Endo Catch from the right lower quadrant incision.  The umbilical and right lower quadrant fascia was closed using 0 Vicryl subcutaneous tissue sutures were placed.  All incisions were closed with skin staples. The patient tolerated procedure well there was approximately 200 cc of blood loss for the procedure.  She did not receive any prophylactic antibiotics and she was artery and Zosyn and  Flagyl. The patient had been off of Lovenox her. Her 24-hour preoperatively. She did receive Toradol. All sponge instrument needle counts were correct.   EURE,LUTHER H 10:40 PM 07/20/2011

## 2011-07-20 NOTE — Progress Notes (Signed)
Resting in bed. First unit of blood transfusing with no difficulties. No s/s of reaction. VS stable. Nursing to continue to monitor.

## 2011-07-20 NOTE — Progress Notes (Signed)
2 Days Post-Op  LAPAROSCOPIC SALPINGO OOPHERECTOMY  Subjective: Patient reports feels better, voided x 2, up and ambulating   Objective: I have reviewed patient's vital signs, intake and output, medications and labs. Filed Vitals:   07/20/11 0204 07/20/11 0400 07/20/11 0516 07/20/11 0817  BP: 123/71  117/76   Pulse: 112  102   Temp: 99.6 F (37.6 C)  98.4 F (36.9 C)   TempSrc: Oral  Oral   Resp: 26 20 24 25   Height:      Weight:   300 lb 14.9 oz (136.5 kg)   SpO2: 100% 96% 93% 96%   Lungs diminished breath sounds bilaterally CV RRR with II/VI murmur, flow Abdomen soft normal [pst op all incisions clean dry and intact  Recent Results (from the past 24 hour(s))  GENTAMICIN LEVEL, RANDOM   Collection Time   07/19/11  4:41 PM      Component Value Range   Gentamicin Rm 4.5    CBC   Collection Time   07/20/11  6:00 AM      Component Value Range   WBC 18.2 (*) 4.0 - 10.5 (K/uL)   RBC 2.86 (*) 3.87 - 5.11 (MIL/uL)   Hemoglobin 7.6 (*) 12.0 - 15.0 (g/dL)   HCT 95.2 (*) 84.1 - 46.0 (%)   MCV 80.4  78.0 - 100.0 (fL)   MCH 26.6  26.0 - 34.0 (pg)   MCHC 33.0  30.0 - 36.0 (g/dL)   RDW 32.4 (*) 40.1 - 15.5 (%)   Platelets 372  150 - 400 (K/uL)  DIFFERENTIAL   Collection Time   07/20/11  6:00 AM      Component Value Range   Neutrophils Relative 79 (*) 43 - 77 (%)   Neutro Abs 14.4 (*) 1.7 - 7.7 (K/uL)   Lymphocytes Relative 12  12 - 46 (%)   Lymphs Abs 2.2  0.7 - 4.0 (K/uL)   Monocytes Relative 8  3 - 12 (%)   Monocytes Absolute 1.5 (*) 0.1 - 1.0 (K/uL)   Eosinophils Relative 0  0 - 5 (%)   Eosinophils Absolute 0.1  0.0 - 0.7 (K/uL)   Basophils Relative 1  0 - 1 (%)   Basophils Absolute 0.1  0.0 - 0.1 (K/uL)   RBC Morphology POLYCHROMASIA PRESENT    BASIC METABOLIC PANEL   Collection Time   07/20/11  6:00 AM      Component Value Range   Sodium 136  135 - 145 (mEq/L)   Potassium 4.4  3.5 - 5.1 (mEq/L)   Chloride 105  96 - 112 (mEq/L)   CO2 26  19 - 32 (mEq/L)   Glucose, Bld  111 (*) 70 - 99 (mg/dL)   BUN 3 (*) 6 - 23 (mg/dL)   Creatinine, Ser 0.27  0.50 - 1.10 (mg/dL)   Calcium 8.6  8.4 - 25.3 (mg/dL)   GFR calc non Af Amer >90  >90 (mL/min)   GFR calc Af Amer >90  >90 (mL/min)    Assessment: s/p  LAPAROSCOPIC SALPINGO OOPHERECTOMY: stable and tolerating diet  Plan: Advance diet Encourage ambulation Continue ABX therapy due to Post-op infection/ TOA Will transfuse 2 units PRBC due to anemia and febrile illness  LOS: 6 days    Corinthia Helmers H 07/20/2011, 11:47 AM

## 2011-07-20 NOTE — Progress Notes (Signed)
Ambulated in hall without oxygen. Sats decreased 78-80%. Reapplied O2 2L Bee. Sats increased to 95%. Continued to ambulate in hallway. Tolerating well.

## 2011-07-20 NOTE — Progress Notes (Signed)
ANTIBIOTIC CONSULT NOTE  Pharmacy Consult for Gentamicin Indication: Pelvic abscess  No Known Allergies  Patient Measurements: Height: 5\' 6"  (167.6 cm) Weight: 300 lb 14.9 oz (136.5 kg) IBW/kg (Calculated) : 59.3  Adjusted Body Weight:100 kg Vital Signs: Temp: 98.4 F (36.9 C) (02/06 0516) Temp src: Oral (02/06 0516) BP: 117/76 mmHg (02/06 0516) Pulse Rate: 102  (02/06 0516) Intake/Output from previous day: 02/05 0701 - 02/06 0700 In: 3943.8 [P.O.:660; I.V.:2977.8; IV Piggyback:306] Out: 1950 [Urine:1950] Intake/Output from this shift:    Labs:  Results for Barbara, [name] (MRN [id number]) as of 07/20/2011 07:49  Ref. Range 07/18/2011 19:29 07/19/2011 01:30 07/19/2011 05:35 07/19/2011 16:41 07/20/2011 06:00  Gentamicin Rm No range found  <0.2  4.5     Basename 07/20/11 0600 07/19/11 0535 07/18/11 1928 07/18/11 1704 07/18/11 0505  WBC 18.2* 21.1* 20.4* -- --  HGB 7.6* 8.1* 8.7* -- --  PLT 372 378 355 -- --  LABCREA -- -- -- -- --  CREATININE 0.87 -- -- 0.90 0.83   Estimated Creatinine Clearance: 139.5 ml/min (by C-G formula based on Cr of 0.87).   Microbiology: Recent Results (from the past 720 hour(s))  URINE CULTURE     Status: Normal   Collection Time   07/02/11  9:20 PM      Component Value Range Status Comment   Specimen Description URINE, CATHETERIZED   Final    Special Requests NONE   Final    Culture  Setup Time [id number]   Final    Colony Count NO GROWTH   Final    Culture NO GROWTH   Final    Report Status 07/05/2011 FINAL   Final   URINE CULTURE     Status: Normal   Collection Time   07/14/11  2:42 PM      Component Value Range Status Comment   Specimen Description URINE, CATHETERIZED   Final    Special Requests NONE   Final    Culture  Setup Time [id number]   Final    Colony Count NO GROWTH   Final    Culture NO GROWTH   Final    Report Status 07/16/2011 FINAL   Final   WET PREP, GENITAL     Status: Abnormal   Collection Time   07/14/11  2:56 PM   Component Value Range Status Comment   Yeast Wet Prep HPF POC NONE SEEN  NONE SEEN  Final    Trich, Wet Prep NONE SEEN  NONE SEEN  Final    Clue Cells Wet Prep HPF POC NONE SEEN  NONE SEEN  Final    WBC, Wet Prep HPF POC TOO NUMEROUS TO COUNT (*) NONE SEEN  Final   CULTURE, BLOOD (ROUTINE X 2)     Status: Normal (Preliminary result)   Collection Time   07/15/11  7:30 PM      Component Value Range Status Comment   Specimen Description BLOOD LEFT ANTECUBITAL   Final    Special Requests BOTTLES DRAWN AEROBIC AND ANAEROBIC 6CC   Final    Culture NO GROWTH 4 DAYS   Final    Report Status PENDING   Incomplete   CULTURE, BLOOD (ROUTINE X 2)     Status: Normal (Preliminary result)   Collection Time   07/15/11  7:40 PM      Component Value Range Status Comment   Specimen Description BLOOD LEFT HAND   Final    Special Requests BOTTLES DRAWN AEROBIC AND ANAEROBIC 6CC  Final    Culture NO GROWTH 4 DAYS   Final    Report Status PENDING   Incomplete   SURGICAL PCR SCREEN     Status: Normal   Collection Time   07/18/11 11:19 AM      Component Value Range Status Comment   MRSA, PCR NEGATIVE  NEGATIVE  Final    Staphylococcus aureus NEGATIVE  NEGATIVE  Final    Medical History: Past Medical History  Diagnosis Date  . No pertinent past medical history   . Postpartum pain 06/30/2011    Vaginal delivery, moderate bleeding, tender in abd, esp LLQ   Medications:  Scheduled:     . ampicillin (OMNIPEN) IV  2 g Intravenous Q6H  . clindamycin (CLEOCIN) IV  900 mg Intravenous Q8H  . docusate sodium  100 mg Oral BID  . furosemide  20 mg Intravenous Once  . gentamicin  6 mg/kg (Adjusted) Intravenous Q24H  . HYDROmorphone PCA 0.3 mg/mL   Intravenous Q4H  . HYDROmorphone PCA 0.3 mg/mL      . HYDROmorphone PCA 0.3 mg/mL      . HYDROmorphone PCA 0.3 mg/mL      . prenatal multivitamin  1 tablet Oral Daily  . DISCONTD: gentamicin  6 mg/kg (Adjusted) Intravenous Q24H   Assessment: Disregard Gent level of <  1 (inaccurate) Appropriate Gent level is 4.5 and confirms current dosing regimen is appropriate. OK to continue extended interval dosing.  Good Gentamicin clearance  Goal of Therapy:  Eradicate infection.  Plan:  Gentamicin 528 mg IV every 24 hours. Labs per protocol  Valrie Hart A 07/20/2011,7:47 AM

## 2011-07-21 LAB — CBC
HCT: 27.1 % — ABNORMAL LOW (ref 36.0–46.0)
Hemoglobin: 9.1 g/dL — ABNORMAL LOW (ref 12.0–15.0)
MCH: 27.4 pg (ref 26.0–34.0)
MCHC: 33.6 g/dL (ref 30.0–36.0)
MCV: 81.6 fL (ref 78.0–100.0)

## 2011-07-21 LAB — TYPE AND SCREEN
ABO/RH(D): A POS
Unit division: 0

## 2011-07-21 LAB — BASIC METABOLIC PANEL
BUN: 3 mg/dL — ABNORMAL LOW (ref 6–23)
CO2: 28 mEq/L (ref 19–32)
GFR calc non Af Amer: 86 mL/min — ABNORMAL LOW (ref >90)
Glucose, Bld: 116 mg/dL — ABNORMAL HIGH (ref 70–99)
Potassium: 4.3 mEq/L (ref 3.5–5.1)

## 2011-07-21 MED ORDER — BOOST / RESOURCE BREEZE PO LIQD
1.0000 | Freq: Every day | ORAL | Status: DC
  Administered 2011-07-21 – 2011-07-23 (×3): 1 via ORAL
  Filled 2011-07-21 (×5): qty 1

## 2011-07-21 NOTE — Progress Notes (Signed)
Vomited approx. 500 ml of dark Reder emesis in basin. States feels better. Will notify Dr Despina Hidden on rounds. Has hypoactive to very faint bowel sounds.

## 2011-07-21 NOTE — Progress Notes (Signed)
Ambulated in halls again. Pulse ox reads 85-90% on room air. Recheck sats after back in room. Sats 95% on room air. Encouraged to use IS.

## 2011-07-21 NOTE — Progress Notes (Signed)
INITIAL ADULT NUTRITION ASSESSMENT Date: 07/21/2011   Time: 5:17 PM Reason for Assessment: Length of Stay, Poor PO intake  ASSESSMENT: Female 26 y.o.  Dx: Post-Op 2 days Laparoscopic left salpingo-oophorectomy with evacuation of abscess and lysis of adhesions  Hx:  Past Medical History  Diagnosis Date  . No pertinent past medical history   . Postpartum pain 06/30/2011    Vaginal delivery, moderate bleeding, tender in abd, esp LLQ   Related Meds:     . ampicillin (OMNIPEN) IV  2 g Intravenous Q6H  . clindamycin (CLEOCIN) IV  900 mg Intravenous Q8H  . docusate sodium  100 mg Oral BID  . furosemide  20 mg Intravenous Once  . gentamicin  6 mg/kg (Adjusted) Intravenous Q24H  . HYDROmorphone PCA 0.3 mg/mL      . prenatal multivitamin  1 tablet Oral Daily  . sodium chloride  3 mL Intravenous Q12H  . sodium chloride      . DISCONTD: HYDROmorphone PCA 0.3 mg/mL   Intravenous Q4H    Ht: 5\' 6"  (167.6 cm)  Wt: 300 lb 11.3 oz (136.4 kg)  Ideal Wt: 59 kg % Ideal Wt: 231%  Usual Wt: 270 lb/ 122.7 kg (per pt report before pregnancy) % Usual Wt: 111%  Body mass index is 48.54 kg/(m^2). Obesity Class III  Food/Nutrition Related Hx: Pt is 3 weeks post partum; pt has been breastfeeding with no complaints per H&P.   Labs:  CMP     Component Value Date/Time   NA 135 07/21/2011 0445   K 4.3 07/21/2011 0445   CL 101 07/21/2011 0445   CO2 28 07/21/2011 0445   GLUCOSE 116* 07/21/2011 0445   BUN 3* 07/21/2011 0445   CREATININE 0.91 07/21/2011 0445   CALCIUM 9.1 07/21/2011 0445   PROT 6.3 07/18/2011 1928   ALBUMIN 1.6* 07/18/2011 1928   AST 16 07/18/2011 1928   ALT 12 07/18/2011 1928   ALKPHOS 155* 07/18/2011 1928   BILITOT 0.6 07/18/2011 1928   GFRNONAA 86* 07/21/2011 0445   GFRAA >90 07/21/2011 0445     Intake/Output Summary (Last 24 hours) at 07/21/11 1720 Last data filed at 07/21/11 1700  Gross per 24 hour  Intake    483 ml  Output   5250 ml  Net  -4767 ml    Diet Order: Regular  Supplements/Tube  Feeding: N/A  IVF:    dextrose 5 % and 0.45 % NaCl with KCl 20 mEq/L Last Rate: 125 mL/hr at 07/21/11 1639    Estimated Nutritional Needs:   Kcal: 2000-2300 Protein: 110-120 grams  Fluid: >2.0 L  Pt has been experiencing n/v for the last 24 hours. Pt reports she does not like any of the food and it all tastes horrible to her. Pt did state she liked the roast beef she had for dinner a few night prior. PTA pt reports a 15 lb weight loss in the last 2 weeks. Documented 0% PO intake for today.  NUTRITION DIAGNOSIS: -Inadequate oral intake (NI-2.1).  Status: Ongoing  RELATED TO: poor appetite  AS EVIDENCE BY: n/v for last 24 hours  MONITORING/EVALUATION(Goals): Goal: PO intake to meet >90% of estimated nutrition needs. Promote weight maintenance Monitor: PO intake, weight trends, labs,I/O's  EDUCATION NEEDS: -No education needs identified at this time  INTERVENTION:  Order resource breeze once daily to promote energy and protein intake  Recommend bland diet if n/v persists  RD to follow for nutrition care plan  Dietitian #:[phone]  DOCUMENTATION CODES Per approved  criteria  -Morbid Obesity    Karenann Cai 07/21/2011, 5:17 PM Royann Shivers Pager [phone]

## 2011-07-21 NOTE — Progress Notes (Signed)
Ambulating in hall with nurse. Room air. Sats 96%. No SOB. Tolerating well. Denies pain. Assisted back to room. Sitting up in chair. Gave warm blanket. Tearful. When asked what was wrong states " I miss my kids". Reassured   That she was making progress and would be able to see them soon.

## 2011-07-21 NOTE — Progress Notes (Signed)
Vomited 1300 ml of dark Valls emesis. Medicated with Zofran for nausea. Instructed to ambulate more often than she has been. Encouraged to walk frequently but appears not to want to cooperate. Explain what an ileus is .

## 2011-07-22 ENCOUNTER — Inpatient Hospital Stay (HOSPITAL_COMMUNITY): Payer: Medicaid Other

## 2011-07-22 LAB — BASIC METABOLIC PANEL
BUN: 3 mg/dL — ABNORMAL LOW (ref 6–23)
CO2: 28 mEq/L (ref 19–32)
Calcium: 8.9 mg/dL (ref 8.4–10.5)
Creatinine, Ser: 1.01 mg/dL (ref 0.50–1.10)
GFR calc non Af Amer: 76 mL/min — ABNORMAL LOW (ref >90)
Glucose, Bld: 108 mg/dL — ABNORMAL HIGH (ref 70–99)
Sodium: 138 mEq/L (ref 135–145)

## 2011-07-22 LAB — CBC
MCH: 27.8 pg (ref 26.0–34.0)
MCHC: 34.2 g/dL (ref 30.0–36.0)
MCV: 81.4 fL (ref 78.0–100.0)
Platelets: 459 10*3/uL — ABNORMAL HIGH (ref 150–400)
RDW: 16.6 % — ABNORMAL HIGH (ref 11.5–15.5)

## 2011-07-22 MED ORDER — BIOTENE DRY MOUTH MT LIQD
15.0000 mL | Freq: Two times a day (BID) | OROMUCOSAL | Status: DC
  Administered 2011-07-22 – 2011-07-29 (×16): 15 mL via OROMUCOSAL

## 2011-07-22 MED ORDER — CLONIDINE HCL 0.1 MG PO TABS: 0.1000 mg | ORAL_TABLET | Freq: Two times a day (BID) | ORAL | Status: DC

## 2011-07-22 MED ORDER — NYSTATIN 100000 UNIT/ML MT SUSP
5.0000 mL | Freq: Three times a day (TID) | OROMUCOSAL | Status: DC
  Administered 2011-07-22 – 2011-07-23 (×6): 500000 [IU] via ORAL
  Filled 2011-07-22 (×6): qty 5

## 2011-07-22 MED ORDER — SODIUM CHLORIDE 0.9 % IJ SOLN
INTRAMUSCULAR | Status: AC
  Administered 2011-07-22: 10 mL
  Filled 2011-07-22: qty 3

## 2011-07-22 MED ORDER — CYCLOBENZAPRINE HCL 10 MG PO TABS
10.0000 mg | ORAL_TABLET | Freq: Three times a day (TID) | ORAL | Status: DC | PRN
  Administered 2011-07-22 – 2011-07-23 (×3): 10 mg via ORAL
  Filled 2011-07-22 (×3): qty 1

## 2011-07-22 NOTE — Progress Notes (Signed)
4 Days Post-Op  LAPAROSCOPIC SALPINGO OOPHERECTOMY  Subjective: Patient reports no abdominal incisional pain but some muscular back pain on the right. She is tolerating some liquids, last had emesisi yesterday at 1630 or so, bile.  Ambulatory   Objective: I have reviewed patient's vital signs, intake and output, medications, labs and pathology.  Filed Vitals:   07/21/11 1701 07/21/11 2140 07/22/11 0440 07/22/11 0519  BP:  130/83  119/75  Pulse:  104  103  Temp:  99 F (37.2 C)  99.2 F (37.3 C)  TempSrc:  Oral  Oral  Resp:  20  20  Height:      Weight:   292 lb 1.8 oz (132.5 kg)   SpO2: 85% 90%  92%    General: alert, cooperative and no distress Resp: diminished breath sounds bilaterally Cardio: systolic murmur: flow 2/6, harsh at 2nd left intercostal space GI: soft, non-tender; bowel sounds normal; no masses,  no organomegaly  Recent Results (from the past 24 hour(s))  BASIC METABOLIC PANEL   Collection Time   07/22/11  5:30 AM      Component Value Range   Sodium 138  135 - 145 (mEq/L)   Potassium 4.1  3.5 - 5.1 (mEq/L)   Chloride 101  96 - 112 (mEq/L)   CO2 28  19 - 32 (mEq/L)   Glucose, Bld 108 (*) 70 - 99 (mg/dL)   BUN 3 (*) 6 - 23 (mg/dL)   Creatinine, Ser 7.82  0.50 - 1.10 (mg/dL)   Calcium 8.9  8.4 - 95.6 (mg/dL)   GFR calc non Af Amer 76 (*) >90 (mL/min)   GFR calc Af Amer 88 (*) >90 (mL/min)  CBC   Collection Time   07/22/11  5:30 AM      Component Value Range   WBC 17.0 (*) 4.0 - 10.5 (K/uL)   RBC 3.45 (*) 3.87 - 5.11 (MIL/uL)   Hemoglobin 9.6 (*) 12.0 - 15.0 (g/dL)   HCT 21.3 (*) 08.6 - 46.0 (%)   MCV 81.4  78.0 - 100.0 (fL)   MCH 27.8  26.0 - 34.0 (pg)   MCHC 34.2  30.0 - 36.0 (g/dL)   RDW 57.8 (*) 46.9 - 15.5 (%)   Platelets 459 (*) 150 - 400 (K/uL)    Assessment: s/p  LAPAROSCOPIC SALPINGO OOPHERECTOMY: stable and progressing well  Plan: Advance diet Encourage ambulation  Continue antibiotics no fever for 72 hours almost Patient improving  slowly  Check CXR, I think just atelectasis  LOS: 8 days    EURE,LUTHER H 07/22/2011, 8:34 AM

## 2011-07-22 NOTE — Progress Notes (Signed)
ANTIBIOTIC CONSULT NOTE  Pharmacy Consult for Gentamicin Indication: Pelvic abscess  No Known Allergies  Patient Measurements: Height: 5\' 6"  (167.6 cm) Weight: 292 lb 1.8 oz (132.5 kg) IBW/kg (Calculated) : 59.3  Adjusted Body Weight:100 kg Vital Signs: Temp: 99.2 F (37.3 C) (02/08 0519) Temp src: Oral (02/08 0519) BP: 119/75 mmHg (02/08 0519) Pulse Rate: 103  (02/08 0519) Intake/Output from previous day: 02/07 0701 - 02/08 0700 In: 2390.3 [P.O.:240; I.V.:1327.1; IV Piggyback:823.2] Out: 3676 [Urine:1875; Emesis/NG output:1800; Stool:1] Intake/Output from this shift:    Labs:  Results for [name], [name] (MRN [id number]) as of 07/20/2011 07:49  Ref. Range 07/18/2011 19:29 07/19/2011 01:30 07/19/2011 05:35 07/19/2011 16:41 07/20/2011 06:00  Gentamicin Rm No range found  <0.2  4.5     Basename 07/22/11 0530 07/21/11 0445 07/20/11 0600  WBC 17.0* 15.4* 18.2*  HGB 9.6* 9.1* 7.6*  PLT 459* 400 372  LABCREA -- -- --  CREATININE 1.01 0.91 0.87   Estimated Creatinine Clearance: 118.1 ml/min (by C-G formula based on Cr of 1.01).   Microbiology: Recent Results (from the past 720 hour(s))  URINE CULTURE     Status: Normal   Collection Time   07/02/11  9:20 PM      Component Value Range Status Comment   Specimen Description URINE, CATHETERIZED   Final    Special Requests NONE   Final    Culture  Setup Time [id number]   Final    Colony Count NO GROWTH   Final    Culture NO GROWTH   Final    Report Status 07/05/2011 FINAL   Final   URINE CULTURE     Status: Normal   Collection Time   07/14/11  2:42 PM      Component Value Range Status Comment   Specimen Description URINE, CATHETERIZED   Final    Special Requests NONE   Final    Culture  Setup Time [id number]   Final    Colony Count NO GROWTH   Final    Culture NO GROWTH   Final    Report Status 07/16/2011 FINAL   Final   WET PREP, GENITAL     Status: Abnormal   Collection Time   07/14/11  2:56 PM      Component Value Range  Status Comment   Yeast Wet Prep HPF POC NONE SEEN  NONE SEEN  Final    Trich, Wet Prep NONE SEEN  NONE SEEN  Final    Clue Cells Wet Prep HPF POC NONE SEEN  NONE SEEN  Final    WBC, Wet Prep HPF POC TOO NUMEROUS TO COUNT (*) NONE SEEN  Final   CULTURE, BLOOD (ROUTINE X 2)     Status: Normal   Collection Time   07/15/11  7:30 PM      Component Value Range Status Comment   Specimen Description BLOOD LEFT ANTECUBITAL   Final    Special Requests BOTTLES DRAWN AEROBIC AND ANAEROBIC 6CC   Final    Culture NO GROWTH 5 DAYS   Final    Report Status 07/20/2011 FINAL   Final   CULTURE, BLOOD (ROUTINE X 2)     Status: Normal   Collection Time   07/15/11  7:40 PM      Component Value Range Status Comment   Specimen Description BLOOD LEFT HAND   Final    Special Requests BOTTLES DRAWN AEROBIC AND ANAEROBIC 6CC   Final    Culture NO GROWTH 5 DAYS  Final    Report Status 07/20/2011 FINAL   Final   SURGICAL PCR SCREEN     Status: Normal   Collection Time   07/18/11 11:19 AM      Component Value Range Status Comment   MRSA, PCR NEGATIVE  NEGATIVE  Final    Staphylococcus aureus NEGATIVE  NEGATIVE  Final    Medical History: Past Medical History  Diagnosis Date  . No pertinent past medical history   . Postpartum pain 06/30/2011    Vaginal delivery, moderate bleeding, tender in abd, esp LLQ   Medications:  Scheduled:     . ampicillin (OMNIPEN) IV  2 g Intravenous Q6H  . clindamycin (CLEOCIN) IV  900 mg Intravenous Q8H  . docusate sodium  100 mg Oral BID  . feeding supplement  1 Container Oral Daily  . gentamicin  6 mg/kg (Adjusted) Intravenous Q24H  . nystatin  5 mL Oral TID  . prenatal multivitamin  1 tablet Oral Daily  . sodium chloride  3 mL Intravenous Q12H   Assessment:  Afebrile. Renal function parameters are stable. CTX negative.   Goal of Therapy:  Eradicate infection.  Plan:  Gentamicin 528 mg IV every 24 hours. Labs per protocol  Caryl Asp 07/22/2011,9:05 AM

## 2011-07-22 NOTE — Progress Notes (Addendum)
Accidentally put in Clonidine 0.1 mg twice daily, starting now, in for this patient and it should have been for the patient in room 339, not 340.  When I was rechecking, I realized that I had the wrong patient file open so discontinued this from the patient in room 340 and put in the order on this patient.  BP was 191/71.  Dose will be given as soon as cleared by pharmacy.

## 2011-07-22 NOTE — Progress Notes (Signed)
Vomited 1200 ml dark [place] emesis, patient reported feeling better after vomiting, Dr. Despina Hidden notified, no new orders.

## 2011-07-23 LAB — BASIC METABOLIC PANEL
Calcium: 9.1 mg/dL (ref 8.4–10.5)
Creatinine, Ser: 1.05 mg/dL (ref 0.50–1.10)
GFR calc non Af Amer: 73 mL/min — ABNORMAL LOW (ref >90)
Sodium: 139 mEq/L (ref 135–145)

## 2011-07-23 LAB — CBC
MCV: 82.3 fL (ref 78.0–100.0)
Platelets: 463 10*3/uL — ABNORMAL HIGH (ref 150–400)
RBC: 3.28 MIL/uL — ABNORMAL LOW (ref 3.87–5.11)
RDW: 17.1 % — ABNORMAL HIGH (ref 11.5–15.5)
WBC: 15.1 10*3/uL — ABNORMAL HIGH (ref 4.0–10.5)

## 2011-07-23 MED ORDER — KCL IN DEXTROSE-NACL 20-5-0.45 MEQ/L-%-% IV SOLN
INTRAVENOUS | Status: DC
  Administered 2011-07-24: 11:00:00 via INTRAVENOUS
  Administered 2011-07-24: 1000 mL via INTRAVENOUS
  Administered 2011-07-24 – 2011-07-29 (×7): via INTRAVENOUS
  Administered 2011-07-29: 50 mL/h via INTRAVENOUS
  Administered 2011-07-30: 06:00:00 via INTRAVENOUS

## 2011-07-23 NOTE — Progress Notes (Signed)
Patient's oxygen saturations all day have been 97-100% on 2 liters.  I did a trial tonight and took her off to see if she could maintain her sats.  She dropped to the 70's within 10 minutes and her O2 was put back on.  The patient is taking very shallow breaths, does not want to walk, is reluctant to use the IS or the flutter valve.  I talked with the patient at length regarding the importance of using the IS, flutter valve, walking and taking deep breaths to get her lungs opened up.  She states she understands, but has been noncompliant.

## 2011-07-23 NOTE — Progress Notes (Signed)
5 Days Post-Op  LAPAROSCOPIC SALPINGO OOPHERECTOMY  Subjective: Patient reports tolerating PO and no problems voiding.  +BM no emesis  Objective: I have reviewed patient's vital signs, intake and output, medications, labs, microbiology, pathology and radiology results.  Filed Vitals:   07/22/11 2208 07/22/11 2300 07/23/11 0700 07/23/11 1436  BP: 137/83   111/74  Pulse: 103 96  78  Temp: 99.2 F (37.3 C)   98.4 F (36.9 C)  TempSrc: Oral   Oral  Resp: 20 20  19   Height:      Weight:      SpO2: 90% 96% 97% 96%    General: alert, cooperative and no distress Resp: diminished breath sounds bilaterally Cardio: regular rate and rhythm, S1, S2 normal, no murmur, click, rub or gallop GI: soft, non-tender; bowel sounds normal; no masses,  no organomegaly and incision: clean, dry and intact  Dg Chest 2 View  07/22/2011  *RADIOLOGY REPORT*  Clinical Data: Tachypnea, diminished oxygen saturation  CHEST - 2 VIEW  Comparison: Portable chest x-ray of 07/18/2011  Findings: The lungs are not well aerated.  There is opacity particularly at the left lung base most consistent with atelectasis.  Pneumonia would be difficult to exclude.  No definite effusion is seen.  The heart is mildly enlarged and stable.  IMPRESSION: Little change in poor aeration and left basilar opacity most consistent with atelectasis.  Original Report Authenticated By: Juline Patch, M.D.   Recent Results (from the past 24 hour(s))  BASIC METABOLIC PANEL   Collection Time   07/23/11  6:50 AM      Component Value Range   Sodium 139  135 - 145 (mEq/L)   Potassium 4.0  3.5 - 5.1 (mEq/L)   Chloride 101  96 - 112 (mEq/L)   CO2 29  19 - 32 (mEq/L)   Glucose, Bld 110 (*) 70 - 99 (mg/dL)   BUN <3 (*) 6 - 23 (mg/dL)   Creatinine, Ser 4.54  0.50 - 1.10 (mg/dL)   Calcium 9.1  8.4 - 09.8 (mg/dL)   GFR calc non Af Amer 73 (*) >90 (mL/min)   GFR calc Af Amer 84 (*) >90 (mL/min)  CBC   Collection Time   07/23/11  6:50 AM      Component  Value Range   WBC 15.1 (*) 4.0 - 10.5 (K/uL)   RBC 3.28 (*) 3.87 - 5.11 (MIL/uL)   Hemoglobin 9.0 (*) 12.0 - 15.0 (g/dL)   HCT 11.9 (*) 14.7 - 46.0 (%)   MCV 82.3  78.0 - 100.0 (fL)   MCH 27.4  26.0 - 34.0 (pg)   MCHC 33.3  30.0 - 36.0 (g/dL)   RDW 82.9 (*) 56.2 - 15.5 (%)   Platelets 463 (*) 150 - 400 (K/uL)     Assessment: s/p  LAPAROSCOPIC SALPINGO OOPHERECTOMY: improving.  Ileus seems to be resolving.  Still with more respiratory effort than cwould like to see.  CXR with atelectasis.  Plan: Continue triples, involve respiratory therapy to improve volumes and try to improve atelectasis  LOS: 9 days    [name] H 07/23/2011, 3:03 PM

## 2011-07-23 NOTE — Progress Notes (Signed)
Patient has complained of nausea all evening.  She did vomit up 750 mL of dark [place] bile with small pieces of cereal she ate this morning.  She has vomited the last three days and complains of nausea constantly.  I called and spoke with Dr. Emelda Fear, the doctor on call for Dr. Despina Hidden and got orders for NG tube insertion as well as adding D51/2NS with 20 mEq of potassium at 100 mL/hr.  Both will be carried through ASAP.

## 2011-07-24 MED ORDER — MENTHOL 3 MG MT LOZG
1.0000 | LOZENGE | OROMUCOSAL | Status: DC | PRN
  Filled 2011-07-24: qty 9

## 2011-07-24 MED ORDER — HYDROMORPHONE HCL PF 1 MG/ML IJ SOLN
1.0000 mg | INTRAMUSCULAR | Status: DC | PRN
  Administered 2011-07-24: 2 mg via INTRAVENOUS
  Administered 2011-07-25 (×5): 1 mg via INTRAVENOUS
  Administered 2011-07-26: 2 mg via INTRAVENOUS
  Administered 2011-07-26: 1 mg via INTRAVENOUS
  Administered 2011-07-27 – 2011-07-30 (×4): 2 mg via INTRAVENOUS
  Filled 2011-07-24: qty 2
  Filled 2011-07-24: qty 1
  Filled 2011-07-24 (×5): qty 2
  Filled 2011-07-24 (×6): qty 1

## 2011-07-24 MED ORDER — PHENOL 1.4 % MT LIQD
2.0000 | OROMUCOSAL | Status: DC | PRN
  Filled 2011-07-24: qty 177

## 2011-07-24 NOTE — Progress Notes (Signed)
6 Days Post-Op  LAPAROSCOPIC SALPINGO OOPHERECTOMY  Subjective: Patient reports nausea.    Objective:  Filed Vitals:   07/24/11 0231 07/24/11 0505 07/24/11 0700 07/24/11 1355  BP: 114/77 122/81  120/75  Pulse: 79 80  87  Temp: 98.7 F (37.1 C) 98.7 F (37.1 C)  98.4 F (36.9 C)  TempSrc: Oral Oral  Oral  Resp: 18 20  19   Height:      Weight:  272 lb 4.3 oz (123.5 kg)    SpO2: 94% 99% 93% 95%    I have reviewed patient's vital signs, intake and output, medications and labs.  General: alert and cooperative Resp: diminished breath sounds bilaterally Cardio: regular rate and rhythm, S1, S2 normal, no murmur, click, rub or gallop GI: abnormal findings:  diminished bowel sounds and incision: clean, dry and intact   Assessment: s/p  LAPAROSCOPIC SALPINGO OOPHERECTOMY: ileus present  Plan: Encourage ambulation NPO  LOS: 10 days    [name] H 07/24/2011, 7:26 PM

## 2011-07-25 LAB — GENTAMICIN LEVEL, RANDOM: Gentamicin Rm: 5.2 ug/mL

## 2011-07-25 MED ORDER — BISACODYL 10 MG RE SUPP
10.0000 mg | Freq: Three times a day (TID) | RECTAL | Status: DC
  Administered 2011-07-25 – 2011-07-28 (×8): 10 mg via RECTAL
  Filled 2011-07-25 (×8): qty 1

## 2011-07-25 NOTE — Progress Notes (Signed)
7 Days Post-Op  LAPAROSCOPIC SALPINGO OOPHERECTOMY  Subjective: Patient reports no BM.  NG tube is bothering her uncomfortable    Objective: I have reviewed patient's vital signs, intake and output, medications and labs.  Filed Vitals:   07/25/11 0238 07/25/11 0444 07/25/11 0646 07/25/11 1054  BP: 108/71 113/73  124/80  Pulse: 86 78  80  Temp: 97.9 F (36.6 C) 98.6 F (37 C)  98.5 F (36.9 C)  TempSrc: Oral Oral    Resp: 20 18  20   Height:      Weight:   269 lb 13.5 oz (122.4 kg)   SpO2: 91% 91%  90%    General: alert, cooperative, fatigued and mild distress Resp: diminished breath sounds bilaterally Cardio: regular rate and rhythm, S1, S2 normal, no murmur, click, rub or gallop GI: abnormal findings:  hypoactive bowel sounds and obese  Assessment: s/p  LAPAROSCOPIC SALPINGO OOPHERECTOMY: NG tube clamped and will reassess later to see if can be removed  Plan:   LOS: 11 days    EURE,LUTHER H 07/25/2011, 2:22 PM

## 2011-07-26 MED ORDER — AMOXICILLIN-POT CLAVULANATE 875-125 MG PO TABS
1.0000 | ORAL_TABLET | Freq: Two times a day (BID) | ORAL | Status: DC
  Administered 2011-07-26 (×2): 1 via ORAL
  Filled 2011-07-26 (×2): qty 1

## 2011-07-26 MED ORDER — METRONIDAZOLE 500 MG PO TABS
500.0000 mg | ORAL_TABLET | Freq: Three times a day (TID) | ORAL | Status: DC
  Administered 2011-07-26 – 2011-07-27 (×4): 500 mg via ORAL
  Filled 2011-07-26 (×4): qty 1

## 2011-07-26 MED ORDER — PROMETHAZINE HCL 25 MG/ML IJ SOLN
25.0000 mg | Freq: Once | INTRAMUSCULAR | Status: AC
  Administered 2011-07-26: 25 mg via INTRAVENOUS
  Filled 2011-07-26: qty 1

## 2011-07-26 MED ORDER — SODIUM CHLORIDE 0.9 % IJ SOLN
INTRAMUSCULAR | Status: AC
  Administered 2011-07-26: 18:00:00
  Filled 2011-07-26: qty 3

## 2011-07-26 NOTE — Progress Notes (Signed)
ANTIBIOTIC CONSULT NOTE  Pharmacy Consult for Gentamicin Indication: Pelvic abscess  No Known Allergies  Patient Measurements: Height: 5\' 6"  (167.6 cm) Weight: 269 lb 13.5 oz (122.4 kg) IBW/kg (Calculated) : 59.3  Adjusted Body Weight:100 kg Vital Signs: Temp: 98.3 F (36.8 C) (02/12 0528) Temp src: Oral (02/12 0528) BP: 123/75 mmHg (02/12 0528) Pulse Rate: 80  (02/12 0528) Intake/Output from previous day: 02/11 0701 - 02/12 0700 In: 1200 [I.V.:1000; IV Piggyback:200] Out: 1304 [Urine:1004; Emesis/NG output:300] Intake/Output from this shift:    Labs: Random Gent level = 5.2 on 2/10   Ref. Range 07/18/2011 19:29 07/19/2011 01:30 07/19/2011 05:35 07/19/2011 16:41 07/20/2011 06:00  Gentamicin Rm No range found  <0.2  4.5    No results found for this basename: WBC:3,HGB:3,PLT:3,LABCREA:3,CREATININE:3 in the last 72 hours Estimated Creatinine Clearance: 108.3 ml/min (by C-G formula based on Cr of 1.05).   Microbiology: Recent Results (from the past 720 hour(s))  URINE CULTURE     Status: Normal   Collection Time   07/02/11  9:20 PM      Component Value Range Status Comment   Specimen Description URINE, CATHETERIZED   Final    Special Requests NONE   Final    Culture  Setup Time [id number]   Final    Colony Count NO GROWTH   Final    Culture NO GROWTH   Final    Report Status 07/05/2011 FINAL   Final   URINE CULTURE     Status: Normal   Collection Time   07/14/11  2:42 PM      Component Value Range Status Comment   Specimen Description URINE, CATHETERIZED   Final    Special Requests NONE   Final    Culture  Setup Time [id number]   Final    Colony Count NO GROWTH   Final    Culture NO GROWTH   Final    Report Status 07/16/2011 FINAL   Final   WET PREP, GENITAL     Status: Abnormal   Collection Time   07/14/11  2:56 PM      Component Value Range Status Comment   Yeast Wet Prep HPF POC NONE SEEN  NONE SEEN  Final    Trich, Wet Prep NONE SEEN  NONE SEEN  Final    Clue  Cells Wet Prep HPF POC NONE SEEN  NONE SEEN  Final    WBC, Wet Prep HPF POC TOO NUMEROUS TO COUNT (*) NONE SEEN  Final   CULTURE, BLOOD (ROUTINE X 2)     Status: Normal   Collection Time   07/15/11  7:30 PM      Component Value Range Status Comment   Specimen Description BLOOD LEFT ANTECUBITAL   Final    Special Requests BOTTLES DRAWN AEROBIC AND ANAEROBIC 6CC   Final    Culture NO GROWTH 5 DAYS   Final    Report Status 07/20/2011 FINAL   Final   CULTURE, BLOOD (ROUTINE X 2)     Status: Normal   Collection Time   07/15/11  7:40 PM      Component Value Range Status Comment   Specimen Description BLOOD LEFT HAND   Final    Special Requests BOTTLES DRAWN AEROBIC AND ANAEROBIC 6CC   Final    Culture NO GROWTH 5 DAYS   Final    Report Status 07/20/2011 FINAL   Final   SURGICAL PCR SCREEN     Status: Normal   Collection Time  07/18/11 11:19 AM      Component Value Range Status Comment   MRSA, PCR NEGATIVE  NEGATIVE  Final    Staphylococcus aureus NEGATIVE  NEGATIVE  Final    Medical History: Past Medical History  Diagnosis Date  . No pertinent past medical history   . Postpartum pain 06/30/2011    Vaginal delivery, moderate bleeding, tender in abd, esp LLQ   Medications:  Scheduled:     . ampicillin (OMNIPEN) IV  2 g Intravenous Q6H  . antiseptic oral rinse  15 mL Mouth Rinse BID  . bisacodyl  10 mg Rectal TID  . clindamycin (CLEOCIN) IV  900 mg Intravenous Q8H  . gentamicin  6 mg/kg (Adjusted) Intravenous Q24H   Assessment: Afebrile. Renal function parameters are stable. CTX negative. Gent level OK to continue once daily dosing  Goal of Therapy:  Eradicate infection.  Plan:  Gentamicin 528 mg IV every 24 hours. Labs per protocol  Valrie Hart A 07/26/2011,9:24 AM

## 2011-07-27 ENCOUNTER — Inpatient Hospital Stay (HOSPITAL_COMMUNITY): Payer: Medicaid Other

## 2011-07-27 LAB — MAGNESIUM: Magnesium: 2 mg/dL (ref 1.5–2.5)

## 2011-07-27 LAB — CBC
Hemoglobin: 9.9 g/dL — ABNORMAL LOW (ref 12.0–15.0)
Platelets: 548 10*3/uL — ABNORMAL HIGH (ref 150–400)
RBC: 3.64 MIL/uL — ABNORMAL LOW (ref 3.87–5.11)

## 2011-07-27 LAB — DIFFERENTIAL
Basophils Relative: 0 % (ref 0–1)
Lymphs Abs: 1.9 10*3/uL (ref 0.7–4.0)
Monocytes Relative: 11 % (ref 3–12)
Neutro Abs: 10.9 10*3/uL — ABNORMAL HIGH (ref 1.7–7.7)
Neutrophils Relative %: 74 % (ref 43–77)

## 2011-07-27 LAB — BASIC METABOLIC PANEL
GFR calc Af Amer: 80 mL/min — ABNORMAL LOW (ref >90)
GFR calc non Af Amer: 69 mL/min — ABNORMAL LOW (ref >90)
Potassium: 4 mEq/L (ref 3.5–5.1)
Sodium: 136 mEq/L (ref 135–145)

## 2011-07-27 LAB — HEPATIC FUNCTION PANEL
ALT: 14 U/L (ref 0–35)
Albumin: 2.2 g/dL — ABNORMAL LOW (ref 3.5–5.2)
Alkaline Phosphatase: 94 U/L (ref 39–117)
Total Protein: 8 g/dL (ref 6.0–8.3)

## 2011-07-27 LAB — PHOSPHORUS: Phosphorus: 4 mg/dL (ref 2.3–4.6)

## 2011-07-27 MED ORDER — LEVOFLOXACIN IN D5W 750 MG/150ML IV SOLN
750.0000 mg | INTRAVENOUS | Status: DC
  Administered 2011-07-27 – 2011-07-29 (×3): 750 mg via INTRAVENOUS
  Filled 2011-07-27 (×5): qty 150

## 2011-07-27 MED ORDER — METRONIDAZOLE IN NACL 5-0.79 MG/ML-% IV SOLN
500.0000 mg | Freq: Three times a day (TID) | INTRAVENOUS | Status: DC
  Administered 2011-07-27 – 2011-07-29 (×7): 500 mg via INTRAVENOUS
  Filled 2011-07-27 (×14): qty 100

## 2011-07-27 MED ORDER — SODIUM CHLORIDE 0.9 % IV SOLN
125.0000 mg | Freq: Four times a day (QID) | INTRAVENOUS | Status: DC
  Administered 2011-07-27 – 2011-07-30 (×12): 125 mg via INTRAVENOUS
  Filled 2011-07-27 (×21): qty 125

## 2011-07-27 MED ORDER — LEVOFLOXACIN IN D5W 750 MG/150ML IV SOLN
INTRAVENOUS | Status: AC
  Filled 2011-07-27: qty 150

## 2011-07-27 MED ORDER — CIPROFLOXACIN HCL 250 MG PO TABS
500.0000 mg | ORAL_TABLET | Freq: Two times a day (BID) | ORAL | Status: DC
  Administered 2011-07-27: 500 mg via ORAL
  Filled 2011-07-27: qty 2

## 2011-07-27 MED ORDER — PANTOPRAZOLE SODIUM 40 MG IV SOLR
40.0000 mg | Freq: Every day | INTRAVENOUS | Status: DC
Start: 2011-07-27 — End: 2011-07-29
  Administered 2011-07-27 – 2011-07-28 (×2): 40 mg via INTRAVENOUS
  Filled 2011-07-27 (×2): qty 40

## 2011-07-27 MED ORDER — METRONIDAZOLE IN NACL 5-0.79 MG/ML-% IV SOLN
INTRAVENOUS | Status: AC
  Filled 2011-07-27: qty 200

## 2011-07-27 NOTE — Progress Notes (Signed)
ANTIBIOTIC CONSULT NOTE  Pharmacy Consult for Gentamicin Indication: Pelvic abscess  No Known Allergies  Patient Measurements: Height: 5\' 6"  (167.6 cm) Weight: 261 lb 14.5 oz (118.8 kg) IBW/kg (Calculated) : 59.3  Adjusted Body Weight:100 kg Vital Signs: Temp: 99.1 F (37.3 C) (02/13 0451) Temp src: Oral (02/13 0451) BP: 123/79 mmHg (02/13 0451) Pulse Rate: 96  (02/13 0451) Intake/Output from previous day: 02/12 0701 - 02/13 0700 In: 2700 [I.V.:2700] Out: 303 [Urine:303] Intake/Output from this shift:    Labs: Random Gent level = 5.2 on 2/10   Ref. Range 07/18/2011 19:29 07/19/2011 01:30 07/19/2011 05:35 07/19/2011 16:41 07/20/2011 06:00  Gentamicin Rm No range found  <0.2  4.5     Basename 07/27/11 0443  WBC --  HGB --  PLT --  LABCREA --  CREATININE 1.10   Estimated Creatinine Clearance: 101.7 ml/min (by C-G formula based on Cr of 1.1).   Microbiology: Recent Results (from the past 720 hour(s))  URINE CULTURE     Status: Normal   Collection Time   07/02/11  9:20 PM      Component Value Range Status Comment   Specimen Description URINE, CATHETERIZED   Final    Special Requests NONE   Final    Culture  Setup Time [id number]   Final    Colony Count NO GROWTH   Final    Culture NO GROWTH   Final    Report Status 07/05/2011 FINAL   Final   URINE CULTURE     Status: Normal   Collection Time   07/14/11  2:42 PM      Component Value Range Status Comment   Specimen Description URINE, CATHETERIZED   Final    Special Requests NONE   Final    Culture  Setup Time [id number]   Final    Colony Count NO GROWTH   Final    Culture NO GROWTH   Final    Report Status 07/16/2011 FINAL   Final   WET PREP, GENITAL     Status: Abnormal   Collection Time   07/14/11  2:56 PM      Component Value Range Status Comment   Yeast Wet Prep HPF POC NONE SEEN  NONE SEEN  Final    Trich, Wet Prep NONE SEEN  NONE SEEN  Final    Clue Cells Wet Prep HPF POC NONE SEEN  NONE SEEN  Final    WBC,  Wet Prep HPF POC TOO NUMEROUS TO COUNT (*) NONE SEEN  Final   CULTURE, BLOOD (ROUTINE X 2)     Status: Normal   Collection Time   07/15/11  7:30 PM      Component Value Range Status Comment   Specimen Description BLOOD LEFT ANTECUBITAL   Final    Special Requests BOTTLES DRAWN AEROBIC AND ANAEROBIC 6CC   Final    Culture NO GROWTH 5 DAYS   Final    Report Status 07/20/2011 FINAL   Final   CULTURE, BLOOD (ROUTINE X 2)     Status: Normal   Collection Time   07/15/11  7:40 PM      Component Value Range Status Comment   Specimen Description BLOOD LEFT HAND   Final    Special Requests BOTTLES DRAWN AEROBIC AND ANAEROBIC 6CC   Final    Culture NO GROWTH 5 DAYS   Final    Report Status 07/20/2011 FINAL   Final   SURGICAL PCR SCREEN     Status: Normal  Collection Time   07/18/11 11:19 AM      Component Value Range Status Comment   MRSA, PCR NEGATIVE  NEGATIVE  Final    Staphylococcus aureus NEGATIVE  NEGATIVE  Final    Medical History: Past Medical History  Diagnosis Date  . No pertinent past medical history   . Postpartum pain 06/30/2011    Vaginal delivery, moderate bleeding, tender in abd, esp LLQ   Medications:  Scheduled:     . amoxicillin-clavulanate  1 tablet Oral Q12H  . antiseptic oral rinse  15 mL Mouth Rinse BID  . bisacodyl  10 mg Rectal TID  . metroNIDAZOLE  500 mg Oral Q8H  . promethazine  25 mg Intravenous Once  . sodium chloride      . DISCONTD: ampicillin (OMNIPEN) IV  2 g Intravenous Q6H  . DISCONTD: clindamycin (CLEOCIN) IV  900 mg Intravenous Q8H  . DISCONTD: gentamicin  6 mg/kg (Adjusted) Intravenous Q24H   Assessment: Afebrile. Renal function parameters are stable, SCr stable CTX negative. Gent level OK to continue once daily dosing  Goal of Therapy:  Eradicate infection.  Plan:  Gentamicin 528 mg IV every 24 hours. Labs per protocol  Valrie Hart A 07/27/2011,9:15 AM

## 2011-07-28 MED ORDER — NYSTATIN 100000 UNIT/ML MT SUSP
5.0000 mL | Freq: Four times a day (QID) | OROMUCOSAL | Status: DC
  Administered 2011-07-28 – 2011-07-29 (×6): 500000 [IU] via ORAL
  Filled 2011-07-28 (×5): qty 5

## 2011-07-28 NOTE — Progress Notes (Signed)
10 Days Post-Op Procedure(s) (LRB): LAPAROSCOPIC SALPINGO OOPHERECTOMY (Left)  Subjective: Patient reports 2 BMs and gas.  No emesis since yesterday.  Feels much better.  Walking around minimal pain.    Objective: I have reviewed patient's vital signs, intake and output, medications and labs. Filed Vitals:   07/27/11 2314 07/28/11 0637 07/28/11 1100 07/28/11 1421  BP: 112/65 115/72 120/80 118/82  Pulse: 94 81 90 87  Temp: 98.1 F (36.7 C) 98.4 F (36.9 C) 98.2 F (36.8 C) 98.5 F (36.9 C)  TempSrc: Oral Oral Oral Axillary  Resp: 19 18 18 18   Height:      Weight:      SpO2: 90% 93% 94% 98%    General: alert, cooperative and no distress Resp: diminished breath sounds bilaterally Cardio: regular rate and rhythm, S1, S2 normal, no murmur, click, rub or gallop GI: soft, non-tender; bowel sounds normal; no masses,  no organomegaly  Assessment: s/p Procedure(s) (LRB): LAPAROSCOPIC SALPINGO OOPHERECTOMY (Left): stable Patient is better, hopefully ileus is finally resolving.  No evidence of lingering infection issues Plan: Encourage ambulation  LOS: 14 days    Kerrie Timm H 07/28/2011, 4:20 PM

## 2011-07-28 NOTE — Progress Notes (Signed)
CARE MANAGEMENT NOTE 07/28/2011  Patient:  [name], [name]   Account Number:  [id number]  Date Initiated:  07/28/2011  Documentation initiated by:  Anibal Henderson  Subjective/Objective Assessment:   Admitted with lt tubo-ovarian abscess and surgery on the 4th. Pt is from home with family and has a new baby- is 4 weeks post partum     Action/Plan:   Referred to chaplain and social work   Anticipated DC Date:  07/29/2011   Anticipated DC Plan:  HOME/SELF CARE  In-house referral  Clinical Social Worker  Chaplain      DC Planning Services  CM consult      Choice offered to / List presented to:             Status of service:  In process, will continue to follow Medicare Important Message given?   (If response is "NO", the following Medicare IM given date fields will be blank) Date Medicare IM given:   Date Additional Medicare IM given:    Discharge Disposition:    Per UR Regulation:    Comments:  07/28/11 1300 Anibal Henderson RN

## 2011-07-29 MED ORDER — PANTOPRAZOLE SODIUM 40 MG PO TBEC
40.0000 mg | DELAYED_RELEASE_TABLET | Freq: Every day | ORAL | Status: DC
  Administered 2011-07-29 – 2011-07-30 (×2): 40 mg via ORAL
  Filled 2011-07-29 (×2): qty 1

## 2011-07-29 NOTE — Plan of Care (Signed)
Problem: Phase III Progression Outcomes Goal: Activity at appropriate level-compared to baseline Outcome: Completed/Met Date Met:  07/29/11 Pt ambulates in the hall with minimal assistance.

## 2011-07-29 NOTE — Plan of Care (Signed)
Problem: Phase III Progression Outcomes Goal: IV/normal saline lock discontinued Outcome: Progressing Pt will be d/c'd in am

## 2011-07-29 NOTE — Progress Notes (Signed)
11 Days Post-Op Procedure(s) (LRB): LAPAROSCOPIC SALPINGO OOPHERECTOMY (Left)  Subjective: Patient reports tolerating PO, + flatus, + BM and no problems voiding.    Objective: I have reviewed patient's vital signs, intake and output, medications and labs.   Filed Vitals:   07/28/11 1421 07/28/11 1851 07/28/11 2227 07/29/11 0643  BP: 118/82 113/73 101/65 113/73  Pulse: 87 77 73 72  Temp: 98.5 F (36.9 C) 98 F (36.7 C) 98.2 F (36.8 C) 98.2 F (36.8 C)  TempSrc: Axillary Oral Oral Oral  Resp: 18 18 19 18   Height:      Weight:      SpO2: 98% 96% 93% 91%    General: alert, cooperative and no distress Resp: clear to auscultation bilaterally Cardio: regular rate and rhythm, S1, S2 normal, no murmur, click, rub or gallop GI: soft, non-tender; bowel sounds normal; no masses,  no organomegaly  Assessment: s/p Procedure(s) (LRB): LAPAROSCOPIC SALPINGO OOPHERECTOMY (Left): progressing well and tolerating diet  Plan: Advance diet Encourage ambulation anticipate discharge tomorrow  LOS: 15 days    Fara Worthy H 07/29/2011, 9:01 AM

## 2011-07-29 NOTE — Progress Notes (Signed)
Nutrition Follow-up  Diet Order:  DYS Diet 3  Per MD note, patient progressing well and tolerating diet. Patient does not want any nutrition supplements.   Meds: Scheduled Meds:   . antiseptic oral rinse  15 mL Mouth Rinse BID  . erythromycin  125 mg Intravenous Q6H  . levofloxacin (LEVAQUIN) IV  750 mg Intravenous Q24H  . metronidazole  500 mg Intravenous Q8H  . nystatin  5 mL Oral QID  . pantoprazole  40 mg Oral QAC breakfast  . DISCONTD: pantoprazole (PROTONIX) IV  40 mg Intravenous Daily   Continuous Infusions:   . dextrose 5 % and 0.45 % NaCl with KCl 20 mEq/L 50 mL/hr (07/29/11 1211)   PRN Meds:.cyclobenzaprine, diphenhydrAMINE, diphenhydrAMINE, HYDROmorphone (DILAUDID) injection, menthol-cetylpyridinium, naloxone, ondansetron (ZOFRAN) IV, ondansetron, oxyCODONE-acetaminophen, phenol, sodium chloride, zolpidem, DISCONTD: ondansetron (ZOFRAN) IV  Labs:  CMP     Component Value Date/Time   NA 136 07/27/2011 0443   K 4.0 07/27/2011 0443   CL 101 07/27/2011 0443   CO2 25 07/27/2011 0443   GLUCOSE 107* 07/27/2011 0443   BUN 2* 07/27/2011 0443   CREATININE 1.10 07/27/2011 0443   CALCIUM 9.6 07/27/2011 0443   PROT 8.0 07/27/2011 0443   ALBUMIN 2.2* 07/27/2011 0443   AST 20 07/27/2011 0443   ALT 14 07/27/2011 0443   ALKPHOS 94 07/27/2011 0443   BILITOT 0.4 07/27/2011 0443   GFRNONAA 69* 07/27/2011 0443   GFRAA 80* 07/27/2011 0443     Intake/Output Summary (Last 24 hours) at 07/29/11 1648 Last data filed at 07/29/11 0600  Gross per 24 hour  Intake   2330 ml  Output      3 ml  Net   2327 ml    Weight Status:  255 lb.  *Recommend re-weigh patient  Re-estimated needs:  Kcal: 2000-2300    Protein: 110-120 grams    Fluid: >2.0 L   Nutrition Dx:  Inadequate Oral Intake, Ongoing  Monitor/ Goal: PO intake to meet >90% of estimated nutrition needs. Promote weight maintenance  Monitor: PO intake, weight trends, labs,I/O's, diet advancement    Intervention:  Patient does not want  any nutritional supplements or snacks at this time. RD to monitor PO intake and adequacy meeting estimated energy requirements.   Adron Bene Pager #:  [phone]

## 2011-07-29 NOTE — Progress Notes (Signed)
The patient is receiving Protonix by the intravenous route.  Based on criteria approved by the Pharmacy and Therapeutics Committee and the Medical Executive Committee, the medication is being converted to the equivalent oral dose form.  These criteria include: -No Active GI bleeding -Able to tolerate diet of full liquids (or better) or tube feeding -Able to tolerate other medications by the oral or enteral route  If you have any questions about this conversion, please contact the Pharmacy Department (ext [phone]).  Thank you.  S. Jamaris Theard, PharmD  

## 2011-07-30 MED ORDER — NYSTATIN 100000 UNIT/ML MT SUSP: 5.0000 mL | Freq: Four times a day (QID) | OROMUCOSAL | Status: AC

## 2011-07-30 MED ORDER — OXYCODONE-ACETAMINOPHEN 5-325 MG PO TABS: 1.0000 | ORAL_TABLET | Freq: Four times a day (QID) | ORAL | Status: AC | PRN

## 2011-07-30 MED ORDER — NAPROXEN SODIUM 550 MG PO TABS: 550.0000 mg | ORAL_TABLET | Freq: Two times a day (BID) | ORAL | Status: AC

## 2011-07-30 NOTE — Discharge Instructions (Signed)
Diagnostic Laparoscopy Laparoscopy is a surgical procedure. It is used to diagnose and treat diseases inside the belly(abdomen). It is usually a brief, common, and relatively simple procedure. The laparoscopeis a thin, lighted, pencil-sized instrument. It is like a telescope. It is inserted into your abdomen through a small cut (incision). Your caregiver can look at the organs inside your body through this instrument. He or she can see if there is anything abnormal. Laparoscopy can be done either in a hospital or outpatient clinic. You may be given a mild sedative to help you relax before the procedure. Once in the operating room, you will be given a drug to make you sleep (general anesthesia). Laparoscopy usually lasts less than 1 hour. After the procedure, you will be monitored in a recovery area until you are stable and doing well. Once you are home, it will take 2 to 3 days to fully recover. RISKS AND COMPLICATIONS  Laparoscopy has relatively few risks. Your caregiver will discuss the risks with you before the procedure. Some problems that can occur include:  Infection.   Bleeding.   Damage to other organs.   Anesthetic side effects.  PROCEDURE Once you receive anesthesia, your surgeon inflates the abdomen with a harmless gas (carbon dioxide). This makes the organs easier to see. The laparoscope is inserted into the abdomen through a small incision. This allows your surgeon to see into the abdomen. Other small instruments are also inserted into the abdomen through other small openings. Many surgeons attach a video camera to the laparoscope to enlarge the view. During a diagnostic laparoscopy, the surgeon may be looking for inflammation, infection, or cancer. Your surgeon may take tissue samples(biopsies). The samples are sent to a specialist in looking at cells and tissue samples (pathologist). The pathologist examines them under a microscope. Biopsies can help to diagnose or confirm a  disease. AFTER THE PROCEDURE   The gas is released from inside the abdomen.   The incisions are closed with stitches (sutures). Because these incisions are small (usually less than 1/2 inch), there is usually minimal discomfort after the procedure. There may be some mild discomfort in the throat. This is from the tube placed in the throat while you were sleeping. You may have some mild abdominal discomfort. There may also be discomfort from the instrument placement incisions in the abdomen.   The recovery time is shortened as long as there are no complications.   You will rest in a recovery room until stable and doing well. As long as there are no complications, you may be allowed to go home.  FINDING OUT THE RESULTS OF YOUR TEST Not all test results are available during your visit. If your test results are not back during the visit, make an appointment with your caregiver to find out the results. Do not assume everything is normal if you have not heard from your caregiver or the medical facility. It is important for you to follow up on all of your test results. HOME CARE INSTRUCTIONS   Take all medicines as directed.   Only take over-the-counter or prescription medicines for pain, discomfort, or fever as directed by your caregiver.   Resume daily activities as directed.   Showers are preferred over baths.   You may resume sexual activities in 1 week or as directed.   Do not drive while taking narcotics.  SEEK MEDICAL CARE IF:   There is increasing abdominal pain.   There is new pain in the shoulders (shoulder strap areas).     You feel lightheaded or faint.   You have the chills.   You or your child has an oral temperature above 102 F (38.9 C).   There is pus-like (purulent) drainage from any of the wounds.   You are unable to pass gas or have a bowel movement.   You feel sick to your stomach (nauseous) or throw up (vomit).  MAKE SURE YOU:   Understand these instructions.    Will watch your condition.   Will get help right away if you are not doing well or get worse.  Document Released: 09/05/2000 Document Revised: 02/09/2011 Document Reviewed: 05/30/2007 [facility] Patient Information 2012 [place], [place].Ileus The intestine (bowel, or gut) is a long muscular tube connecting your stomach to your rectum. If the intestine stops working, food cannot pass through. This is called an ileus. This can happen for a variety of reasons. Ileus is a major medical problem that usually requires hospitalization. If your intestine stops working because of a blockage, this is called a bowel obstruction, and is a different condition. CAUSES   Surgery in your abdomen. This can last from a few hours to a few days.   An infection or inflammation in the belly (abdomen). This includes inflammation of the lining of the abdomen (peritonitis).   Infection or inflammation in other parts of the body, such as pneumonia or pancreatitis.   Passage of gallstones or kidney stones.   Damage to the nerves or blood vessels which go to the bowel.   Imbalance in the salts in the blood (electrolytes).   Injury to the brain and or spinal cord.   Medications. Many medications can cause ileus or make it worse. The most common of these are strong pain medications.  SYMPTOMS  Symptoms of bowel obstruction come from the bowel inactivity. They may include:  Bloating. Your belly gets bigger (distension).   Pain or discomfort in the abdomen.   Poor appetite, feeling sick to your stomach (nausea) and vomiting.   You may also not be able to hear your normal bowel sounds, such as "growling" in your stomach.  DIAGNOSIS   Your history and a physical exam will usually suggest to your caregiver that you have an ileus.   X-rays or a CT scan of your abdomen will confirm the diagnosis. X-rays, CT scans and lab tests may also suggest the cause.  TREATMENT   Rest the intestine until it starts working  again. This is most often accomplished by:   Stopping intake of oral food and drink. Dehydration is prevented by using IV (intravenous) fluids.   Sometimes, a naso-gastric tube (NG tube) is needed. This is a narrow plastic tube inserted through your nose and into your stomach. It is connected to suction to keep the stomach emptied out. This also helps treat the nausea and vomiting.   If there is an imbalance in the electrolytes, they are corrected with supplements in your intravenous fluids.   Medications that might make an ileus worse might be stopped.   There are no medications that reliably treat ileus, though your caregiver may suggest a trial of certain medications.   If your condition is slow to resolve, you will be re-evaluated to be sure another condition, such as a blockage, is not present.  Ileus is common and usually has a good outcome. Depending on cause of your ileus, it usually can be treated by your caregivers with good results. Sometimes, specialists (surgeons or gastroenterologists) are asked to assist in your care.  HOME CARE INSTRUCTIONS   Follow your caregiver's instructions regarding diet and fluid intake. This will usually include drinking plenty of clear fluids, avoiding alcohol and caffeine, and eating a gentle diet.   Follow your caregiver's instructions regarding activity. A period of rest is sometimes advised before returning to work or school.   Take only medications prescribed by your caregiver. Be especially careful with narcotic pain medication, which can slow your bowel activity and contribute to ileus.   Keep any follow-up appointments with your caregiver or specialists.  SEEK MEDICAL CARE IF:   You have a recurrence of nausea, vomiting or abdominal discomfort.   You develop fever of more than 102 F (38.9 C).  SEEK IMMEDIATE MEDICAL CARE IF:   You have severe abdominal pain.   You are unable to keep fluids down.  Document Released: 06/02/2003  Document Revised: 02/09/2011 Document Reviewed: 10/02/2008 [facility] Patient Information 2012 [place], [place].

## 2011-07-30 NOTE — Progress Notes (Signed)
Pt discharged with instructions, prescriptions, and carenotes.  She verbalized understanding.  The patient left the floor ambulating with staff in stable condition.

## 2011-08-01 NOTE — Discharge Summary (Signed)
Physician Discharge Summary  Patient ID: [name] MRN: [id number] DOB/AGE: 11-08-1985 25 y.o.  Admit date: 07/14/2011 Discharge date: 08/01/2011  Admission Diagnoses: Pelvic Tubo ovarian process, possible septic pelvic thrombophlebitis  Discharge Diagnoses:  Left Pelvic abscess Post operative ileus Anemia  Discharged Condition: good  Hospital Course: see progress notes  Consults: None  Significant Diagnostic Studies:   Treatments: surgery: laparoscopic left salpingo oophorectomy  Discharge Exam: Blood pressure 111/65, pulse 69, temperature 98.1 F (36.7 C), temperature source Oral, resp. rate 20, height 5\' 6"  (1.676 m), weight 255 lb 8.2 oz (115.9 kg), last menstrual period 06/30/2011, SpO2 96.00%, currently breastfeeding. General appearance: alert, cooperative and no distress Back: negative, symmetric, no curvature. ROM normal. No CVA tenderness. Resp: clear to auscultation bilaterally Cardio: regular rate and rhythm, S1, S2 normal, no murmur, click, rub or gallop GI: soft, non-tender; bowel sounds normal; no masses,  no organomegaly  Disposition: Home or Self Care  Discharge Orders    Future Orders Please Complete By Expires   Diet - low sodium heart healthy      Increase activity slowly      Discharge instructions      Comments:   No sex for 2 weeks   Call MD for:  temperature >100.4      Call MD for:  persistant nausea and vomiting      Call MD for:  severe uncontrolled pain        Medication List  As of 08/01/2011  9:50 AM   STOP taking these medications         acetaminophen 500 MG tablet      ciprofloxacin 500 MG tablet      prenatal multivitamin Tabs         TAKE these medications         naproxen sodium 550 MG tablet   Commonly known as: ANAPROX   Take 1 tablet (550 mg total) by mouth 2 (two) times daily with a meal.      nystatin 100000 UNIT/ML suspension   Commonly known as: MYCOSTATIN   Take 5 mLs (500,000 Units total) by mouth 4 (four)  times daily.      oxyCODONE-acetaminophen 5-325 MG per tablet   Commonly known as: PERCOCET   Take 1-2 tablets by mouth every 6 (six) hours as needed (moderate to severe pain (when tolerating fluids)).           Follow-up Information    Follow up with Lazaro Arms, MD on 08/02/2011. (Call the office to make appointment)          Signed: Lazaro Arms 08/01/2011, 9:50 AM

## 2011-08-11 ENCOUNTER — Other Ambulatory Visit: Payer: Self-pay | Admitting: Obstetrics & Gynecology

## 2011-08-11 DIAGNOSIS — K829 Disease of gallbladder, unspecified: Secondary | ICD-10-CM

## 2011-08-16 ENCOUNTER — Ambulatory Visit (HOSPITAL_COMMUNITY)
Admission: RE | Admit: 2011-08-16 | Discharge: 2011-08-16 | Disposition: A | Discharge status: Home / Self Care | Payer: Medicaid Other | Source: Ambulatory Visit | Attending: Obstetrics & Gynecology | Admitting: Obstetrics & Gynecology

## 2011-08-16 DIAGNOSIS — K829 Disease of gallbladder, unspecified: Secondary | ICD-10-CM

## 2011-08-16 DIAGNOSIS — R109 Unspecified abdominal pain: Secondary | ICD-10-CM | POA: Insufficient documentation

## 2011-08-16 DIAGNOSIS — K838 Other specified diseases of biliary tract: Secondary | ICD-10-CM | POA: Insufficient documentation

## 2012-06-02 IMAGING — US US TRANSVAGINAL NON-OB
1 series · 13 of 25 positions shown · non-contrast
Comparison: 11/10/2010

CLINICAL DATA: 2 weeks post delivery with pelvic pain

TRANSABDOMINAL AND TRANSVAGINAL ULTRASOUND OF PELVIS
DOPPLER ULTRASOUND OF OVARIES
TECHNIQUE: Both transabdominal and transvaginal ultrasound
examinations of the pelvis were performed. Transabdominal technique
was performed for global imaging of the pelvis including uterus,
ovaries, adnexal regions, and pelvic cul-de-sac.
It was necessary to proceed with endovaginal exam following the
transabdominal exam to visualize the adnexa.
Color and duplex Doppler ultrasound was utilized to evaluate blood
flow to the ovaries.

[Series 1: us transvaginal non-ob · 0.30mm/px · 13 of 95 slices shown]
[im 1/95]
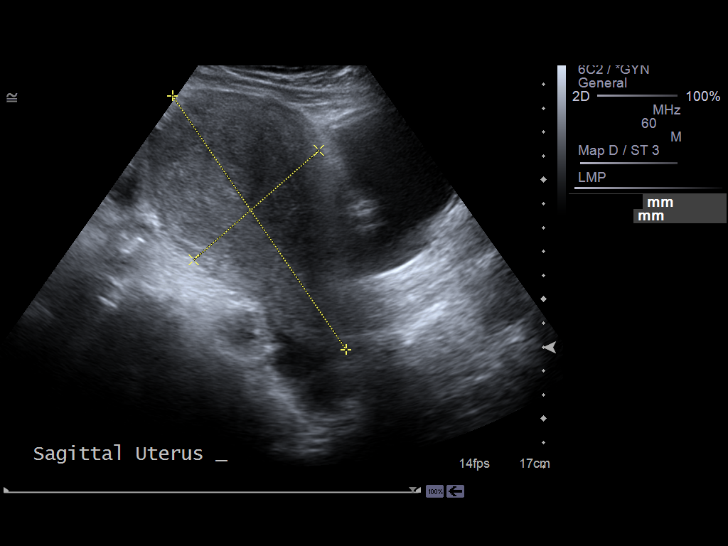
[im 8/95]
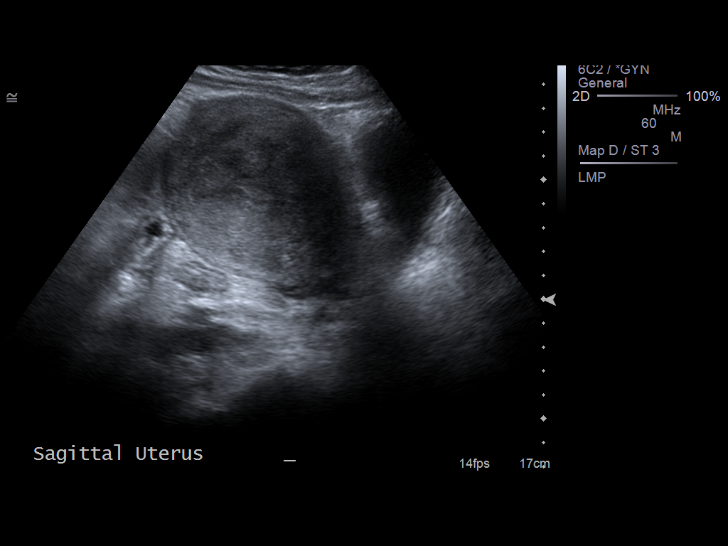
[im 16/95]
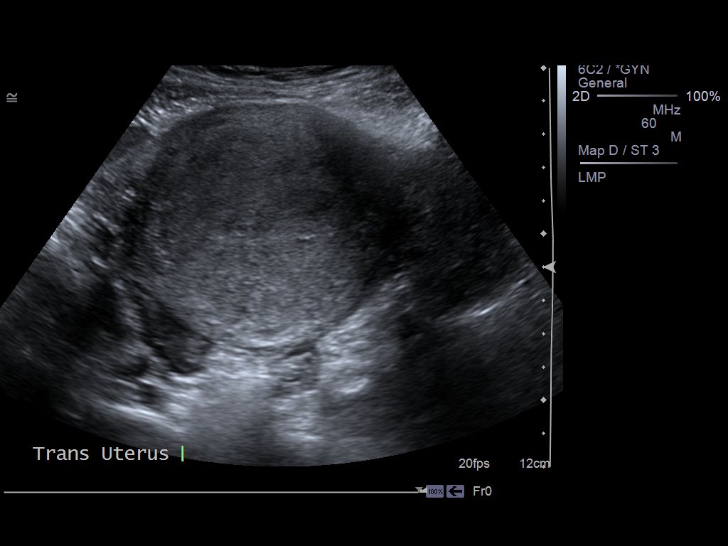
[im 24/95]
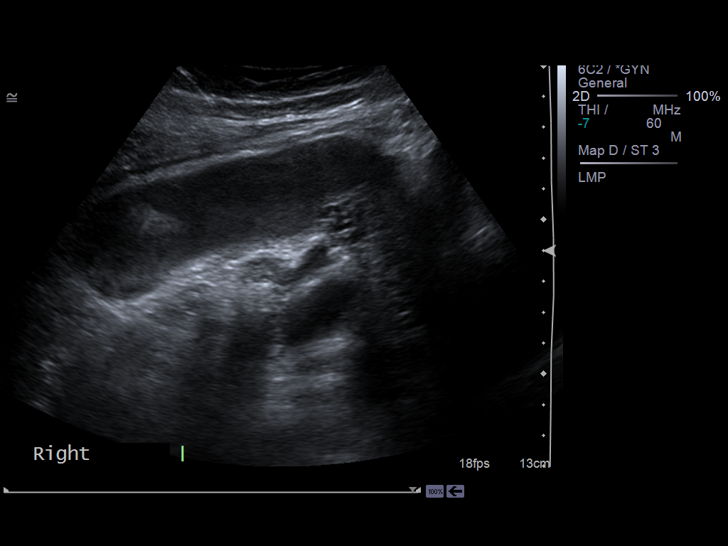
[im 32/95]
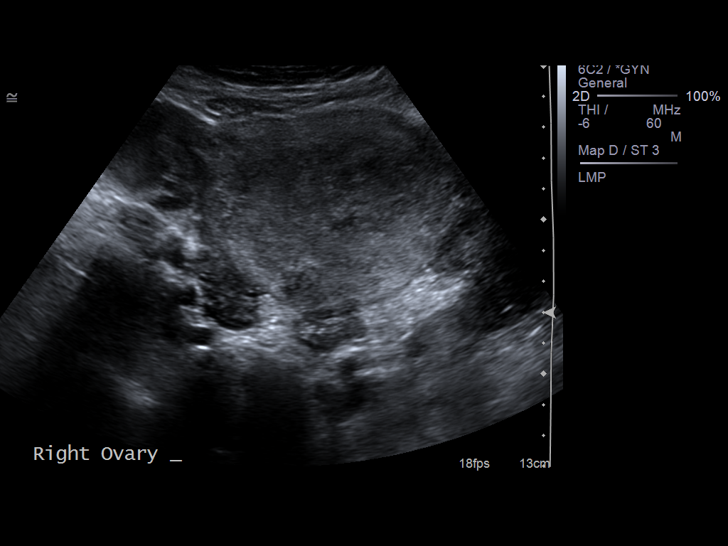
[im 40/95]
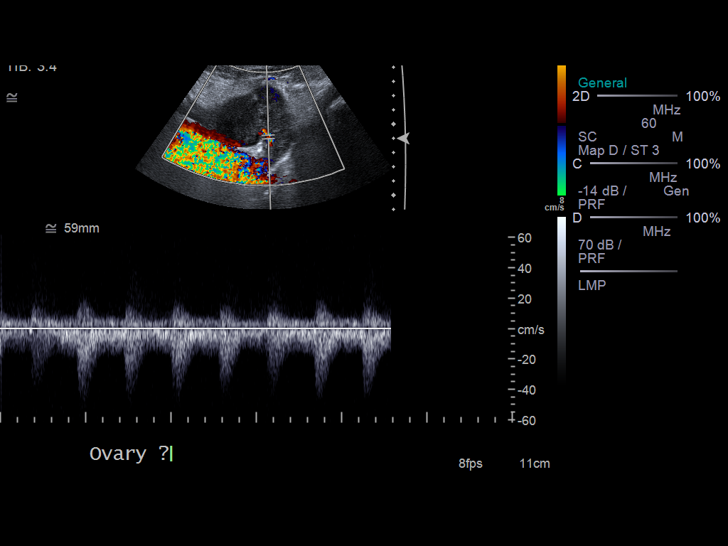
[im 48/95]
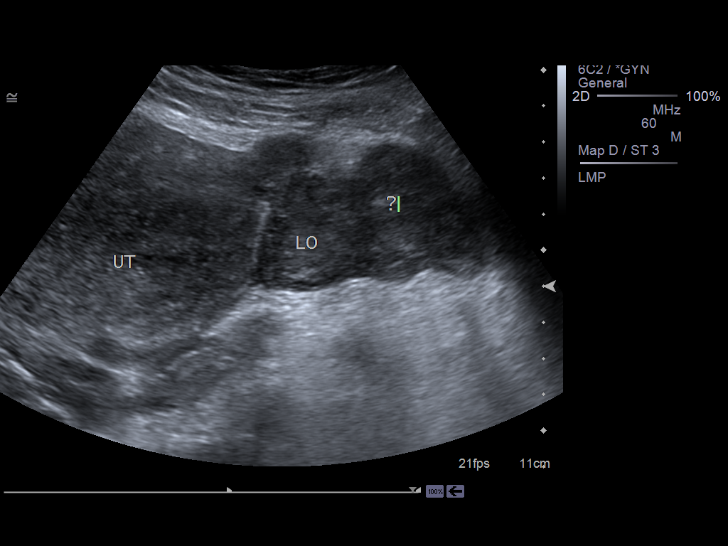
[im 55/95]
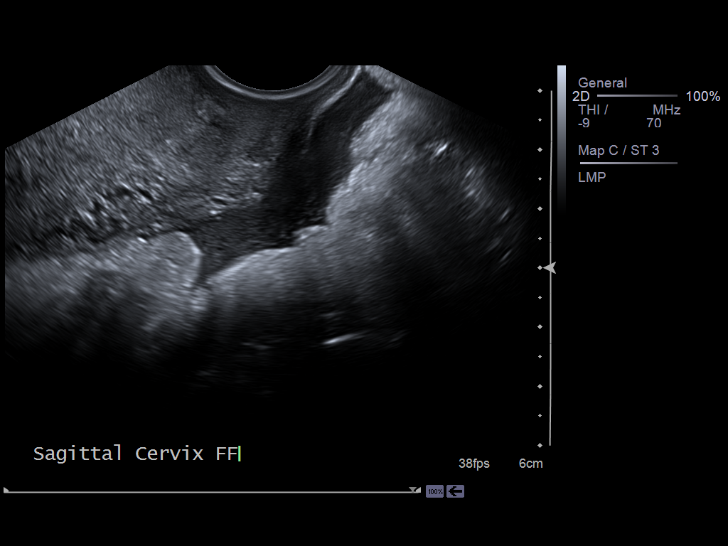
[im 63/95]
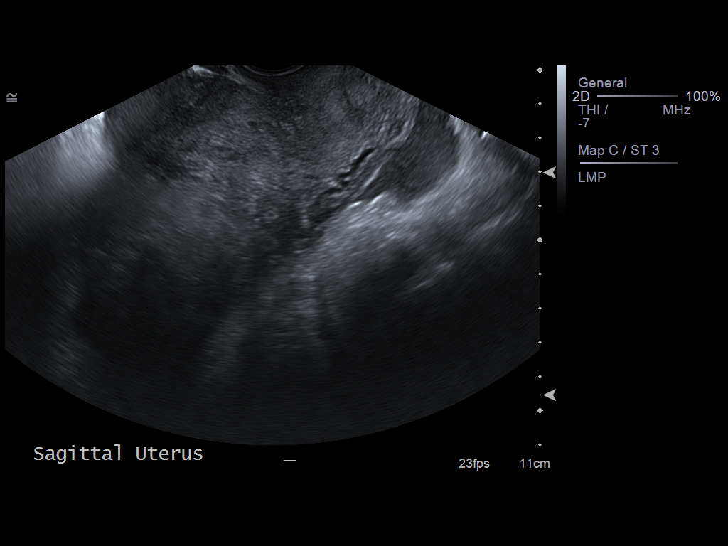
[im 71/95]
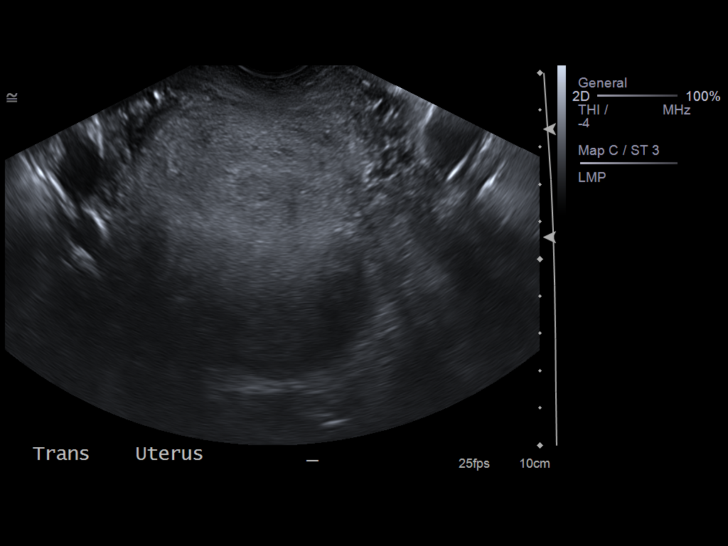
[im 79/95]
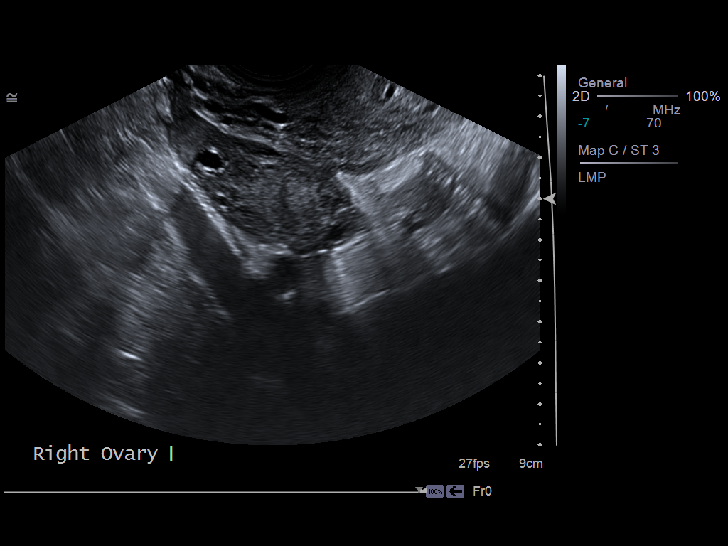
[im 87/95]
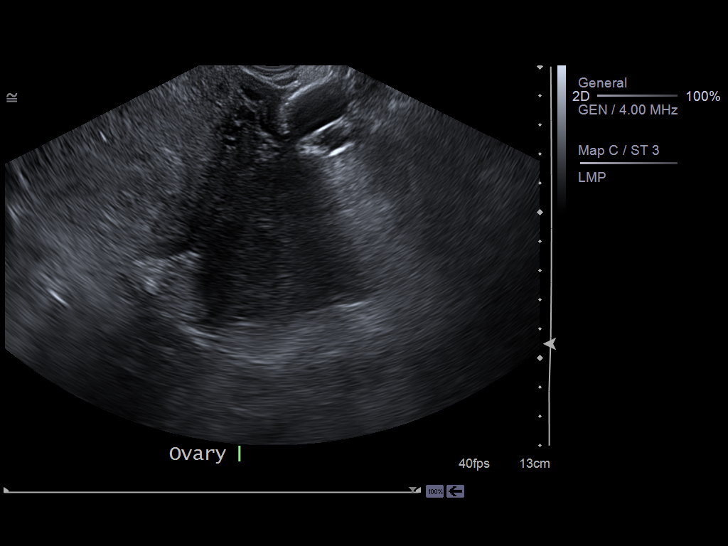
[im 95/95]
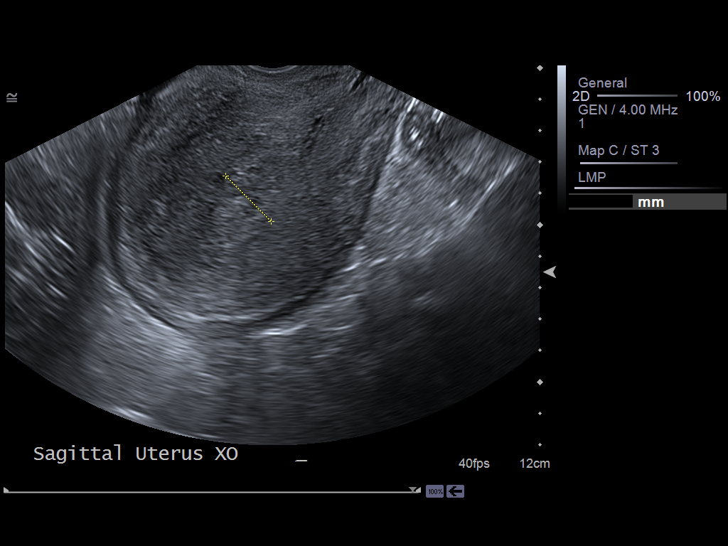

[13 of 25 positions shown; findings below may reference images not displayed]

FINDINGS: Uterus:  Is enlarged with a sagittal length of 12.9 cm, depth of
7.0 cm and width of 8.4 cm.  Size is compatible with the patient's
postpartum status.  No focal abnormalities are seen

Endometrium:  Is poorly defined and difficult to measure.  No
abnormal flow is identified.  I suspect the appearance may be the
result of some residual blood products within the endometrial canal
this soon postpartum.

Right ovary: Has a normal appearance measuring 2.4 x 1.4 x 1.9 cm.

Left ovary:   A normal appearing left ovary is not visualized.  In
the left adnexa there is a lobulated soft tissue mass identified
which measures 5.3 x 6.3 x 6.4 cm.  This contains a small amount of
flow peripherally but no central flow. This is felt unlikely to
represent a subserosal or broad ligament fibroid as no fibroid was
present on early obstetrical ultrasound performed in 4084.

A moderate amount of complex fluid is identified in the cul-de-sac,
upper pelvis and right adnexal region..

Pulsed Doppler evaluation demonstrates normal low-resistance
arterial and venous waveforms in the right ovary..
IMPRESSION: Lobulated soft tissue mass with peripheral vascularity seen in the
left adnexa with associated complex pelvic fluid.  The finding is
suspicious in the appropriate clinical setting for ovarian torsion
with associated hemoperitoneum.

Normal postpartum appearance to the uterus.

Normal right ovary

This report was personally called and discussed with the treating
ER physician at [DATE] on 07/14/2011.

## 2012-06-15 IMAGING — CR DG ABDOMEN 2V
3 series · 3 of 3 positions shown · non-contrast
Comparison: No priors.

CLINICAL DATA: Recent childbirth.  Persistent to postoperative
ileus.

ABDOMEN - 2 VIEW

[view not recorded (1 of 3)]
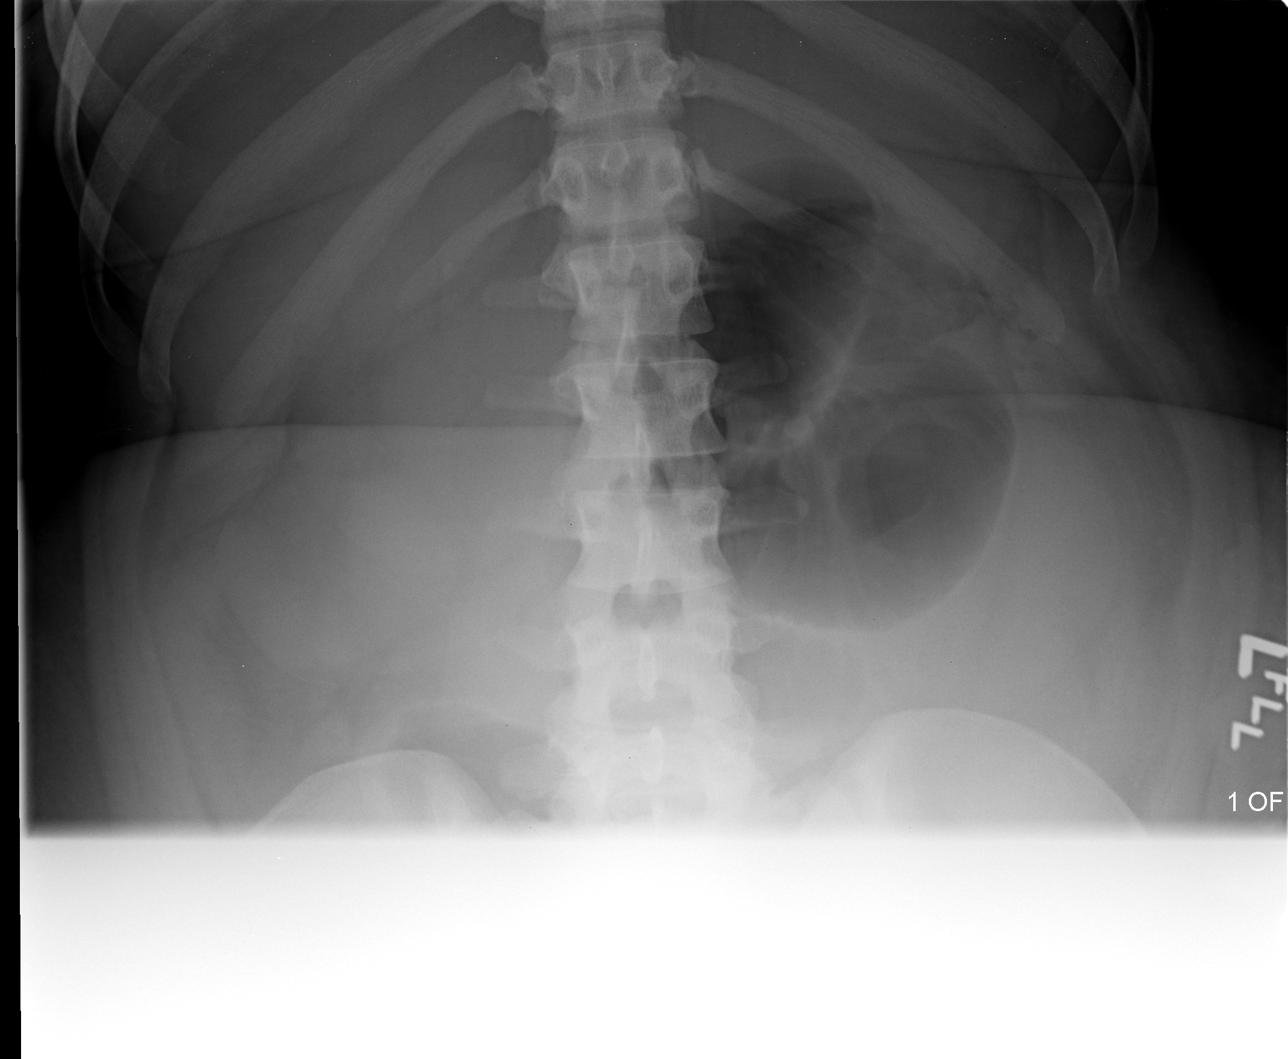

[view not recorded (2 of 3)]
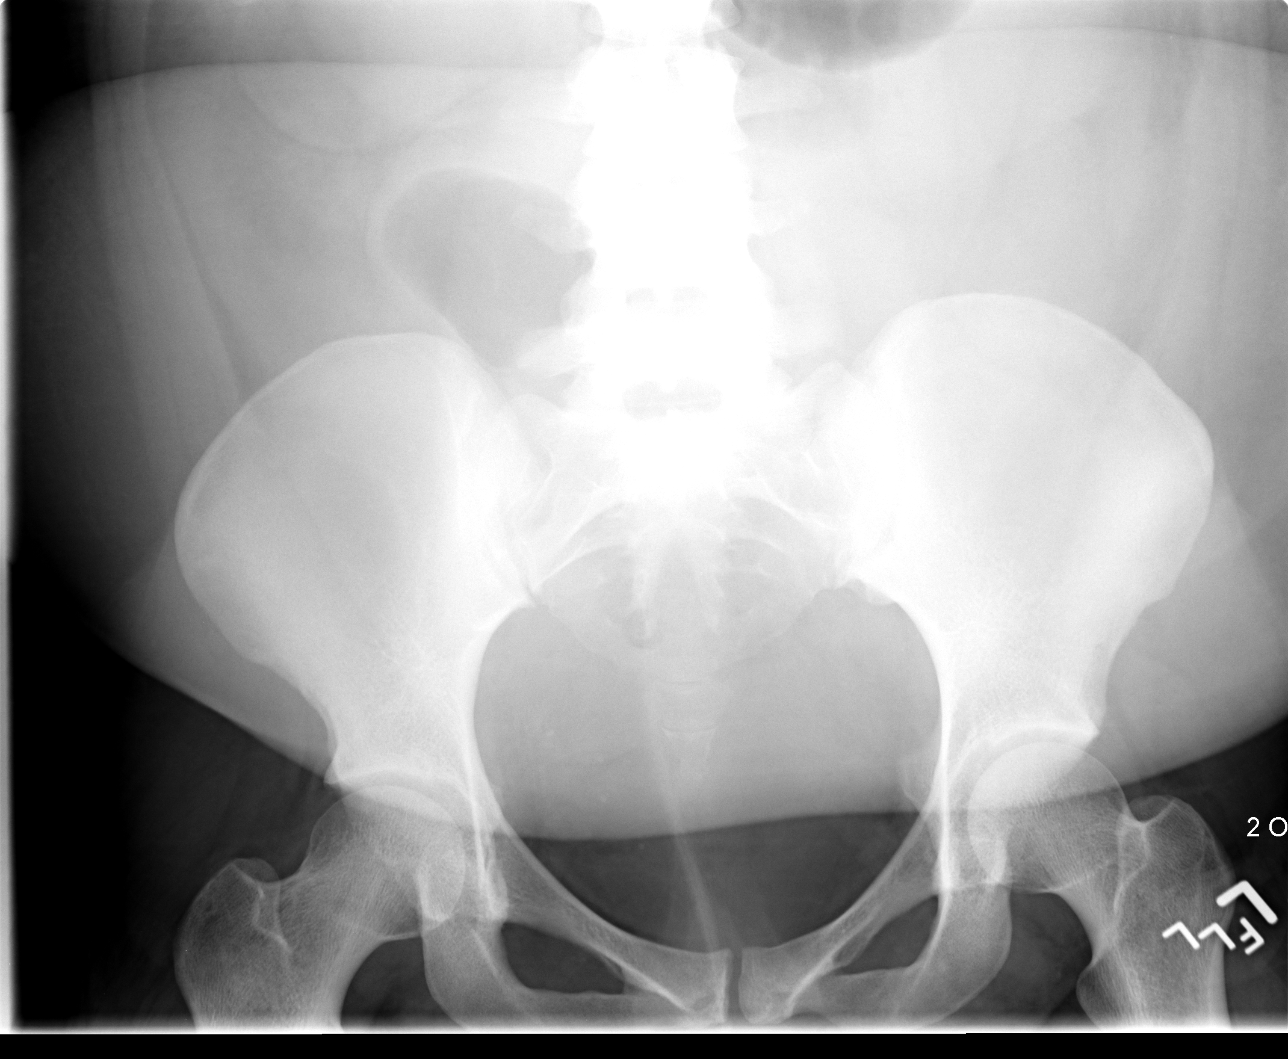

[view not recorded (3 of 3)]
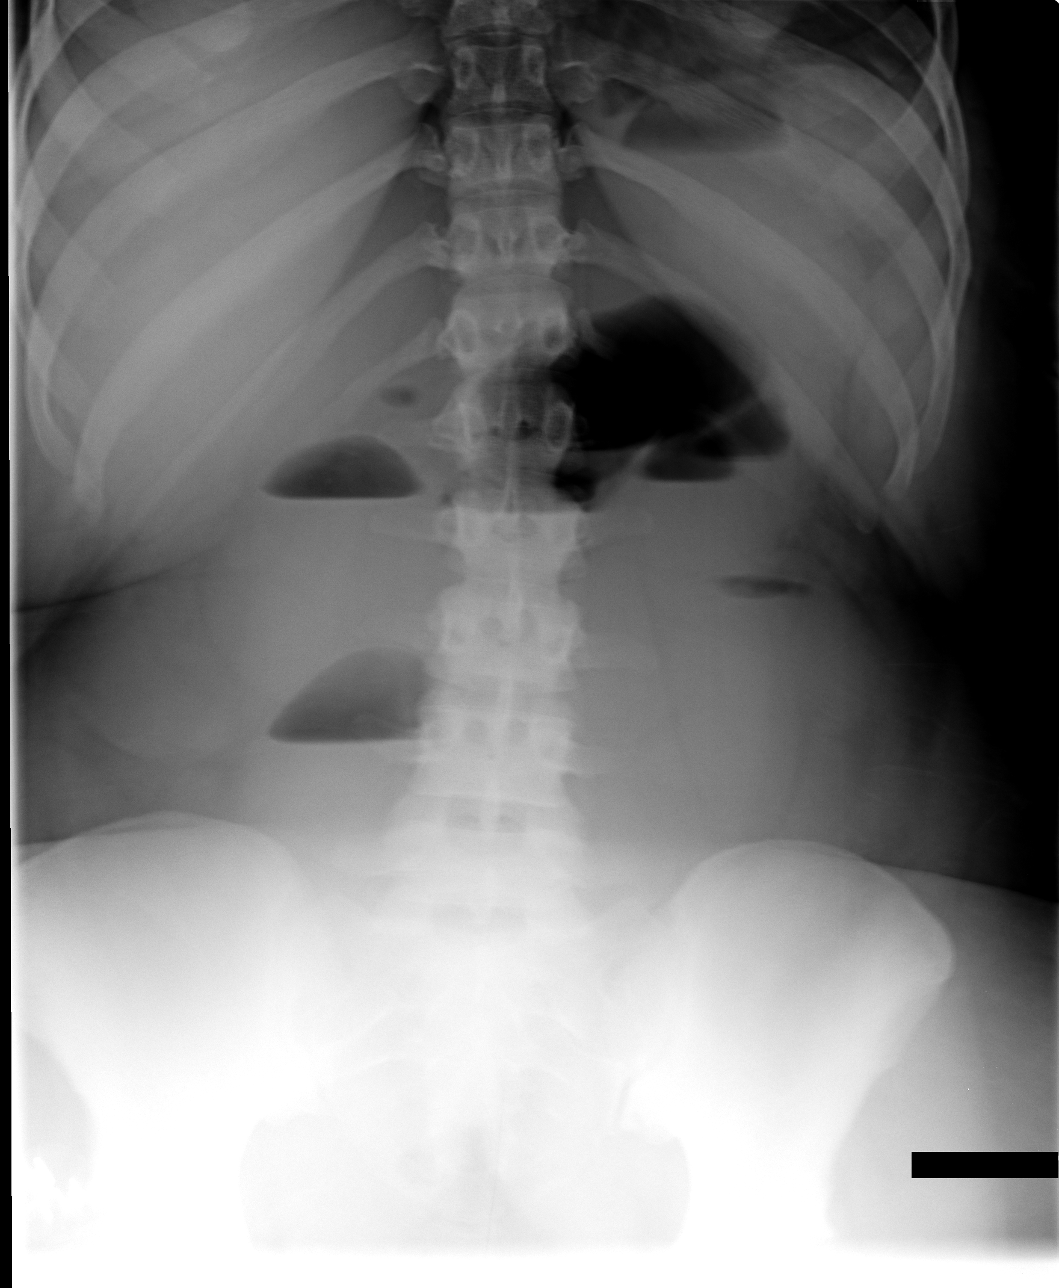

[3 of 3 positions shown; findings below may reference images not displayed]

FINDINGS: Supine and upright views of the abdomen demonstrate a
paucity of bowel gas, and very little colonic or rectal gas or
stool.  In the central abdomen there are dilated loops of gas-
filled small bowel which measure up to 7.7 cm in diameter.  The
upright projection demonstrates multiple air fluid levels. No gross
evidence of pneumoperitoneum at this time.
IMPRESSION: 1.  The bowel gas pattern, as above, is most concerning for
potential small bowel obstruction.  Conceivably, these findings
could also be present in the setting of ileus; clinical correlation
is recommended.

with Dr. Kamieka by Dr. Phaelma.

## 2012-07-05 IMAGING — US US ABDOMEN COMPLETE
1 series · 14 of 25 positions shown · non-contrast
Comparison: None.

CLINICAL DATA: Abdominal pain.

COMPLETE ABDOMINAL ULTRASOUND

[Series 1: us abdomen complete · 0.28mm/px · 14 of 101 slices shown]
[im 1/101]
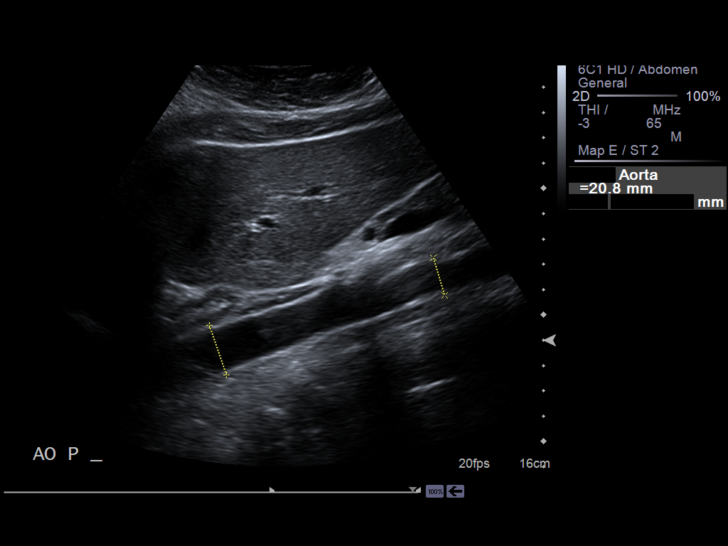
[im 9/101]
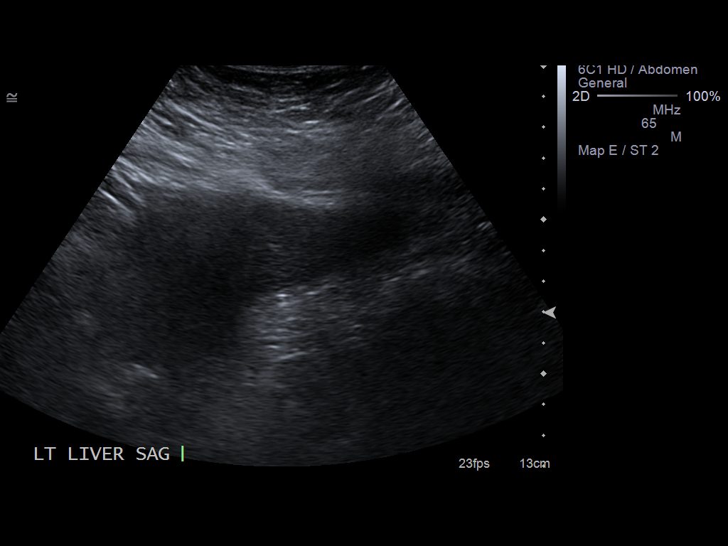
[im 17/101]
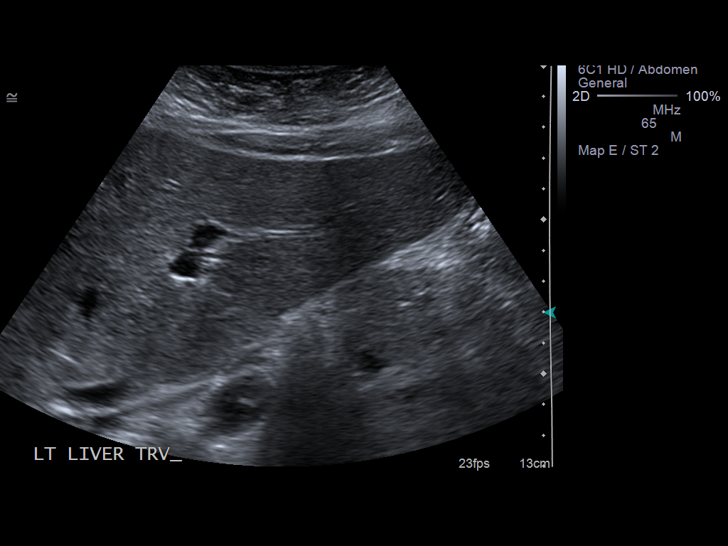
[im 26/101]
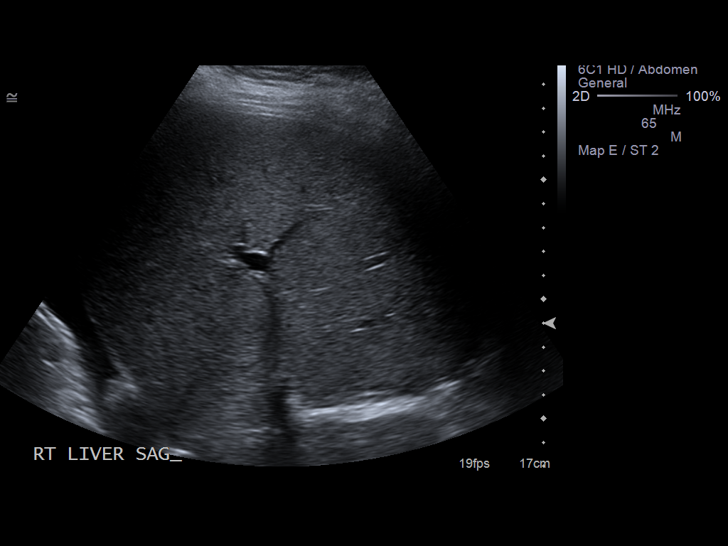
[im 34/101]
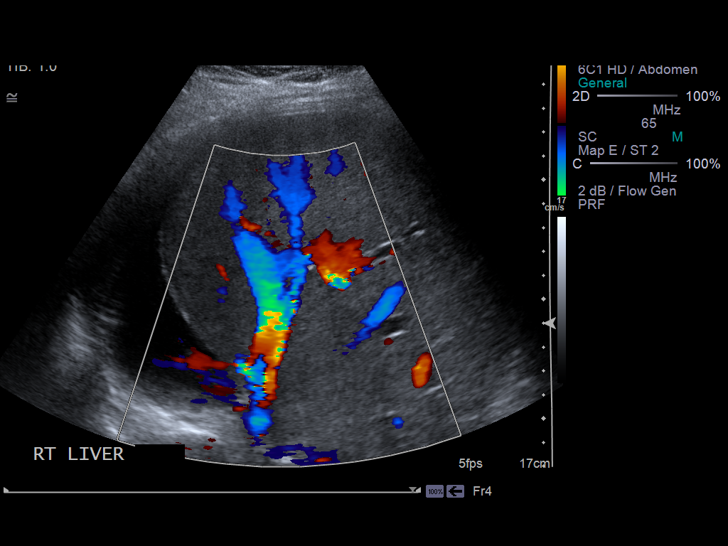
[im 38/101]
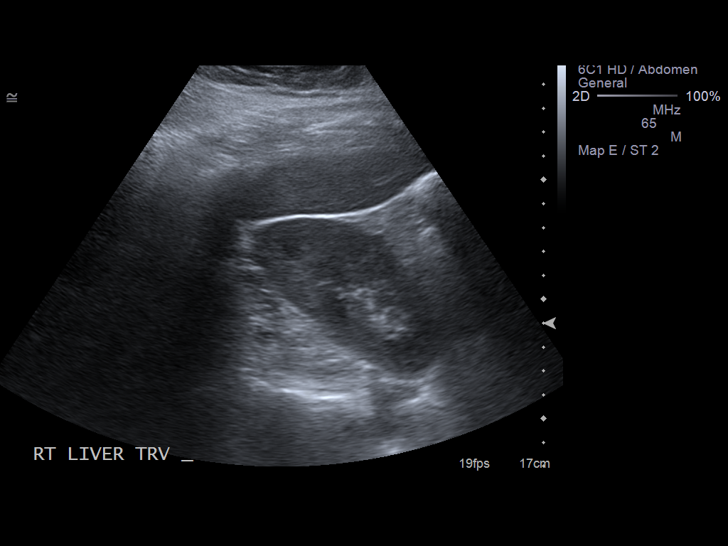
[im 46/101]
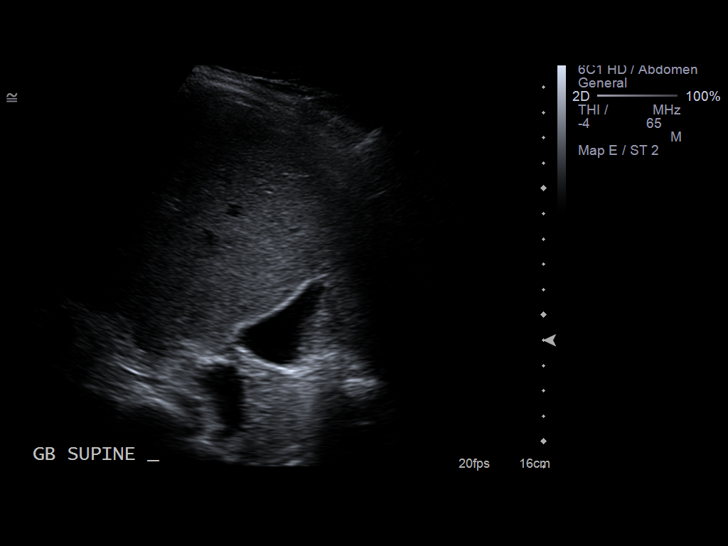
[im 55/101]
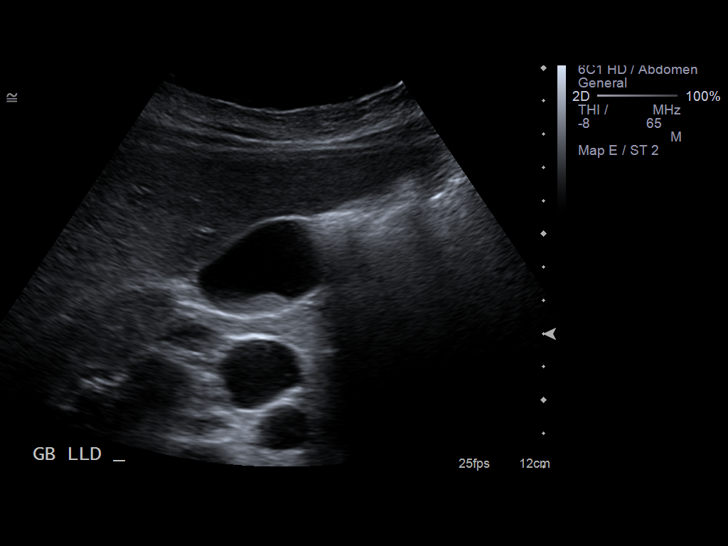
[im 63/101]
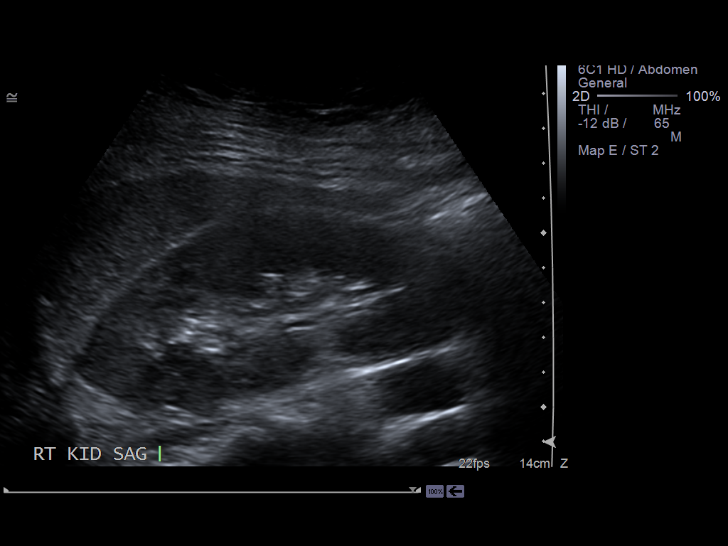
[im 67/101]
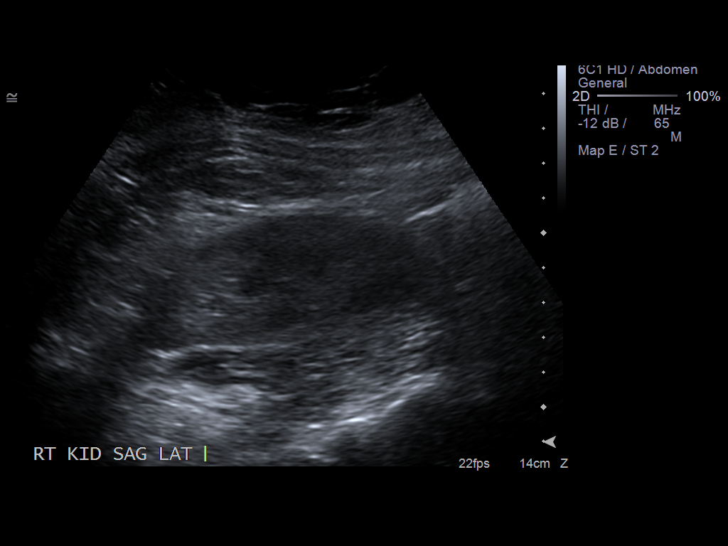
[im 76/101]
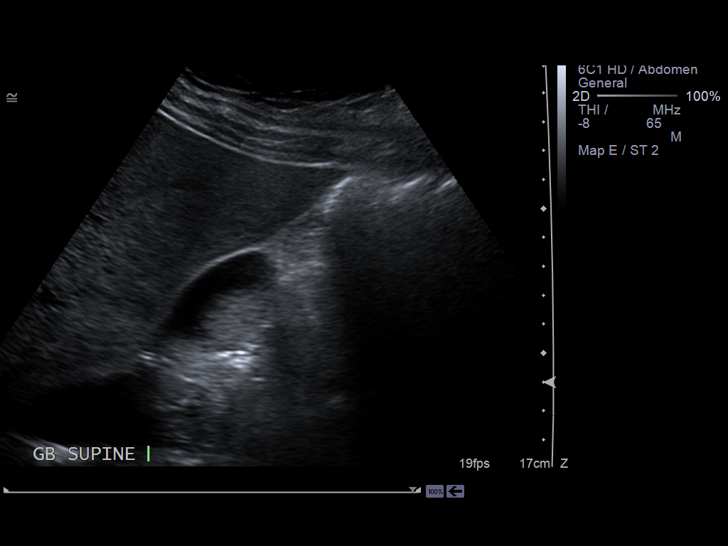
[im 84/101]
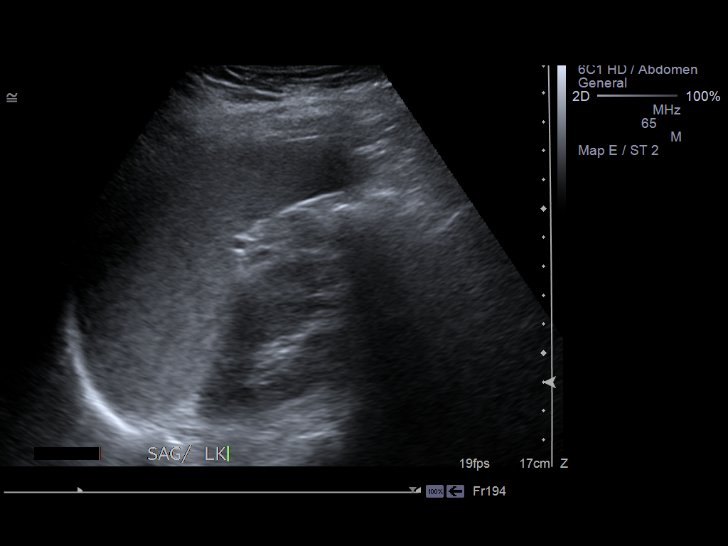
[im 92/101]
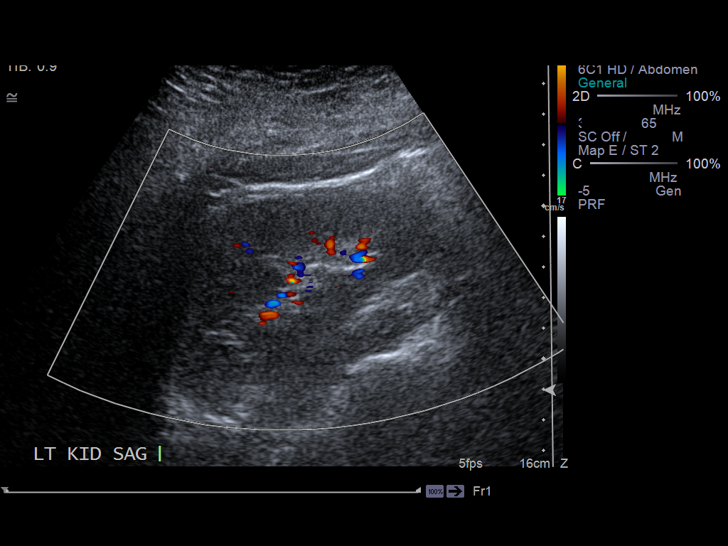
[im 101/101]
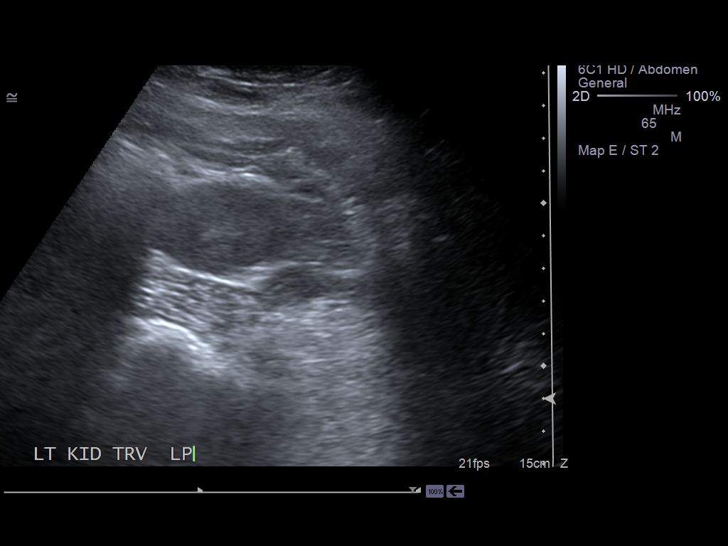

[14 of 25 positions shown; findings below may reference images not displayed]

FINDINGS: Gallbladder:  A small amount of sludge is seen within the
gallbladder.  No stones or wall thickening.

Common bile duct:   Within normal limits in caliber.

Liver:  No focal lesion identified.  Within normal limits in
parenchymal echogenicity.

IVC:  Appears normal.

Pancreas:  No focal abnormality seen.

Spleen:  Within normal limits in size and echotexture.

Right Kidney:   Normal in size and parenchymal echogenicity.  No
evidence of mass or hydronephrosis.

Left Kidney:  Normal in size and parenchymal echogenicity.  No
evidence of mass or hydronephrosis.

Abdominal aorta:  No aneurysm identified.

Right pleural effusion noted.
IMPRESSION: Small amount of sludge within the gallbladder.  No stones, wall
thickening or sonographic Murphy's sign.

Right pleural effusion.

## 2014-04-14 ENCOUNTER — Encounter (HOSPITAL_COMMUNITY): Payer: Self-pay | Admitting: Anesthesiology

## 2021-12-20 ENCOUNTER — Ambulatory Visit
Admission: EM | Admit: 2021-12-20 | Discharge: 2021-12-20 | Disposition: A | Discharge status: Home / Self Care | Payer: PRIVATE HEALTH INSURANCE | Attending: Nurse Practitioner | Admitting: Nurse Practitioner

## 2021-12-20 DIAGNOSIS — L03031 Cellulitis of right toe: Secondary | ICD-10-CM

## 2021-12-20 DIAGNOSIS — R030 Elevated blood-pressure reading, without diagnosis of hypertension: Secondary | ICD-10-CM | POA: Diagnosis not present

## 2021-12-20 MED ORDER — SULFAMETHOXAZOLE-TRIMETHOPRIM 800-160 MG PO TABS: 1.0000 | ORAL_TABLET | Freq: Two times a day (BID) | ORAL | 0 refills | Status: AC

## 2021-12-20 NOTE — ED Triage Notes (Signed)
Pt presents with possible abscess on right great toe, developed yesterday

## 2021-12-20 NOTE — ED Provider Notes (Signed)
[facility]    CSN: [id number] Arrival date & time: 12/20/21  1528      History   Chief Complaint Chief Complaint  Patient presents with   Toe Pain    HPI [name] is a 36 y.o. female.   Patient presents with a few days of right great toe pain, swelling, and drainage for the past couple of days.  Denies any recent accident, injury, or trauma to the toe.  Reports yesterday, the toe started draining blood and clear liquid down the side.  She denies redness, although does endorse swelling and pain.  Reports it is bigger than her left toe and bigger than it usually is and there is a white pocket of what she thinks may be pus.  Denies bruising, numbness or tingling in her toes, fevers, nausea/vomiting.  She took ibuprofen before coming to urgent care today and reports she is in no pain.  Denies significant medical history; denies recent antibiotic use.  Patient reports she just moved here and does not have a primary care provider.  Her blood pressure is elevated and she denies vision changes, headache, lightheadedness/dizziness, chest pain, shortness of breath.    Past Medical History:  Diagnosis Date   No pertinent past medical history    Postpartum pain 06/30/2011   Vaginal delivery, moderate bleeding, tender in abd, esp LLQ    There are no problems to display for this patient.   Past Surgical History:  Procedure Laterality Date   DILATION AND CURETTAGE OF UTERUS     VAGINAL DELIVERY  06/30/11   Womens    OB History     Gravida  4   Para  2   Term  2   Preterm  0   AB  2   Living  2      SAB      IAB      Ectopic      Multiple      Live Births  1            Home Medications    Prior to Admission medications   Medication Sig Start Date End Date Taking? Authorizing Provider  sulfamethoxazole-trimethoprim (BACTRIM DS) 800-160 MG tablet Take 1 tablet by mouth 2 (two) times daily for 7 days. 12/20/21 12/27/21 Yes Eulogio Bear, NP    Family History Family History  Problem Relation Age of Onset   Anesthesia problems Neg Hx    Hypotension Neg Hx    Malignant hyperthermia Neg Hx    Pseudochol deficiency Neg Hx     Social History Social History   Tobacco Use   Smoking status: Former    Packs/day: 0.25    Years: 1.00    Total pack years: 0.25    Types: Cigarettes   Smokeless tobacco: Never  Substance Use Topics   Alcohol use: No   Drug use: No     Allergies   Patient has no known allergies.   Review of Systems Review of Systems Per HPI  Physical Exam Triage Vital Signs ED Triage Vitals  Enc Vitals Group     BP 12/20/21 1538 (!) 156/100     Pulse Rate 12/20/21 1538 66     Resp 12/20/21 1538 18     Temp 12/20/21 1538 97.9 F (36.6 C)     Temp src --      SpO2 12/20/21 1538 96 %     Weight --      Height --  Head Circumference --      Peak Flow --      Pain Score 12/20/21 1536 3     Pain Loc --      Pain Edu? --      Excl. in [place]? --    No data found.  Updated Vital Signs BP (!) 165/105 (BP Location: Right Wrist)   Pulse 66   Temp 97.9 F (36.6 C)   Resp 18   LMP 11/29/2021 (Approximate)   SpO2 96%   Visual Acuity Right Eye Distance:   Left Eye Distance:   Bilateral Distance:    Right Eye Near:   Left Eye Near:    Bilateral Near:     Physical Exam Vitals and nursing note reviewed.  Constitutional:      General: She is not in acute distress.    Appearance: Normal appearance. She is obese. She is not toxic-appearing.  HENT:     Mouth/Throat:     Mouth: Mucous membranes are moist.     Pharynx: Oropharynx is clear.  Pulmonary:     Effort: Pulmonary effort is normal. No respiratory distress.  Skin:    General: Skin is warm and dry.     Capillary Refill: Capillary refill takes less than 2 seconds.     Coloration: Skin is not jaundiced or pale.     Findings: Abscess present. No erythema.          Comments: Subungual abscess to right great toe.  Full range  of motion of the toe.  No redness, swelling extending into the midfoot.  Neurological:     Mental Status: She is alert and oriented to person, place, and time.  Psychiatric:        Behavior: Behavior is cooperative.      UC Treatments / Results  Labs (all labs ordered are listed, but only abnormal results are displayed) Labs Reviewed - No data to display  EKG   Radiology No results found.  Procedures Procedures (including critical care time)  Medications Ordered in UC Medications - No data to display  Initial Impression / Assessment and Plan / UC Course  I have reviewed the triage vital signs and the nursing notes.  Pertinent labs & imaging results that were available during my care of the patient were reviewed by me and considered in my medical decision making (see chart for details).    Patient is a very pleasant, well-appearing 36 year old female presenting for subungual abscess of right great toe today.  We discussed opening the pocket of infection today and patient is in agreement to this plan.  Chlorhexidine was used to clean the skin, then an 18-gauge needle was used to make a stab incision into the abscess.  Small amount of purulent drainage noted.  Patient tolerated well without complication.  Start Bactrim DS twice daily for 7 days along with warm Epsom salt soaks twice daily.  ER precautions discussed.  Return if symptoms do not improve with prescribed treatment.  Also of note while in urgent care, blood pressure elevated upon recheck.  Patient does not have any symptoms of endorgan damage today including no vision changes, chest pain, shortness of breath, dizziness or lightheadedness, headaches, or lower extremity swelling.  Encouraged monitoring blood pressure at home; seeking care if maintains greater than 140/90 at home.  We will assist with establishing primary care in the meantime.  If any new symptoms of high blood pressure develop, she is go to ER. Final Clinical  Impressions(s) /  UC Diagnoses   Final diagnoses:  Subungual abscess of toe, right  Elevated blood pressure reading in office without diagnosis of hypertension     Discharge Instructions      - Please start doing warm epsom salt soaks for 15 minutes twice daily to help with the infection in your toe - Start the Bactrim (antibiotic) to help clear up the infection - Return if symptoms worsen despite treatment - Please monitor BP at home; if elevated above 140/90 at home, follow up with primary care provivder    ED Prescriptions     Medication Sig Dispense Auth. Provider   sulfamethoxazole-trimethoprim (BACTRIM DS) 800-160 MG tablet Take 1 tablet by mouth 2 (two) times daily for 7 days. 14 tablet Eulogio Bear, NP      PDMP not reviewed this encounter.   Eulogio Bear, NP 12/20/21 1615

## 2021-12-20 NOTE — Discharge Instructions (Addendum)
-   Please start doing warm epsom salt soaks for 15 minutes twice daily to help with the infection in your toe - Start the Bactrim (antibiotic) to help clear up the infection - Return if symptoms worsen despite treatment - Please monitor BP at home; if elevated above 140/90 at home, follow up with primary care provivder

## 2022-01-10 ENCOUNTER — Ambulatory Visit (INDEPENDENT_AMBULATORY_CARE_PROVIDER_SITE_OTHER): Payer: Self-pay | Admitting: Family Medicine

## 2022-01-10 ENCOUNTER — Encounter: Payer: Self-pay | Admitting: Family Medicine

## 2022-01-10 VITALS — BP 153/96 | HR 67 | Ht 66.0 in | Wt 386.0 lb

## 2022-01-10 DIAGNOSIS — I10 Essential (primary) hypertension: Secondary | ICD-10-CM

## 2022-01-10 DIAGNOSIS — R399 Unspecified symptoms and signs involving the genitourinary system: Secondary | ICD-10-CM

## 2022-01-10 DIAGNOSIS — R7301 Impaired fasting glucose: Secondary | ICD-10-CM

## 2022-01-10 DIAGNOSIS — N3090 Cystitis, unspecified without hematuria: Secondary | ICD-10-CM

## 2022-01-10 DIAGNOSIS — Z1159 Encounter for screening for other viral diseases: Secondary | ICD-10-CM

## 2022-01-10 DIAGNOSIS — E559 Vitamin D deficiency, unspecified: Secondary | ICD-10-CM

## 2022-01-10 LAB — POCT URINALYSIS DIP (CLINITEK)
Bilirubin, UA: NEGATIVE
Blood, UA: NEGATIVE
Glucose, UA: NEGATIVE mg/dL
Ketones, POC UA: NEGATIVE mg/dL
Nitrite, UA: NEGATIVE
POC PROTEIN,UA: NEGATIVE
Spec Grav, UA: 1.02 (ref 1.010–1.025)
Urobilinogen, UA: 0.2 E.U./dL
pH, UA: 6.5 (ref 5.0–8.0)

## 2022-01-10 MED ORDER — PANTOPRAZOLE SODIUM 20 MG PO TBEC: 20.0000 mg | DELAYED_RELEASE_TABLET | Freq: Every day | ORAL | 0 refills | Status: DC

## 2022-01-10 MED ORDER — TELMISARTAN 40 MG PO TABS: 40.0000 mg | ORAL_TABLET | Freq: Every day | ORAL | 1 refills | Status: DC

## 2022-01-10 MED ORDER — SULFAMETHOXAZOLE-TRIMETHOPRIM 800-160 MG PO TABS: 1.0000 | ORAL_TABLET | Freq: Two times a day (BID) | ORAL | 0 refills | Status: AC

## 2022-01-10 NOTE — Assessment & Plan Note (Signed)
C/o of urgency and frequency with urination  Urine dipstick shows small leukocytes  Will treat with bactrim for 5 days C/o of epigastric abdominal pain She notes drinking sodas and orange juice Trail of Protonix ordered

## 2022-01-10 NOTE — Assessment & Plan Note (Signed)
Reports a weight gain of 60 lbs since moving to [place] She admits to unhealthy eating habits with minimum physical activity Encouraged a heart-healthy diet and increased her physical activities  Recommended exercising for 17mns at least three times a week  She reports that she has been implementing intermittent fasting for 1 week Continue intermittent fasting with f/u in 1 month

## 2022-01-10 NOTE — Progress Notes (Signed)
New Patient Office Visit  Subjective:  Patient ID: [name], female    DOB: 12/06/1985  Age: 36 y.o. MRN: [id number]  CC:  Chief Complaint  Patient presents with   New Patient (Initial Visit)    Establishing care, [place] of state. Would like to discuss high bp, weight gain, and some dizziness.    HPI [name] is a 36 y.o. female with past medical history of HTN presents for establishing care. HTN: uncontrolled. Reports elevated ambulatory BP in the 440N systolic and 02V diastolic with occasional dizziness and blurred vision.  Weight gained: reports a weight gain of 60 lbs since moving to [place]. She admits to unhealthy eating habits with minimum physical activity. He has been implementing intermittent fasting for 1 week.  SDOH: relocated from [place] to [place] a month ago with her 3 children. She has 2 boys and a girl.  Abdominal Pain: c/o of intermittent abdominal pain with no nausea or vomiting. No changes in bowel movements. She notes drinking sodas and orange juice. She mentions having urgency and frequency with urination.  Past Medical History:  Diagnosis Date   No pertinent past medical history    Postpartum pain 06/30/2011   Vaginal delivery, moderate bleeding, tender in abd, esp LLQ    Past Surgical History:  Procedure Laterality Date   DILATION AND CURETTAGE OF UTERUS     VAGINAL DELIVERY  06/30/11   Womens    Family History  Problem Relation Age of Onset   Anesthesia problems Neg Hx    Hypotension Neg Hx    Malignant hyperthermia Neg Hx    Pseudochol deficiency Neg Hx     Social History   Socioeconomic History   Marital status: Single    Spouse name: Not on file   Number of children: Not on file   Years of education: Not on file   Highest education level: Not on file  Occupational History   Not on file  Tobacco Use   Smoking status: Former    Packs/day: 0.25    Years: 1.00    Total pack years: 0.25    Types: Cigarettes    Smokeless tobacco: Never  Substance and Sexual Activity   Alcohol use: No   Drug use: No   Sexual activity: Not Currently    Birth control/protection: None    Comment: pt is currently pregnant  Other Topics Concern   Not on file  Social History Narrative   Not on file   Social Determinants of Health   Financial Resource Strain: Not on file  Food Insecurity: Not on file  Transportation Needs: Not on file  Physical Activity: Not on file  Stress: Not on file  Social Connections: Not on file  Intimate Partner Violence: Not on file    ROS Review of Systems  Constitutional:  Negative for fatigue and fever.  HENT:  Negative for sinus pressure, sinus pain and sore throat.   Eyes:  Negative for visual disturbance.  Respiratory:  Negative for chest tightness and shortness of breath.   Cardiovascular:  Negative for chest pain and palpitations.  Gastrointestinal:  Positive for abdominal pain. Negative for constipation, diarrhea, nausea and vomiting.  Endocrine: Negative for heat intolerance, polyphagia and polyuria.  Genitourinary:  Negative for genital sores.  Musculoskeletal:  Negative for myalgias and neck pain.  Skin:  Negative for rash and wound.  Neurological:  Positive for headaches (mild). Negative for dizziness, tremors and weakness.  Hematological:  Does  not bruise/bleed easily.  Psychiatric/Behavioral:  Negative for self-injury and suicidal ideas.     Objective:   Today's Vitals: BP (!) 153/96   Pulse 67   Ht _0  (1.676 m)   Wt (!) 386 lb (175.1 kg)   LMP 11/29/2021 (Approximate)   SpO2 97%   BMI 62.30 kg/m   Physical Exam Constitutional:      Appearance: She is obese.  HENT:     Head: Normocephalic.     Nose: No congestion.     Mouth/Throat:     Mouth: Mucous membranes are moist.  Eyes:     Extraocular Movements: Extraocular movements intact.     Pupils: Pupils are equal, round, and reactive to light.  Cardiovascular:     Rate and Rhythm: Normal rate  and regular rhythm.     Pulses: Normal pulses.     Heart sounds: Normal heart sounds.  Pulmonary:     Effort: Pulmonary effort is normal.     Breath sounds: Normal breath sounds.  Abdominal:     Tenderness: There is no abdominal tenderness. There is no right CVA tenderness or left CVA tenderness.  Musculoskeletal:     Cervical back: No rigidity.     Right lower leg: No edema.     Left lower leg: No edema.  Skin:    General: Skin is warm.  Neurological:     Mental Status: She is alert and oriented to person, place, and time.  Psychiatric:     Comments: Normal affect     Assessment & Plan:   Problem List Items Addressed This Visit       Cardiovascular and Mediastinum   Essential hypertension - Primary    Uncontrolled Reports elevated ambulatory BP in the 808U systolic and 11S diastolic with occasional dizziness and blurred vision Will start pt on telmisartan 40 mg and f/u in 1 month Dash diet reviewed Encouraged low sodium diet Encouraged increasing physical activities       Relevant Medications   telmisartan (MICARDIS) 40 MG tablet   Other Relevant Orders   CBC with Differential/Platelet   CMP14+EGFR   TSH + free T4   Lipid Profile     Genitourinary   Cystitis    C/o of urgency and frequency with urination  Urine dipstick shows small leukocytes  Will treat with bactrim for 5 days C/o of epigastric abdominal pain She notes drinking sodas and orange juice Trail of Protonix ordered        Other   Morbid obesity ([place])    Reports a weight gain of 60 lbs since moving to [place] She admits to unhealthy eating habits with minimum physical activity Encouraged a heart-healthy diet and increased her physical activities  Recommended exercising for 57mns at least three times a week  She reports that she has been implementing intermittent fasting for 1 week Continue intermittent fasting with f/u in 1 month       Other Visit Diagnoses     Need for hepatitis C  screening test       Relevant Orders   Hepatitis C Antibody   Vitamin D deficiency       Relevant Orders   Vitamin D (25 hydroxy)   IFG (impaired fasting glucose)       Relevant Orders   Hemoglobin A1C   UTI symptoms       Relevant Orders   POCT URINALYSIS DIP (CLINITEK) (Completed)       Outpatient Encounter Medications as of 01/10/2022  Medication Sig   pantoprazole (PROTONIX) 20 MG tablet Take 1 tablet (20 mg total) by mouth daily.   sulfamethoxazole-trimethoprim (BACTRIM DS) 800-160 MG tablet Take 1 tablet by mouth 2 (two) times daily for 5 days.   telmisartan (MICARDIS) 40 MG tablet Take 1 tablet (40 mg total) by mouth daily.   No facility-administered encounter medications on file as of 01/10/2022.    Follow-up: Return in about 1 month (around 02/10/2022) for bp.   Alvira Monday, FNP

## 2022-01-10 NOTE — Assessment & Plan Note (Signed)
Uncontrolled Reports elevated ambulatory BP in the 001V systolic and 49S diastolic with occasional dizziness and blurred vision Will start pt on telmisartan 40 mg and f/u in 1 month Dash diet reviewed Encouraged low sodium diet Encouraged increasing physical activities

## 2022-01-10 NOTE — Patient Instructions (Addendum)
I appreciate the opportunity to provide care to you today!    Follow up:  1 months  Labs: please stop by the lab today to get your blood drawn (CBC, CMP, TSH, Lipid profile, HgA1c, Vit D)  Screening: Hep C   Please pick up your medications at the pharmacy ( telmisartan, bactriand protonix)  Telmisartan -Take each dose at the same time each day, with or without food -in the first few days of therapy you may experience dizziness/light-headedness from the body adjusting to a lower blood pressure -Avoid use of over-the-counter NSAIDs (e.g. ibuprofen, naproxen) -Use of NSAIDs may result in increased risk of renal impairment (including acute renal failure) and decreased antihypertensive effect.     Please continue to a heart-healthy diet and increase your physical activities. Try to exercise for 49mns at least three times a week.      It was a pleasure to see you and I look forward to continuing to work together on your health and well-being. Please do not hesitate to call the office if you need care or have questions about your care.   Have a wonderful day and week. With Gratitude, GAlvira MondayMSN, FNP-BC

## 2022-01-10 NOTE — Progress Notes (Signed)
New

## 2022-01-12 ENCOUNTER — Other Ambulatory Visit: Payer: Self-pay | Admitting: Family Medicine

## 2022-01-12 DIAGNOSIS — E559 Vitamin D deficiency, unspecified: Secondary | ICD-10-CM

## 2022-01-12 LAB — CBC WITH DIFFERENTIAL/PLATELET
Basophils Absolute: 0 10*3/uL (ref 0.0–0.2)
Basos: 1 %
EOS (ABSOLUTE): 0.1 10*3/uL (ref 0.0–0.4)
Eos: 1 %
Hematocrit: 35.6 % (ref 34.0–46.6)
Hemoglobin: 11.6 g/dL (ref 11.1–15.9)
Immature Grans (Abs): 0 10*3/uL (ref 0.0–0.1)
Immature Granulocytes: 0 %
Lymphocytes Absolute: 2 10*3/uL (ref 0.7–3.1)
Lymphs: 35 %
MCH: 28.2 pg (ref 26.6–33.0)
MCHC: 32.6 g/dL (ref 31.5–35.7)
MCV: 87 fL (ref 79–97)
Monocytes Absolute: 0.5 10*3/uL (ref 0.1–0.9)
Monocytes: 9 %
Neutrophils Absolute: 3.2 10*3/uL (ref 1.4–7.0)
Neutrophils: 54 %
Platelets: 219 10*3/uL (ref 150–450)
RBC: 4.11 x10E6/uL (ref 3.77–5.28)
RDW: 14.1 % (ref 11.7–15.4)
WBC: 5.8 10*3/uL (ref 3.4–10.8)

## 2022-01-12 LAB — CMP14+EGFR
ALT: 13 IU/L (ref 0–32)
AST: 12 IU/L (ref 0–40)
Albumin/Globulin Ratio: 1.2 (ref 1.2–2.2)
Albumin: 3.6 g/dL — ABNORMAL LOW (ref 3.9–4.9)
Alkaline Phosphatase: 60 IU/L (ref 44–121)
BUN/Creatinine Ratio: 13 (ref 9–23)
BUN: 9 mg/dL (ref 6–20)
Bilirubin Total: 0.3 mg/dL (ref 0.0–1.2)
CO2: 21 mmol/L (ref 20–29)
Calcium: 9.8 mg/dL (ref 8.7–10.2)
Chloride: 105 mmol/L (ref 96–106)
Creatinine, Ser: 0.72 mg/dL (ref 0.57–1.00)
Globulin, Total: 3 g/dL (ref 1.5–4.5)
Glucose: 96 mg/dL (ref 70–99)
Potassium: 4.7 mmol/L (ref 3.5–5.2)
Sodium: 137 mmol/L (ref 134–144)
Total Protein: 6.6 g/dL (ref 6.0–8.5)
eGFR: 111 mL/min/{1.73_m2} (ref >59)

## 2022-01-12 LAB — HEMOGLOBIN A1C
Est. average glucose Bld gHb Est-mCnc: 111 mg/dL
Hgb A1c MFr Bld: 5.5 % (ref 4.8–5.6)

## 2022-01-12 LAB — TSH+FREE T4
Free T4: 1.11 ng/dL (ref 0.82–1.77)
TSH: 1.43 u[IU]/mL (ref 0.450–4.500)

## 2022-01-12 LAB — LIPID PANEL
Chol/HDL Ratio: 3.3 ratio (ref 0.0–4.4)
Cholesterol, Total: 181 mg/dL (ref 100–199)
HDL: 55 mg/dL (ref >39)
LDL Chol Calc (NIH): 98 mg/dL (ref 0–99)
Triglycerides: 165 mg/dL — ABNORMAL HIGH (ref 0–149)
VLDL Cholesterol Cal: 28 mg/dL (ref 5–40)

## 2022-01-12 LAB — VITAMIN D 25 HYDROXY (VIT D DEFICIENCY, FRACTURES): Vit D, 25-Hydroxy: 8.9 ng/mL — ABNORMAL LOW (ref 30.0–100.0)

## 2022-01-12 LAB — HEPATITIS C ANTIBODY: Hep C Virus Ab: NONREACTIVE

## 2022-01-12 MED ORDER — VITAMIN D (ERGOCALCIFEROL) 1.25 MG (50000 UNIT) PO CAPS: 50000.0000 [IU] | ORAL_CAPSULE | ORAL | 1 refills | Status: DC

## 2022-01-12 NOTE — Progress Notes (Signed)
Please inform the patient that her vit.D is low. A prescription for Vit. D supplement has been sent to her pharmacy

## 2022-02-10 ENCOUNTER — Ambulatory Visit (INDEPENDENT_AMBULATORY_CARE_PROVIDER_SITE_OTHER): Payer: Self-pay | Admitting: Family Medicine

## 2022-02-10 ENCOUNTER — Encounter: Payer: Self-pay | Admitting: Family Medicine

## 2022-02-10 DIAGNOSIS — I10 Essential (primary) hypertension: Secondary | ICD-10-CM

## 2022-02-10 DIAGNOSIS — M25561 Pain in right knee: Secondary | ICD-10-CM

## 2022-02-10 DIAGNOSIS — M25562 Pain in left knee: Secondary | ICD-10-CM

## 2022-02-10 NOTE — Patient Instructions (Addendum)
I appreciate the opportunity to provide care to you today!    Follow up:  1 months     Please continue to a heart-healthy diet and increase your physical activities. Try to exercise for 61mns at least three times a week.      It was a pleasure to see you and I look forward to continuing to work together on your health and well-being. Please do not hesitate to call the office if you need care or have questions about your care.   Have a wonderful day and week. With Gratitude, GAlvira MondayMSN, FNP-BC

## 2022-02-10 NOTE — Assessment & Plan Note (Signed)
Knee pain with no signs of inflammation or infection BMI 61.50 kg/m Encouraged pt to increase her physical activities to maintain a healthier BMI Encouraged use of otc analgesic as needed

## 2022-02-10 NOTE — Assessment & Plan Note (Signed)
Discuss lifestyle modifications, including healthy eating habits and increasing physical activities. Also discuss intermittent fasting, weight loss pills, and injections Patient will implement lifestyle changes first and look into other options Will f/u in 1 montth

## 2022-02-10 NOTE — Assessment & Plan Note (Addendum)
Controlled Continue taking Telmisartan 40 mg daily

## 2022-02-10 NOTE — Progress Notes (Signed)
Established Patient Office Visit  Subjective:  Patient ID: [name], female    DOB: [birth year]  Age: 36 y.o. MRN: [id number]  CC:  Chief Complaint  Patient presents with   Follow-up    1 month f/u, states bp is better has been taking medications as prescribed. C/o knee pain that flares up on and off onset of this began 01/13/22. Will come back for pap smear.     HPI [name] is a 36 y.o. female with past medical history of HTN presents for f/u of  chronic medical conditions.  HTN: Controlled. Denies headaches, dizziness, and blurred vision  Bilateral knee pain: BMI is 61.50 kg/m. she c/o of intermittent knee pain for which she takes Motrin as needed. She denies swelling, warmth, fever, and known injury.  Obesity: She reports trying to eat healthier but has yet to increase her physical activities. She would like to explore weight loss options.    Past Medical History:  Diagnosis Date   No pertinent past medical history    Postpartum pain 06/30/2011   Vaginal delivery, moderate bleeding, tender in abd, esp LLQ    Past Surgical History:  Procedure Laterality Date   DILATION AND CURETTAGE OF UTERUS     VAGINAL DELIVERY  06/30/11   Womens    Family History  Problem Relation Age of Onset   Anesthesia problems Neg Hx    Hypotension Neg Hx    Malignant hyperthermia Neg Hx    Pseudochol deficiency Neg Hx     Social History   Socioeconomic History   Marital status: Single    Spouse name: Not on file   Number of children: Not on file   Years of education: Not on file   Highest education level: Not on file  Occupational History   Not on file  Tobacco Use   Smoking status: Former    Packs/day: 0.25    Years: 1.00    Total pack years: 0.25    Types: Cigarettes   Smokeless tobacco: Never  Substance and Sexual Activity   Alcohol use: No   Drug use: No   Sexual activity: Not Currently    Birth control/protection: None    Comment: pt is currently pregnant   Other Topics Concern   Not on file  Social History Narrative   Not on file   Social Determinants of Health   Financial Resource Strain: Not on file  Food Insecurity: Not on file  Transportation Needs: Not on file  Physical Activity: Not on file  Stress: Not on file  Social Connections: Not on file  Intimate Partner Violence: Not on file    Outpatient Medications Prior to Visit  Medication Sig Dispense Refill   telmisartan (MICARDIS) 40 MG tablet Take 1 tablet (40 mg total) by mouth daily. 90 tablet 1   Vitamin D, Ergocalciferol, (DRISDOL) 1.25 MG (50000 UNIT) CAPS capsule Take 1 capsule (50,000 Units total) by mouth every 7 (seven) days. 5 capsule 1   pantoprazole (PROTONIX) 20 MG tablet Take 1 tablet (20 mg total) by mouth daily. 30 tablet 0   No facility-administered medications prior to visit.    Not on File  ROS Review of Systems  Constitutional:  Negative for fever.  Respiratory:  Negative for chest tightness and shortness of breath.   Musculoskeletal:        Knee pain  Neurological:  Negative for dizziness and headaches.      Objective:    Physical Exam Constitutional:  Appearance: She is obese.  Cardiovascular:     Rate and Rhythm: Normal rate and regular rhythm.     Pulses: Normal pulses.     Heart sounds: Normal heart sounds.  Pulmonary:     Effort: Pulmonary effort is normal.     Breath sounds: Normal breath sounds.  Musculoskeletal:     Cervical back: Normal range of motion.     Right knee: No swelling, deformity, effusion or erythema. Normal range of motion. No tenderness.     Left knee: No swelling, deformity, effusion or erythema. Normal range of motion. No tenderness.  Neurological:     Mental Status: She is alert.     BP 127/82   Pulse 77   Ht _0  (1.676 m)   Wt (!) 381 lb (172.8 kg)   SpO2 94%   BMI 61.50 kg/m  Wt Readings from Last 3 Encounters:  02/10/22 (!) 381 lb (172.8 kg)  01/10/22 (!) 386 lb (175.1 kg)  07/29/11 255  lb 8.2 oz (115.9 kg)    Lab Results  Component Value Date   TSH 1.430 01/11/2022   Lab Results  Component Value Date   WBC 5.8 01/11/2022   HGB 11.6 01/11/2022   HCT 35.6 01/11/2022   MCV 87 01/11/2022   PLT 219 01/11/2022   Lab Results  Component Value Date   NA 137 01/11/2022   K 4.7 01/11/2022   CO2 21 01/11/2022   GLUCOSE 96 01/11/2022   BUN 9 01/11/2022   CREATININE 0.72 01/11/2022   BILITOT 0.3 01/11/2022   ALKPHOS 60 01/11/2022   AST 12 01/11/2022   ALT 13 01/11/2022   PROT 6.6 01/11/2022   ALBUMIN 3.6 (L) 01/11/2022   CALCIUM 9.8 01/11/2022   EGFR 111 01/11/2022   Lab Results  Component Value Date   CHOL 181 01/11/2022   Lab Results  Component Value Date   HDL 55 01/11/2022   Lab Results  Component Value Date   LDLCALC 98 01/11/2022   Lab Results  Component Value Date   TRIG 165 (H) 01/11/2022   Lab Results  Component Value Date   CHOLHDL 3.3 01/11/2022   Lab Results  Component Value Date   HGBA1C 5.5 01/11/2022      Assessment & Plan:   Problem List Items Addressed This Visit       Cardiovascular and Mediastinum   Essential hypertension    Controlled Continue taking Telmisartan 40 mg daily        Other   Morbid obesity ([place]) - Primary    Discuss lifestyle modifications, including healthy eating habits and increasing physical activities. Also discuss intermittent fasting, weight loss pills, and injections Patient will implement lifestyle changes first and look into other options Will f/u in 1 montth       Knee pain, bilateral    Knee pain with no signs of inflammation or infection BMI 61.50 kg/m Encouraged pt to increase her physical activities to maintain a healthier BMI Encouraged use of otc analgesic as needed        No orders of the defined types were placed in this encounter.   Follow-up: Return in about 1 month (around 03/12/2022) for obesity/ weight management.    Alvira Monday, FNP

## 2022-02-16 ENCOUNTER — Telehealth: Payer: Self-pay | Admitting: Family Medicine

## 2022-02-16 ENCOUNTER — Other Ambulatory Visit: Payer: Self-pay

## 2022-02-16 DIAGNOSIS — E559 Vitamin D deficiency, unspecified: Secondary | ICD-10-CM

## 2022-02-16 MED ORDER — VITAMIN D (ERGOCALCIFEROL) 1.25 MG (50000 UNIT) PO CAPS: 50000.0000 [IU] | ORAL_CAPSULE | ORAL | 1 refills | Status: DC

## 2022-02-16 NOTE — Telephone Encounter (Signed)
Medication resent to correct pharmacy

## 2022-02-16 NOTE — Telephone Encounter (Signed)
Pt called stating her medication was sent to wrong phar. Can you please resend to correct pharmacy??   Vitamin D, Ergocalciferol, (DRISDOL) 1.25 MG (50000 UNIT) CAPS capsule   [address]

## 2022-03-11 ENCOUNTER — Encounter: Payer: Self-pay | Admitting: Family Medicine

## 2022-03-11 ENCOUNTER — Ambulatory Visit: Payer: 59 | Admitting: Family Medicine

## 2022-03-11 DIAGNOSIS — E559 Vitamin D deficiency, unspecified: Secondary | ICD-10-CM

## 2022-03-11 DIAGNOSIS — I10 Essential (primary) hypertension: Secondary | ICD-10-CM

## 2022-03-11 MED ORDER — TELMISARTAN-HCTZ 40-12.5 MG PO TABS
1.0000 | ORAL_TABLET | Freq: Every day | ORAL | 0 refills | Status: DC
  Filled 2022-03-15 – 2022-04-09 (×2): qty 90, 90d supply, fill #0
  Filled 2022-07-08 – 2022-07-11 (×2): qty 60, 60d supply, fill #1

## 2022-03-11 MED ORDER — VITAMIN D (ERGOCALCIFEROL) 1.25 MG (50000 UNIT) PO CAPS: 50000.0000 [IU] | ORAL_CAPSULE | ORAL | 0 refills | Status: DC

## 2022-03-11 MED ORDER — WEGOVY 0.25 MG/0.5ML ~~LOC~~ SOAJ: 0.2500 mg | SUBCUTANEOUS | 0 refills | Status: DC

## 2022-03-11 NOTE — Assessment & Plan Note (Addendum)
BP Readings from Last 3 Encounters:  03/11/22 (!) 152/100  02/10/22 127/82  01/10/22 (!) 153/96   Uncontrolled She reports taking telmisartan 40 mg daily She denies headaches, dizziness,  blurred vision,  chest pain, and palpitations Will start patient on telmisartan hydrochlorothiazide 40- 12.5 mg Dash diet reviewed Encouraged low-sodium diet Follow-up in 1 month

## 2022-03-11 NOTE — Progress Notes (Signed)
Established Patient Office Visit  Subjective:  Patient ID: [name], female    DOB: 1985/10/06  Age: 36 y.o. 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     HPI [name] is a 36 y.o. female with past medical history of hypertension and obesity presents for f/u of  chronic medical conditions.   Hypertension: Uncontrolled. She reports taking telmisartan 40 mg daily. She denies headaches, dizziness,  blurred vision,  chest pain, and palpitations.  Obesity: She reports that she has been trying to eat healthier with minimal physical activities. She reports that she has not been physically active due to pain in her knees. She would like to be started on weight loss therapy today.  Past Medical History:  Diagnosis Date   No pertinent past medical history    Postpartum pain 06/30/2011   Vaginal delivery, moderate bleeding, tender in abd, esp LLQ    Past Surgical History:  Procedure Laterality Date   DILATION AND CURETTAGE OF UTERUS     VAGINAL DELIVERY  06/30/11   Womens    Family History  Problem Relation Age of Onset   Anesthesia problems Neg Hx    Hypotension Neg Hx    Malignant hyperthermia Neg Hx    Pseudochol deficiency Neg Hx     Social History   Socioeconomic History   Marital status: Single    Spouse name: Not on file   Number of children: Not on file   Years of education: Not on file   Highest education level: Not on file  Occupational History   Not on file  Tobacco Use   Smoking status: Former    Packs/day: 0.25    Years: 1.00    Total pack years: 0.25    Types: Cigarettes   Smokeless tobacco: Never  Substance and Sexual Activity   Alcohol use: No   Drug use: No   Sexual activity: Not Currently    Birth  control/protection: None    Comment: pt is currently pregnant  Other Topics Concern   Not on file  Social History Narrative   Not on file   Social Determinants of Health   Financial Resource Strain: Not on file  Food Insecurity: Not on file  Transportation Needs: Not on file  Physical Activity: Not on file  Stress: Not on file  Social Connections: Not on file  Intimate Partner Violence: Not on file    Outpatient Medications Prior to Visit  Medication Sig Dispense Refill   telmisartan (MICARDIS) 40 MG tablet Take 1 tablet (40 mg total) by mouth daily. 90 tablet 1   Vitamin D, Ergocalciferol, (DRISDOL) 1.25 MG (50000 UNIT) CAPS capsule Take 1 capsule (50,000 Units total) by mouth every 7 (seven) days. 5 capsule 1   No facility-administered medications prior to visit.    Not on File  ROS Review of Systems  Constitutional:  Negative for fatigue and fever.  Eyes:  Negative for photophobia, redness and visual disturbance.  Respiratory:  Negative for chest tightness and shortness of breath.   Cardiovascular:  Negative for chest pain and palpitations.  Neurological:  Negative for dizziness and headaches.  Psychiatric/Behavioral:  Negative for self-injury and suicidal ideas.       Objective:  Physical Exam HENT:     Head: Normocephalic.     Right Ear: External ear normal.     Nose: No congestion.  Cardiovascular:     Rate and Rhythm: Normal rate and regular rhythm.     Pulses: Normal pulses.     Heart sounds: Normal heart sounds.  Pulmonary:     Effort: Pulmonary effort is normal.     Breath sounds: Normal breath sounds.  Neurological:     Mental Status: She is alert.     BP (!) 152/100 (BP Location: Left Arm)   Pulse 82   Ht $R'5\' 6"'lr$  (1.676 m)   Wt (!) 387 lb 0.6 oz (175.6 kg)   LMP 03/07/2022   SpO2 94%   BMI 62.47 kg/m  Wt Readings from Last 3 Encounters:  03/11/22 (!) 387 lb 0.6 oz (175.6 kg)  02/10/22 (!) 381 lb (172.8 kg)  01/10/22 (!) 386 lb (175.1  kg)    Lab Results  Component Value Date   TSH 1.430 01/11/2022   Lab Results  Component Value Date   WBC 5.8 01/11/2022   HGB 11.6 01/11/2022   HCT 35.6 01/11/2022   MCV 87 01/11/2022   PLT 219 01/11/2022   Lab Results  Component Value Date   NA 137 01/11/2022   K 4.7 01/11/2022   CO2 21 01/11/2022   GLUCOSE 96 01/11/2022   BUN 9 01/11/2022   CREATININE 0.72 01/11/2022   BILITOT 0.3 01/11/2022   ALKPHOS 60 01/11/2022   AST 12 01/11/2022   ALT 13 01/11/2022   PROT 6.6 01/11/2022   ALBUMIN 3.6 (L) 01/11/2022   CALCIUM 9.8 01/11/2022   EGFR 111 01/11/2022   Lab Results  Component Value Date   CHOL 181 01/11/2022   Lab Results  Component Value Date   HDL 55 01/11/2022   Lab Results  Component Value Date   LDLCALC 98 01/11/2022   Lab Results  Component Value Date   TRIG 165 (H) 01/11/2022   Lab Results  Component Value Date   CHOLHDL 3.3 01/11/2022   Lab Results  Component Value Date   HGBA1C 5.5 01/11/2022      Assessment & Plan:   Problem List Items Addressed This Visit       Cardiovascular and Mediastinum   Essential hypertension    BP Readings from Last 3 Encounters:  03/11/22 (!) 152/100  02/10/22 127/82  01/10/22 (!) 153/96  Uncontrolled She reports taking telmisartan 40 mg daily She denies headaches, dizziness,  blurred vision,  chest pain, and palpitations Will start patient on telmisartan hydrochlorothiazide 40- 12.5 mg Dash diet reviewed Encouraged low-sodium diet Follow-up in 1 month      Relevant Medications   telmisartan-hydrochlorothiazide (MICARDIS HCT) 40-12.5 MG tablet     Other   Morbid obesity ([place]) - Primary    Wt Readings from Last 3 Encounters:  03/11/22 (!) 387 lb 0.6 oz (175.6 kg)  02/10/22 (!) 381 lb (172.8 kg)  01/10/22 (!) 386 lb (175.1 kg)  She reports that she has been trying to eat healthier with minimal physical activities She reports that she has not been physically active due to pain in her knees She  would like to be started on weight loss therapy today She denies personal or family history of medullary thyroid carcinoma and pancreatitis She will be started on Wegovy 0.25 mg once weekly subcutaneous injection We will follow-up in a month to increase dose as needed      Relevant Medications  Semaglutide-Weight Management (WEGOVY) 0.25 MG/0.5ML SOAJ   Other Visit Diagnoses     Vitamin D deficiency       Relevant Medications   Vitamin D, Ergocalciferol, (DRISDOL) 1.25 MG (50000 UNIT) CAPS capsule       Meds ordered this encounter  Medications   telmisartan-hydrochlorothiazide (MICARDIS HCT) 40-12.5 MG tablet    Sig: Take 1 tablet by mouth daily.    Dispense:  90 tablet    Refill:  1   Semaglutide-Weight Management (WEGOVY) 0.25 MG/0.5ML SOAJ    Sig: Inject 0.25 mg into the skin once a week.    Dispense:  2 mL    Refill:  0   Vitamin D, Ergocalciferol, (DRISDOL) 1.25 MG (50000 UNIT) CAPS capsule    Sig: Take 1 capsule (50,000 Units total) by mouth every 7 (seven) days.    Dispense:  13 capsule    Refill:  0    Follow-up: Return in about 1 month (around 04/10/2022) for BP and weight.    Alvira Monday, FNP

## 2022-03-11 NOTE — Assessment & Plan Note (Signed)
Wt Readings from Last 3 Encounters:  03/11/22 (!) 387 lb 0.6 oz (175.6 kg)  02/10/22 (!) 381 lb (172.8 kg)  01/10/22 (!) 386 lb (175.1 kg)   She reports that she has been trying to eat healthier with minimal physical activities She reports that she has not been physically active due to pain in her knees She would like to be started on weight loss therapy today She denies personal or family history of medullary thyroid carcinoma and pancreatitis She will be started on Wegovy 0.25 mg once weekly subcutaneous injection We will follow-up in a month to increase dose as needed

## 2022-03-11 NOTE — Patient Instructions (Signed)
I appreciate the opportunity to provide care to you today!    Follow up:  1 month  Please pick up your medication at the pharmacy   Please continue to a heart-healthy diet and increase your physical activities. Try to exercise for 44mns at least three times a week.      It was a pleasure to see you and I look forward to continuing to work together on your health and well-being. Please do not hesitate to call the office if you need care or have questions about your care.   Have a wonderful day and week. With Gratitude, GAlvira MondayMSN, FNP-BC

## 2022-03-14 ENCOUNTER — Telehealth: Payer: Self-pay | Admitting: Family Medicine

## 2022-03-14 NOTE — Telephone Encounter (Signed)
Please advice on alternative?

## 2022-03-14 NOTE — Telephone Encounter (Signed)
Patient called in regard to Semaglutide-Weight Management (WEGOVY) 0.25 MG/0.5ML SOAJ   Med is on a [place] wide back order. Wants a cal back in regard to alternative med

## 2022-03-15 ENCOUNTER — Encounter: Payer: Self-pay | Admitting: Family Medicine

## 2022-03-15 ENCOUNTER — Other Ambulatory Visit: Payer: Self-pay

## 2022-03-15 ENCOUNTER — Other Ambulatory Visit (HOSPITAL_COMMUNITY): Payer: Self-pay

## 2022-03-15 MED ORDER — WEGOVY 0.25 MG/0.5ML ~~LOC~~ SOAJ
0.2500 mg | SUBCUTANEOUS | 0 refills | Status: DC
  Filled 2022-03-15: qty 2, 28d supply, fill #0

## 2022-03-15 NOTE — Telephone Encounter (Signed)
Pt has been informed.

## 2022-03-15 NOTE — Telephone Encounter (Signed)
Patient calling back to the office asked if can transfer to [facility]. Contact patient at [phone]

## 2022-03-15 NOTE — Telephone Encounter (Signed)
Pharamcy updated in chart, wegovy is on backorder at most pharmacies would like an alternative?

## 2022-03-15 NOTE — Telephone Encounter (Signed)
Left vm for pt to return call  

## 2022-03-15 NOTE — Telephone Encounter (Signed)
Please inform the patient that her options are few because her blood pressure is not well controlled. We can discussed oral therapy once her BP is well controlled

## 2022-03-17 ENCOUNTER — Other Ambulatory Visit (HOSPITAL_COMMUNITY): Payer: Self-pay

## 2022-03-19 ENCOUNTER — Other Ambulatory Visit (HOSPITAL_COMMUNITY): Payer: Self-pay

## 2022-04-08 ENCOUNTER — Encounter: Payer: Self-pay | Admitting: Family Medicine

## 2022-04-08 NOTE — Telephone Encounter (Signed)
Please schedule the patient for an office visit in 2 weeks.

## 2022-04-09 ENCOUNTER — Other Ambulatory Visit (HOSPITAL_COMMUNITY): Payer: Self-pay

## 2022-04-11 ENCOUNTER — Encounter (HOSPITAL_COMMUNITY): Payer: Self-pay

## 2022-04-11 ENCOUNTER — Other Ambulatory Visit (HOSPITAL_COMMUNITY): Payer: Self-pay

## 2022-04-13 ENCOUNTER — Other Ambulatory Visit (HOSPITAL_COMMUNITY): Payer: Self-pay

## 2022-04-14 ENCOUNTER — Other Ambulatory Visit (HOSPITAL_COMMUNITY): Payer: Self-pay

## 2022-04-15 ENCOUNTER — Ambulatory Visit: Payer: 59 | Admitting: Family Medicine

## 2022-04-21 ENCOUNTER — Other Ambulatory Visit (HOSPITAL_COMMUNITY): Payer: Self-pay

## 2022-04-22 ENCOUNTER — Encounter: Payer: Self-pay | Admitting: Family Medicine

## 2022-04-22 ENCOUNTER — Other Ambulatory Visit (HOSPITAL_COMMUNITY): Payer: Self-pay

## 2022-04-22 ENCOUNTER — Ambulatory Visit: Payer: 59 | Admitting: Family Medicine

## 2022-04-22 DIAGNOSIS — Z803 Family history of malignant neoplasm of breast: Secondary | ICD-10-CM | POA: Diagnosis not present

## 2022-04-22 MED ORDER — PHENTERMINE HCL 15 MG PO CAPS
15.0000 mg | ORAL_CAPSULE | ORAL | 0 refills | Status: DC
  Filled 2022-04-22: qty 30, 30d supply, fill #0

## 2022-04-22 NOTE — Assessment & Plan Note (Signed)
Will order imaging studies to assess for breast cancer given patient's family history of breast cancer

## 2022-04-22 NOTE — Assessment & Plan Note (Addendum)
We will start the patient on phentermine 15 mg daily today Encouraged to follow-up in 1 month to uptitrate the dose Encouraged to continue lifestyle modification with a heart healthy diet and increase physical activities Wt Readings from Last 3 Encounters:  04/22/22 (!) 382 lb 0.6 oz (173.3 kg)  03/11/22 (!) 387 lb 0.6 oz (175.6 kg)  02/10/22 (!) 381 lb (172.8 kg)

## 2022-04-22 NOTE — Patient Instructions (Signed)
I appreciate the opportunity to provide care to you today!    Follow up:  1 months  Please pick up your medication at the pharmacy   Please continue to a heart-healthy diet and increase your physical activities. Try to exercise for 2mns at least three times a week.      It was a pleasure to see you and I look forward to continuing to work together on your health and well-being. Please do not hesitate to call the office if you need care or have questions about your care.   Have a wonderful day and week. With Gratitude, GAlvira MondayMSN, FNP-BC

## 2022-04-22 NOTE — Progress Notes (Addendum)
Established Patient Office Visit  Subjective:  Patient ID: [name], female    DOB: [birth year]  Age: 36 y.o. MRN: [id number]  CC:  Chief Complaint  Patient presents with   Obesity    1 month f/u. Wegovy not in stock needs alternative until medication in stock.     HPI [name] is a 36 y.o. female with past medical history of essential hypertension, morbid obesity presents for f/u of  chronic medical conditions.  Obesity: The patient has lost 5 pounds since 03/11/2022.  She reports lifestyle modification and would like to be started on oral obesity treatment for obesity today.  She denies any history of cardiovascular disease, hypothyroidism and glaucoma.  Family history of breast cancer: She reports a maternal family history of breast cancer, stating that her mother had breast cancer with a mastectomy of one of her breasts, and her maternal mother is a breast cancer survivor.  She denies current symptoms of breast cancer but would like to start early screening for breast cancer.   Past Medical History:  Diagnosis Date   No pertinent past medical history    Postpartum pain 06/30/2011   Vaginal delivery, moderate bleeding, tender in abd, esp LLQ    Past Surgical History:  Procedure Laterality Date   DILATION AND CURETTAGE OF UTERUS     VAGINAL DELIVERY  06/30/11   Womens    Family History  Problem Relation Age of Onset   Anesthesia problems Neg Hx    Hypotension Neg Hx    Malignant hyperthermia Neg Hx    Pseudochol deficiency Neg Hx     Social History   Socioeconomic History   Marital status: Single    Spouse name: Not on file   Number of children: Not on file   Years of education: Not on file   Highest education level: Not on file  Occupational History   Not on file  Tobacco Use   Smoking status: Former    Packs/day: 0.25    Years: 1.00    Total pack years: 0.25    Types: Cigarettes   Smokeless tobacco: Never  Substance and Sexual Activity    Alcohol use: No   Drug use: No   Sexual activity: Not Currently    Birth control/protection: None    Comment: pt is currently pregnant  Other Topics Concern   Not on file  Social History Narrative   Not on file   Social Determinants of Health   Financial Resource Strain: Not on file  Food Insecurity: Not on file  Transportation Needs: Not on file  Physical Activity: Not on file  Stress: Not on file  Social Connections: Not on file  Intimate Partner Violence: Not on file    Outpatient Medications Prior to Visit  Medication Sig Dispense Refill   telmisartan-hydrochlorothiazide (MICARDIS HCT) 40-12.5 MG tablet Take 1 tablet by mouth daily. 150 tablet 0   Vitamin D, Ergocalciferol, (DRISDOL) 1.25 MG (50000 UNIT) CAPS capsule Take 1 capsule (50,000 Units total) by mouth every 7 (seven) days. 13 capsule 0   Semaglutide-Weight Management (WEGOVY) 0.25 MG/0.5ML SOAJ Inject 0.25 mg into the skin once a week. (Patient not taking: Reported on 04/22/2022) 2 mL 0   No facility-administered medications prior to visit.    Not on File  ROS Review of Systems  Constitutional:  Negative for fatigue and fever.  Eyes:  Negative for visual disturbance.  Respiratory:  Negative for choking and shortness of breath.   Cardiovascular:  Negative for chest pain and palpitations.  Endocrine: Negative for polyphagia and polyuria.  Neurological:  Negative for dizziness and headaches.      Objective:    Physical Exam Constitutional:      Appearance: She is obese.  HENT:     Head: Normocephalic.  Cardiovascular:     Rate and Rhythm: Normal rate and regular rhythm.     Pulses: Normal pulses.     Heart sounds: Normal heart sounds.  Neurological:     Mental Status: She is alert.     BP 138/88 (BP Location: Left Arm)   Pulse 76   Ht _0  (1.676 m)   Wt (!) 382 lb 0.6 oz (173.3 kg)   SpO2 96%   BMI 61.66 kg/m  Wt Readings from Last 3 Encounters:  04/22/22 (!) 382 lb 0.6 oz (173.3 kg)   03/11/22 (!) 387 lb 0.6 oz (175.6 kg)  02/10/22 (!) 381 lb (172.8 kg)    Lab Results  Component Value Date   TSH 1.430 01/11/2022   Lab Results  Component Value Date   WBC 5.8 01/11/2022   HGB 11.6 01/11/2022   HCT 35.6 01/11/2022   MCV 87 01/11/2022   PLT 219 01/11/2022   Lab Results  Component Value Date   NA 137 01/11/2022   K 4.7 01/11/2022   CO2 21 01/11/2022   GLUCOSE 96 01/11/2022   BUN 9 01/11/2022   CREATININE 0.72 01/11/2022   BILITOT 0.3 01/11/2022   ALKPHOS 60 01/11/2022   AST 12 01/11/2022   ALT 13 01/11/2022   PROT 6.6 01/11/2022   ALBUMIN 3.6 (L) 01/11/2022   CALCIUM 9.8 01/11/2022   EGFR 111 01/11/2022   Lab Results  Component Value Date   CHOL 181 01/11/2022   Lab Results  Component Value Date   HDL 55 01/11/2022   Lab Results  Component Value Date   LDLCALC 98 01/11/2022   Lab Results  Component Value Date   TRIG 165 (H) 01/11/2022   Lab Results  Component Value Date   CHOLHDL 3.3 01/11/2022   Lab Results  Component Value Date   HGBA1C 5.5 01/11/2022      Assessment & Plan:   Problem List Items Addressed This Visit       Other   Morbid obesity ([place]) - Primary    We will start the patient on phentermine 15 mg daily today Encouraged to follow-up in 1 month to uptitrate the dose Encouraged to continue lifestyle modification with a heart healthy diet and increase physical activities Wt Readings from Last 3 Encounters:  04/22/22 (!) 382 lb 0.6 oz (173.3 kg)  03/11/22 (!) 387 lb 0.6 oz (175.6 kg)  02/10/22 (!) 381 lb (172.8 kg)          Relevant Medications   phentermine 15 MG capsule   Family history of breast cancer     Will order imaging studies to assess for breast cancer given patient's family history of breast cancer      Relevant Orders   MM 3D SCREEN BREAST BILATERAL    Meds ordered this encounter  Medications   phentermine 15 MG capsule    Sig: Take 1 capsule (15 mg total) by mouth every morning.     Dispense:  30 capsule    Refill:  0    Follow-up: Return in about 1 month (around 05/22/2022).    Alvira Monday, FNP

## 2022-04-22 NOTE — Addendum Note (Signed)
Addended by: Bolivar Haw on: 04/22/2022 03:28 PM   Modules accepted: Orders

## 2022-04-22 NOTE — Addendum Note (Signed)
Addended byAlvira Monday on: 04/22/2022 05:33 PM   Modules accepted: Level of Service

## 2022-05-06 ENCOUNTER — Encounter (HOSPITAL_COMMUNITY): Payer: Self-pay

## 2022-05-06 ENCOUNTER — Other Ambulatory Visit (HOSPITAL_COMMUNITY): Payer: Self-pay

## 2022-05-06 ENCOUNTER — Ambulatory Visit (HOSPITAL_COMMUNITY)
Admission: RE | Admit: 2022-05-06 | Discharge: 2022-05-06 | Disposition: A | Discharge status: Home / Self Care | Payer: 59 | Source: Ambulatory Visit | Attending: Family Medicine | Admitting: Family Medicine

## 2022-05-06 DIAGNOSIS — Z1231 Encounter for screening mammogram for malignant neoplasm of breast: Secondary | ICD-10-CM | POA: Diagnosis not present

## 2022-05-06 DIAGNOSIS — Z803 Family history of malignant neoplasm of breast: Secondary | ICD-10-CM | POA: Diagnosis present

## 2022-05-10 ENCOUNTER — Other Ambulatory Visit (HOSPITAL_COMMUNITY): Payer: Self-pay | Admitting: Family Medicine

## 2022-05-10 DIAGNOSIS — R928 Other abnormal and inconclusive findings on diagnostic imaging of breast: Secondary | ICD-10-CM

## 2022-05-11 ENCOUNTER — Other Ambulatory Visit (HOSPITAL_COMMUNITY): Payer: Self-pay

## 2022-05-11 ENCOUNTER — Ambulatory Visit (HOSPITAL_COMMUNITY)
Admission: RE | Admit: 2022-05-11 | Discharge: 2022-05-11 | Disposition: A | Discharge status: Home / Self Care | Payer: 59 | Source: Ambulatory Visit | Attending: Family Medicine | Admitting: Family Medicine

## 2022-05-11 DIAGNOSIS — R928 Other abnormal and inconclusive findings on diagnostic imaging of breast: Secondary | ICD-10-CM

## 2022-05-12 ENCOUNTER — Other Ambulatory Visit: Payer: Self-pay | Admitting: Family Medicine

## 2022-05-12 DIAGNOSIS — R928 Other abnormal and inconclusive findings on diagnostic imaging of breast: Secondary | ICD-10-CM

## 2022-05-13 ENCOUNTER — Other Ambulatory Visit (HOSPITAL_COMMUNITY): Payer: Self-pay

## 2022-05-20 ENCOUNTER — Ambulatory Visit
Admission: RE | Admit: 2022-05-20 | Discharge: 2022-05-20 | Disposition: A | Discharge status: Home / Self Care | Payer: 59 | Source: Ambulatory Visit | Attending: Family Medicine | Admitting: Family Medicine

## 2022-05-20 DIAGNOSIS — R928 Other abnormal and inconclusive findings on diagnostic imaging of breast: Secondary | ICD-10-CM

## 2022-05-20 HISTORY — PX: BREAST BIOPSY: SHX20

## 2022-05-23 ENCOUNTER — Encounter: Payer: Self-pay | Admitting: Family Medicine

## 2022-05-23 ENCOUNTER — Ambulatory Visit: Payer: 59 | Admitting: Family Medicine

## 2022-05-23 ENCOUNTER — Other Ambulatory Visit (HOSPITAL_COMMUNITY): Payer: Self-pay

## 2022-05-23 DIAGNOSIS — R1084 Generalized abdominal pain: Secondary | ICD-10-CM | POA: Insufficient documentation

## 2022-05-23 DIAGNOSIS — R1013 Epigastric pain: Secondary | ICD-10-CM | POA: Diagnosis not present

## 2022-05-23 MED ORDER — PANTOPRAZOLE SODIUM 20 MG PO TBEC
20.0000 mg | DELAYED_RELEASE_TABLET | Freq: Every day | ORAL | 0 refills | Status: DC
  Filled 2022-05-23: qty 30, 30d supply, fill #0

## 2022-05-23 MED ORDER — PHENTERMINE HCL 30 MG PO CAPS
30.0000 mg | ORAL_CAPSULE | ORAL | 0 refills | Status: DC
  Filled 2022-05-23: qty 30, 30d supply, fill #0

## 2022-05-23 NOTE — Progress Notes (Signed)
Established Patient Office Visit  Subjective:  Patient ID: [name], female    DOB: 09-09-1985  Age: 36 y.o. MRN: [id number]  CC:  Chief Complaint  Patient presents with   Follow-up    1 month f/u, pt reports medication not helping much, states she works 3rd shift.   Abdominal Pain    Pt reports sharp pain in her abdomen, worsens when she drinks alcohol.     HPI [name] is a 36 y.o. female with past medical history of obesity, essential hypertension, cystitis, bilateral knee pain presents for f/u of  chronic medical conditions.  Obesity: She takes phentermine 15 mg daily and reports minimal weight loss since starting therapy.  She reports walking frequently at work.  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  She has recently picked up more hours and reports working 5(12-hour) shifts. She reports that she has been decreasing her carbs intake and increasing her fluid consumption.   Abdominal pain: She complains of a sharp, nonradiating epigastric pain that intensifies after drinking alcohol.  She denies nausea, vomiting, fever, chills, changes in her bowel or bladder.  She denies symptoms of urinary tract infection.   Past Medical History:  Diagnosis Date   No pertinent past medical history    Postpartum pain 06/30/2011   Vaginal delivery, moderate bleeding, tender in abd, esp LLQ    Past Surgical History:  Procedure Laterality Date   BREAST BIOPSY Right 05/20/2022   MM RT BREAST BX W LOC DEV 1ST LESION IMAGE BX SPEC STEREO GUIDE 05/20/2022 [facility]   DILATION AND CURETTAGE OF UTERUS     VAGINAL DELIVERY  06/30/11   Womens    Family History  Problem Relation Age of Onset   Breast cancer Mother    Breast cancer Maternal Grandmother    Anesthesia problems Neg Hx    Hypotension Neg Hx    Malignant hyperthermia Neg Hx    Pseudochol deficiency Neg Hx     Social History   Socioeconomic History   Marital  status: Single    Spouse name: Not on file   Number of children: Not on file   Years of education: Not on file   Highest education level: Not on file  Occupational History   Not on file  Tobacco Use   Smoking status: Former    Packs/day: 0.25    Years: 1.00    Total pack years: 0.25    Types: Cigarettes   Smokeless tobacco: Never  Substance and Sexual Activity   Alcohol use: No   Drug use: No   Sexual activity: Not Currently    Birth control/protection: None    Comment: pt is currently pregnant  Other Topics Concern   Not on file  Social History Narrative   Not on file   Social Determinants of Health   Financial Resource Strain: Not on file  Food Insecurity: Not on file  Transportation Needs: Not on file  Physical Activity: Not on file  Stress: Not on file  Social Connections: Not on file  Intimate Partner Violence: Not on file    Outpatient Medications Prior to Visit  Medication Sig Dispense Refill   telmisartan-hydrochlorothiazide (MICARDIS HCT) 40-12.5 MG tablet Take 1 tablet by mouth daily. 150 tablet 0   Vitamin D, Ergocalciferol, (DRISDOL) 1.25 MG (50000 UNIT) CAPS capsule Take 1 capsule (50,000 Units total) by mouth every 7 (seven) days. 13 capsule 0   phentermine  15 MG capsule Take 1 capsule (15 mg total) by mouth every morning. 30 capsule 0   Semaglutide-Weight Management (WEGOVY) 0.25 MG/0.5ML SOAJ Inject 0.25 mg into the skin once a week. (Patient not taking: Reported on 04/22/2022) 2 mL 0   No facility-administered medications prior to visit.    Not on File  ROS Review of Systems  Constitutional:  Negative for chills and fever.  Eyes:  Negative for visual disturbance.  Respiratory:  Negative for chest tightness and shortness of breath.   Gastrointestinal:  Positive for abdominal pain. Negative for blood in stool, constipation, diarrhea, nausea and vomiting.  Genitourinary:  Negative for dysuria, flank pain, frequency and urgency.  Neurological:   Negative for dizziness and headaches.      Objective:    Physical Exam HENT:     Head: Normocephalic.     Mouth/Throat:     Mouth: Mucous membranes are moist.  Cardiovascular:     Rate and Rhythm: Normal rate.     Heart sounds: Normal heart sounds.  Pulmonary:     Effort: Pulmonary effort is normal.     Breath sounds: Normal breath sounds.  Abdominal:     General: Abdomen is flat. Bowel sounds are normal.     Tenderness: There is no abdominal tenderness. There is no right CVA tenderness or left CVA tenderness.  Neurological:     Mental Status: She is alert.     BP 137/71   Pulse 83   Ht _0  (1.676 m)   Wt (!) 383 lb 1.9 oz (173.8 kg)   LMP 05/06/2022   SpO2 98%   BMI 61.84 kg/m  Wt Readings from Last 3 Encounters:  05/23/22 (!) 383 lb 1.9 oz (173.8 kg)  04/22/22 (!) 382 lb 0.6 oz (173.3 kg)  03/11/22 (!) 387 lb 0.6 oz (175.6 kg)    Lab Results  Component Value Date   TSH 1.430 01/11/2022   Lab Results  Component Value Date   WBC 5.8 01/11/2022   HGB 11.6 01/11/2022   HCT 35.6 01/11/2022   MCV 87 01/11/2022   PLT 219 01/11/2022   Lab Results  Component Value Date   NA 137 01/11/2022   K 4.7 01/11/2022   CO2 21 01/11/2022   GLUCOSE 96 01/11/2022   BUN 9 01/11/2022   CREATININE 0.72 01/11/2022   BILITOT 0.3 01/11/2022   ALKPHOS 60 01/11/2022   AST 12 01/11/2022   ALT 13 01/11/2022   PROT 6.6 01/11/2022   ALBUMIN 3.6 (L) 01/11/2022   CALCIUM 9.8 01/11/2022   EGFR 111 01/11/2022   Lab Results  Component Value Date   CHOL 181 01/11/2022   Lab Results  Component Value Date   HDL 55 01/11/2022   Lab Results  Component Value Date   LDLCALC 98 01/11/2022   Lab Results  Component Value Date   TRIG 165 (H) 01/11/2022   Lab Results  Component Value Date   CHOLHDL 3.3 01/11/2022   Lab Results  Component Value Date   HGBA1C 5.5 01/11/2022      Assessment & Plan:  Morbid obesity ([place]) Assessment & Plan: Will increase phentermine to 30  mg daily today Encouraged to increase her physical activities, with a heart healthy diet We will reconvene in 1 month Wt Readings from Last 3 Encounters:  05/23/22 (!) 383 lb 1.9 oz (173.8 kg)  04/22/22 (!) 382 lb 0.6 oz (173.3 kg)  03/11/22 (!) 387 lb 0.6 oz (175.6 kg)     Orders: -  Phentermine HCl; Take 1 capsule (30 mg total) by mouth every morning.  Dispense: 30 capsule; Refill: 0  Epigastric abdominal pain Assessment & Plan: Epigastric pain likely due to alcohol consumption Will start a trial of Protonix for 1 month and follow-up Encouraged to avoid GERD producing foods Avoid certain foods and drinks, such as coffee, chocolate, onions, peppermint, spicy foods, carbonated beverages, citrus fruits, tomatoes, onions, Garlic, alcohol, Fatty foods (bacon, burgers, sausages, steak, fried foods, dairy food)    Recommended: High fiber foods, whole grain cereal, oatmeal, brown rice, root vegetables, non- citrus fruits, High protein foods, Health fats (avocados, olive oil, nuts and seeds)  Orders: -     Pantoprazole Sodium; Take 1 tablet (20 mg total) by mouth daily.  Dispense: 30 tablet; Refill: 0    Follow-up: Return in about 1 month (around 06/23/2022) for obesity.   Alvira Monday, FNP

## 2022-05-23 NOTE — Patient Instructions (Signed)
I appreciate the opportunity to provide care to you today!    Follow up:  1 months   Please pick up your medications at the pharmacy  Lifestyle changes for GERD:  Avoid certain foods and drinks, such as coffee, chocolate, onions, peppermint, spicy foods, carbonated beverages, citrus fruits, tomatoes, onions, Garlic, alcohol, Fatty foods (bacon, burgers, sausages, steak, fried foods, dairy food)    Recommended: High fiber foods, whole grain cereal, oatmeal, brown rice, root vegetables, non- citrus fruits, High protein foods, Health fats (avocados, olive oil, nuts and seeds)    Please continue to a heart-healthy diet and increase your physical activities. Try to exercise for 58mns at least three times a week.      It was a pleasure to see you and I look forward to continuing to work together on your health and well-being. Please do not hesitate to call the office if you need care or have questions about your care.   Have a wonderful day and week. With Gratitude, GAlvira MondayMSN, FNP-BC

## 2022-05-23 NOTE — Assessment & Plan Note (Signed)
Epigastric pain likely due to alcohol consumption Will start a trial of Protonix for 1 month and follow-up Encouraged to avoid GERD producing foods Avoid certain foods and drinks, such as coffee, chocolate, onions, peppermint, spicy foods, carbonated beverages, citrus fruits, tomatoes, onions, Garlic, alcohol, Fatty foods (bacon, burgers, sausages, steak, fried foods, dairy food)    Recommended: High fiber foods, whole grain cereal, oatmeal, brown rice, root vegetables, non- citrus fruits, High protein foods, Health fats (avocados, olive oil, nuts and seeds)

## 2022-05-23 NOTE — Assessment & Plan Note (Signed)
Will increase phentermine to 30 mg daily today Encouraged to increase her physical activities, with a heart healthy diet We will reconvene in 1 month Wt Readings from Last 3 Encounters:  05/23/22 (!) 383 lb 1.9 oz (173.8 kg)  04/22/22 (!) 382 lb 0.6 oz (173.3 kg)  03/11/22 (!) 387 lb 0.6 oz (175.6 kg)

## 2022-05-25 ENCOUNTER — Other Ambulatory Visit (HOSPITAL_COMMUNITY): Payer: Self-pay

## 2022-06-09 ENCOUNTER — Other Ambulatory Visit: Payer: Self-pay | Admitting: Family Medicine

## 2022-06-09 DIAGNOSIS — E559 Vitamin D deficiency, unspecified: Secondary | ICD-10-CM

## 2022-06-15 ENCOUNTER — Ambulatory Visit: Payer: Self-pay | Admitting: General Surgery

## 2022-06-15 DIAGNOSIS — D241 Benign neoplasm of right breast: Secondary | ICD-10-CM | POA: Diagnosis not present

## 2022-06-21 ENCOUNTER — Other Ambulatory Visit: Payer: Self-pay | Admitting: General Surgery

## 2022-06-21 DIAGNOSIS — D241 Benign neoplasm of right breast: Secondary | ICD-10-CM

## 2022-06-24 ENCOUNTER — Other Ambulatory Visit: Payer: Self-pay | Admitting: Family Medicine

## 2022-06-24 ENCOUNTER — Ambulatory Visit (INDEPENDENT_AMBULATORY_CARE_PROVIDER_SITE_OTHER): Payer: 59 | Admitting: Family Medicine

## 2022-06-24 ENCOUNTER — Other Ambulatory Visit (HOSPITAL_COMMUNITY): Payer: Self-pay

## 2022-06-24 ENCOUNTER — Other Ambulatory Visit: Payer: Self-pay

## 2022-06-24 ENCOUNTER — Encounter: Payer: Self-pay | Admitting: Family Medicine

## 2022-06-24 VITALS — BP 119/77 | HR 84 | Ht 67.0 in | Wt 379.0 lb

## 2022-06-24 DIAGNOSIS — R1010 Upper abdominal pain, unspecified: Secondary | ICD-10-CM | POA: Insufficient documentation

## 2022-06-24 DIAGNOSIS — E559 Vitamin D deficiency, unspecified: Secondary | ICD-10-CM

## 2022-06-24 DIAGNOSIS — K219 Gastro-esophageal reflux disease without esophagitis: Secondary | ICD-10-CM | POA: Diagnosis not present

## 2022-06-24 DIAGNOSIS — R109 Unspecified abdominal pain: Secondary | ICD-10-CM | POA: Insufficient documentation

## 2022-06-24 DIAGNOSIS — R1011 Right upper quadrant pain: Secondary | ICD-10-CM

## 2022-06-24 MED ORDER — PANTOPRAZOLE SODIUM 40 MG PO TBEC
40.0000 mg | DELAYED_RELEASE_TABLET | Freq: Every day | ORAL | 2 refills | Status: DC
  Filled 2022-06-24: qty 30, 30d supply, fill #0
  Filled 2022-09-03: qty 30, 30d supply, fill #1
  Filled 2022-10-24: qty 30, 30d supply, fill #2

## 2022-06-24 MED ORDER — PHENTERMINE-TOPIRAMATE ER 3.75-23 MG PO CP24
1.0000 | ORAL_CAPSULE | Freq: Every day | ORAL | 0 refills | Status: DC
  Filled 2022-06-24: qty 30, 30d supply, fill #0

## 2022-06-24 NOTE — Progress Notes (Signed)
Established Patient Office Visit  Subjective:  Patient ID: [name], female    DOB: 1985/10/13  Age: 37 y.o. MRN: [id number]  CC:  Chief Complaint  Patient presents with   Follow-up    1 month f/u for weight loss, needs a pap scheduled, will do at next f/u if ok.      HPI [name] is a 37 y.o. female with past medical history of obesity and GERD presents for f/u of  chronic medical conditions. For the details of today's visit, please refer to the assessment and plan.    Past Medical History:  Diagnosis Date   No pertinent past medical history    Postpartum pain 06/30/2011   Vaginal delivery, moderate bleeding, tender in abd, esp LLQ    Past Surgical History:  Procedure Laterality Date   BREAST BIOPSY Right 05/20/2022   MM RT BREAST BX W LOC DEV 1ST LESION IMAGE BX SPEC STEREO GUIDE 05/20/2022 [facility]   DILATION AND CURETTAGE OF UTERUS     VAGINAL DELIVERY  06/30/11   [facility]    Family History  Problem Relation Age of Onset   Breast cancer Mother    Breast cancer Maternal Grandmother    Anesthesia problems Neg Hx    Hypotension Neg Hx    Malignant hyperthermia Neg Hx    Pseudochol deficiency Neg Hx     Social History   Socioeconomic History   Marital status: Single    Spouse name: Not on file   Number of children: Not on file   Years of education: Not on file   Highest education level: Not on file  Occupational History   Not on file  Tobacco Use   Smoking status: Former    Packs/day: 0.25    Years: 1.00    Total pack years: 0.25    Types: Cigarettes   Smokeless tobacco: Never  Substance and Sexual Activity   Alcohol use: No   Drug use: No   Sexual activity: Not Currently    Birth control/protection: None    Comment: pt is currently pregnant  Other Topics Concern   Not on file  Social History Narrative   Not on file   Social Determinants of Health   Financial Resource Strain: Not on file  Food Insecurity: Not on file   Transportation Needs: Not on file  Physical Activity: Not on file  Stress: Not on file  Social Connections: Not on file  Intimate Partner Violence: Not on file    Outpatient Medications Prior to Visit  Medication Sig Dispense Refill   telmisartan-hydrochlorothiazide (MICARDIS HCT) 40-12.5 MG tablet Take 1 tablet by mouth daily. 150 tablet 0   Vitamin D, Ergocalciferol, (DRISDOL) 1.25 MG (50000 UNIT) CAPS capsule TAKE 1 CAPSULE BY MOUTH EVERY 7 DAYS 13 capsule 0   pantoprazole (PROTONIX) 20 MG tablet Take 1 tablet (20 mg total) by mouth daily. 30 tablet 0   phentermine 30 MG capsule Take 1 capsule (30 mg total) by mouth every morning. 30 capsule 0   No facility-administered medications prior to visit.    Not on File  ROS Review of Systems  Constitutional:  Negative for chills and fever.  Eyes:  Negative for visual disturbance.  Respiratory:  Negative for chest tightness and shortness of breath.   Neurological:  Negative for dizziness and headaches.      Objective:    Physical Exam HENT:     Head: Normocephalic.     Mouth/Throat:  Mouth: Mucous membranes are moist.  Cardiovascular:     Rate and Rhythm: Normal rate.     Heart sounds: Normal heart sounds.  Pulmonary:     Effort: Pulmonary effort is normal.     Breath sounds: Normal breath sounds.  Neurological:     Mental Status: She is alert.     BP 119/77   Pulse 84   Ht '5\' 7"'$  (1.702 m)   Wt (!) 379 lb 0.6 oz (171.9 kg)   SpO2 96%   BMI 59.37 kg/m  Wt Readings from Last 3 Encounters:  06/24/22 (!) 379 lb 0.6 oz (171.9 kg)  05/23/22 (!) 383 lb 1.9 oz (173.8 kg)  04/22/22 (!) 382 lb 0.6 oz (173.3 kg)    Lab Results  Component Value Date   TSH 1.430 01/11/2022   Lab Results  Component Value Date   WBC 5.8 01/11/2022   HGB 11.6 01/11/2022   HCT 35.6 01/11/2022   MCV 87 01/11/2022   PLT 219 01/11/2022   Lab Results  Component Value Date   NA 137 01/11/2022   K 4.7 01/11/2022   CO2 21 01/11/2022    GLUCOSE 96 01/11/2022   BUN 9 01/11/2022   CREATININE 0.72 01/11/2022   BILITOT 0.3 01/11/2022   ALKPHOS 60 01/11/2022   AST 12 01/11/2022   ALT 13 01/11/2022   PROT 6.6 01/11/2022   ALBUMIN 3.6 (L) 01/11/2022   CALCIUM 9.8 01/11/2022   EGFR 111 01/11/2022   Lab Results  Component Value Date   CHOL 181 01/11/2022   Lab Results  Component Value Date   HDL 55 01/11/2022   Lab Results  Component Value Date   LDLCALC 98 01/11/2022   Lab Results  Component Value Date   TRIG 165 (H) 01/11/2022   Lab Results  Component Value Date   CHOLHDL 3.3 01/11/2022   Lab Results  Component Value Date   HGBA1C 5.5 01/11/2022      Assessment & Plan:  Right upper quadrant abdominal pain Assessment & Plan: She complains of right upper quadrant abdominal pain 3 days ago Pain is described as sharp radiating down her lower extremities Denies nausea, vomiting, and fever No pain with palpation, or guarding of the right upper quadrant Will get an ultrasound of the abdomen to exclude out hepatobiliary etiologies   Orders: -     US Abdomen Complete  Morbid obesity ([place]) Assessment & Plan: The patient has lost 4 pounds since starting phentermine 30 mg daily Will increase the dose today to 37.5 mg and encouraged to continue  therapy Encouraged to increase her physical activities with a heart healthy diet  Orders: -     Phentermine-Topiramate; Take 1 tablet by mouth daily.  Dispense: 30 capsule; Refill: 0  Gastroesophageal reflux disease without esophagitis -     Pantoprazole Sodium; Take 1 tablet (40 mg total) by mouth daily.  Dispense: 30 tablet; Refill: 2    Follow-up: Return in about 1 month (around 07/25/2022) for pap smear.   Alvira Monday, FNP

## 2022-06-24 NOTE — Patient Instructions (Addendum)
I appreciate the opportunity to provide care to you today!    Follow up:  1 months Pap smear examination  Please stop by [facility] hospital anytime to get an ultrasound of the abdomen  Please pick up your medications at the pharmacy   Please continue to a heart-healthy diet and increase your physical activities. Try to exercise for 80mns at least five times a week.   Physical activity helps: Lower your blood glucose, improve your heart health, lower your blood pressure and cholesterol, burn calories to help manage her weight, gave you energy, lower stress, and improve his sleep.  The American diabetes Association (ADA) recommends being active for 2-1/2 hours (150 minutes) or more week.  Exercise for 30 minutes, 5 days a week (150 minutes total)      It was a pleasure to see you and I look forward to continuing to work together on your health and well-being. Please do not hesitate to call the office if you need care or have questions about your care.   Have a wonderful day and week. With Gratitude, GAlvira MondayMSN, FNP-BC

## 2022-06-24 NOTE — Assessment & Plan Note (Signed)
She complains of right upper quadrant abdominal pain 3 days ago Pain is described as sharp radiating down her lower extremities Denies nausea, vomiting, and fever No pain with palpation, or guarding of the right upper quadrant Will get an ultrasound of the abdomen to exclude out hepatobiliary etiologies

## 2022-06-24 NOTE — Assessment & Plan Note (Signed)
The patient has lost 4 pounds since starting phentermine 30 mg daily Will increase the dose today to 37.5 mg and encouraged to continue  therapy Encouraged to increase her physical activities with a heart healthy diet

## 2022-06-25 ENCOUNTER — Other Ambulatory Visit: Payer: Self-pay

## 2022-06-27 ENCOUNTER — Other Ambulatory Visit: Payer: Self-pay

## 2022-06-29 ENCOUNTER — Other Ambulatory Visit (HOSPITAL_COMMUNITY): Payer: Self-pay

## 2022-07-05 ENCOUNTER — Other Ambulatory Visit (HOSPITAL_COMMUNITY): Payer: Self-pay

## 2022-07-06 ENCOUNTER — Encounter: Payer: Self-pay | Admitting: Family Medicine

## 2022-07-06 ENCOUNTER — Ambulatory Visit (HOSPITAL_COMMUNITY)
Admission: RE | Admit: 2022-07-06 | Discharge: 2022-07-06 | Disposition: A | Discharge status: Home / Self Care | Payer: 59 | Source: Ambulatory Visit | Attending: Family Medicine | Admitting: Family Medicine

## 2022-07-06 DIAGNOSIS — K7689 Other specified diseases of liver: Secondary | ICD-10-CM | POA: Diagnosis not present

## 2022-07-06 DIAGNOSIS — K828 Other specified diseases of gallbladder: Secondary | ICD-10-CM | POA: Diagnosis not present

## 2022-07-06 DIAGNOSIS — R1011 Right upper quadrant pain: Secondary | ICD-10-CM | POA: Insufficient documentation

## 2022-07-09 ENCOUNTER — Other Ambulatory Visit (HOSPITAL_COMMUNITY): Payer: Self-pay

## 2022-07-11 ENCOUNTER — Encounter (HOSPITAL_COMMUNITY): Payer: Self-pay

## 2022-07-11 ENCOUNTER — Other Ambulatory Visit: Payer: Self-pay

## 2022-07-11 NOTE — Progress Notes (Signed)
Surgical Instructions    Your procedure is scheduled on Monday, July 18, 2022.  Report to [facility] Main Entrance "A" at 6:30 A.M., then check in with the Admitting office.  Call this number if you have problems the morning of surgery:  [phone]   If you have any questions prior to your surgery date call [phone]: Open Monday-Friday 8am-4pm If you experience any cold or flu symptoms such as cough, fever, chills, shortness of breath, etc. between now and your scheduled surgery, please notify us at the above number     Remember:  Do not eat after midnight the night before your surgery  You may drink clear liquids until 5:30 the morning of your surgery.   Clear liquids allowed are: Water, Non-Citrus Juices (without pulp), Carbonated Beverages, Clear Tea, Black Coffee ONLY (NO MILK, CREAM OR POWDERED CREAMER of any kind), and Gatorade    Take these medicines the morning of surgery with A SIP OF WATER:   pantoprazole (PROTONIX)    Stop taking Phentermine-Topiramate two weeks prior to surgeryor as soon as possible.   As of today, STOP taking any Aspirin (unless otherwise instructed by your surgeon) Aleve, Naproxen, Ibuprofen, Motrin, Advil, Goody's, BC's, all herbal medications, fish oil, and all vitamins.           Do not wear jewelry or makeup. Do not wear lotions, powders, perfumes/cologne or deodorant. Do not shave 48 hours prior to surgery.   Do not bring valuables to the hospital. Do not wear nail polish, gel polish, artificial nails, or any other type of covering on natural nails (fingers and toes) If you have artificial nails or gel coating that need to be removed by a nail salon, please have this removed prior to surgery. Artificial nails or gel coating may interfere with anesthesia's ability to adequately monitor your vital signs.  [facility] is not responsible for any belongings or valuables.    Do NOT Smoke (Tobacco/Vaping)  24 hours prior to your  procedure  If you use a CPAP at night, you may bring your mask for your overnight stay.   Contacts, glasses, hearing aids, dentures or partials may not be worn into surgery, please bring cases for these belongings   For patients admitted to the hospital, discharge time will be determined by your treatment team.   Patients discharged the day of surgery will not be allowed to drive home, and someone needs to stay with them for 24 hours.   SURGICAL WAITING ROOM VISITATION Patients having surgery or a procedure may have no more than 2 support people in the waiting area - these visitors may rotate.   Children under the age of 87 must have an adult with them who is not the patient. If the patient needs to stay at the hospital during part of their recovery, the visitor guidelines for inpatient rooms apply. Pre-op nurse will coordinate an appropriate time for 1 support person to accompany patient in pre-op.  This support person may not rotate.   Please refer to [email or web address] for the visitor guidelines for Inpatients (after your surgery is over and you are in a regular room).    Special instructions:    Oral Hygiene is also important to reduce your risk of infection.  Remember - BRUSH YOUR TEETH THE MORNING OF SURGERY WITH YOUR REGULAR TOOTHPASTE   [place]- Preparing For Surgery  Before surgery, you can play an important role. Because skin is not sterile, your skin needs to be as free  of germs as possible. You can reduce the number of germs on your skin by washing with CHG (chlorahexidine gluconate) Soap before surgery.  CHG is an antiseptic cleaner which kills germs and bonds with the skin to continue killing germs even after washing.     Please do not use if you have an allergy to CHG or antibacterial soaps. If your skin becomes reddened/irritated stop using the CHG.  Do not shave (including legs and underarms) for at least 48 hours  prior to first CHG shower. It is OK to shave your face.  Please follow these instructions carefully.     Shower the NIGHT BEFORE SURGERY and the MORNING OF SURGERY with CHG Soap.   If you chose to wash your hair, wash your hair first as usual with your normal shampoo. After you shampoo, rinse your hair and body thoroughly to remove the shampoo.  Then [employer] and genitals (private parts) with your normal soap and rinse thoroughly to remove soap.  After that Use CHG Soap as you would any other liquid soap. You can apply CHG directly to the skin and wash gently with a scrungie or a clean washcloth.   Apply the CHG Soap to your body ONLY FROM THE NECK DOWN.  Do not use on open wounds or open sores. Avoid contact with your eyes, ears, mouth and genitals (private parts). Wash Face and genitals (private parts)  with your normal soap.   Wash thoroughly, paying special attention to the area where your surgery will be performed.  Thoroughly rinse your body with warm water from the neck down.  DO NOT shower/wash with your normal soap after using and rinsing off the CHG Soap.  Pat yourself dry with a CLEAN TOWEL.  Wear CLEAN PAJAMAS to bed the night before surgery  Place CLEAN SHEETS on your bed the night before your surgery  DO NOT SLEEP WITH PETS.   Day of Surgery:  Take a shower with CHG soap. Wear Clean/Comfortable clothing the morning of surgery Do not apply any deodorants/lotions.   Remember to brush your teeth WITH YOUR REGULAR TOOTHPASTE.    If you received a COVID test during your pre-op visit, it is requested that you wear a mask when out in public, stay away from anyone that may not be feeling well, and notify your surgeon if you develop symptoms. If you have been in contact with anyone that has tested positive in the last 10 days, please notify your surgeon.    Please read over the following fact sheets that you were given.

## 2022-07-12 ENCOUNTER — Encounter (HOSPITAL_COMMUNITY)
Admission: RE | Admit: 2022-07-12 | Discharge: 2022-07-12 | Disposition: A | Discharge status: Home / Self Care | Payer: 59 | Source: Ambulatory Visit | Attending: General Surgery | Admitting: General Surgery

## 2022-07-12 ENCOUNTER — Encounter (HOSPITAL_COMMUNITY): Payer: Self-pay

## 2022-07-12 ENCOUNTER — Other Ambulatory Visit: Payer: Self-pay

## 2022-07-12 VITALS — BP 126/74 | HR 72 | Temp 98.3°F | Resp 18 | Ht 67.0 in | Wt 383.3 lb

## 2022-07-12 DIAGNOSIS — Z01818 Encounter for other preprocedural examination: Secondary | ICD-10-CM

## 2022-07-12 HISTORY — DX: Essential (primary) hypertension: I10

## 2022-07-12 HISTORY — DX: Gastro-esophageal reflux disease without esophagitis: K21.9

## 2022-07-12 LAB — BASIC METABOLIC PANEL
Anion gap: 9 (ref 5–15)
BUN: 13 mg/dL (ref 6–20)
CO2: 24 mmol/L (ref 22–32)
Calcium: 9.7 mg/dL (ref 8.9–10.3)
Chloride: 103 mmol/L (ref 98–111)
Creatinine, Ser: 0.86 mg/dL (ref 0.44–1.00)
GFR, Estimated: >60 mL/min (ref >60)
Glucose, Bld: 100 mg/dL — ABNORMAL HIGH (ref 70–99)
Potassium: 4.3 mmol/L (ref 3.5–5.1)
Sodium: 136 mmol/L (ref 135–145)

## 2022-07-12 LAB — CBC
HCT: 40.7 % (ref 36.0–46.0)
Hemoglobin: 12.4 g/dL (ref 12.0–15.0)
MCH: 26.8 pg (ref 26.0–34.0)
MCHC: 30.5 g/dL (ref 30.0–36.0)
MCV: 87.9 fL (ref 80.0–100.0)
Platelets: 248 10*3/uL (ref 150–400)
RBC: 4.63 MIL/uL (ref 3.87–5.11)
RDW: 14.9 % (ref 11.5–15.5)
WBC: 7.2 10*3/uL (ref 4.0–10.5)
nRBC: 0 % (ref 0.0–0.2)

## 2022-07-12 LAB — POCT PREGNANCY, URINE: Preg Test, Ur: NEGATIVE

## 2022-07-12 NOTE — Progress Notes (Signed)
PCP - Alvira Monday FNP Cardiologist - denies  PPM/ICD - denies Device Orders -  Rep Notified -   Chest x-ray - 08/01/11 EKG - 07/12/22 Stress Test - denies ECHO - denies Cardiac Cath - denies  Sleep Study - denies CPAP - no  Fasting Blood Sugar - na Checks Blood Sugar _____ times a day  Last dose of GLP1 agonist- na  GLP1 instructions: na  Blood Thinner Instructions:na Aspirin Instructions:na  ERAS Protcol -clear liquids until 0530 PRE-SURGERY Ensure or G2- no  COVID TEST- na   Anesthesia review: yes-seed patient. Pt states she has not had phentermine since 06/22/22  Patient denies shortness of breath, fever, cough and chest pain at PAT appointment   All instructions explained to the patient, with a verbal understanding of the material. Patient agrees to go over the instructions while at home for a better understanding. Patient also instructed to wear a mask when out in public prior to surgery. The opportunity to ask questions was provided.

## 2022-07-14 ENCOUNTER — Other Ambulatory Visit: Payer: Self-pay

## 2022-07-14 NOTE — Progress Notes (Signed)
The organs in your abdomen were negative for inflammation and infections. You do have an increased buildup of fat in your liver. I recommend decreasing your intake of fatty, greasy foods.

## 2022-07-15 ENCOUNTER — Ambulatory Visit
Admission: RE | Admit: 2022-07-15 | Discharge: 2022-07-15 | Disposition: A | Discharge status: Home / Self Care | Payer: 59 | Source: Ambulatory Visit | Attending: General Surgery | Admitting: General Surgery

## 2022-07-15 ENCOUNTER — Other Ambulatory Visit (HOSPITAL_COMMUNITY): Payer: Self-pay

## 2022-07-15 ENCOUNTER — Other Ambulatory Visit: Payer: Self-pay | Admitting: Family Medicine

## 2022-07-15 ENCOUNTER — Other Ambulatory Visit: Payer: Self-pay

## 2022-07-15 DIAGNOSIS — D241 Benign neoplasm of right breast: Secondary | ICD-10-CM

## 2022-07-15 DIAGNOSIS — R928 Other abnormal and inconclusive findings on diagnostic imaging of breast: Secondary | ICD-10-CM | POA: Diagnosis not present

## 2022-07-15 HISTORY — PX: BREAST BIOPSY: SHX20

## 2022-07-15 MED ORDER — WEGOVY 0.25 MG/0.5ML ~~LOC~~ SOAJ
0.2500 mg | SUBCUTANEOUS | 0 refills | Status: DC
  Filled 2022-07-15 – 2022-07-28 (×4): qty 2, 28d supply, fill #0

## 2022-07-15 NOTE — Telephone Encounter (Signed)
A prescription for wegovy is sent to her pharmacy. Kindly advise the patient to use backup contraceptives while on therapy

## 2022-07-17 NOTE — Anesthesia Preprocedure Evaluation (Signed)
Anesthesia Evaluation  Patient identified by MRN, date of birth, ID band Patient awake    Reviewed: Allergy & Precautions, NPO status , Patient's Chart, lab work & pertinent test results  History of Anesthesia Complications Negative for: history of anesthetic complications  Airway Mallampati: I  TM Distance: >3 FB Neck ROM: Full    Dental  (+) Dental Advisory Given, Teeth Intact   Pulmonary Current Smoker and Patient abstained from smoking.   breath sounds clear to auscultation       Cardiovascular hypertension, Pt. on medications (-) angina  Rhythm:Regular Rate:Normal     Neuro/Psych negative neurological ROS     GI/Hepatic Neg liver ROS,GERD  Medicated and Controlled,,  Endo/Other    Morbid obesityWegovy: has not started BMI 60  Renal/GU negative Renal ROS     Musculoskeletal   Abdominal  (+) + obese  Peds  Hematology   Anesthesia Other Findings   Reproductive/Obstetrics                              Anesthesia Physical Anesthesia Plan  ASA: 3  Anesthesia Plan: General   Post-op Pain Management: Tylenol PO (pre-op)*   Induction: Intravenous  PONV Risk Score and Plan: 3 and Ondansetron, Dexamethasone and Scopolamine patch - Pre-op  Airway Management Planned: Oral ETT  Additional Equipment: None  Intra-op Plan:   Post-operative Plan: Extubation in OR  Informed Consent: I have reviewed the patients History and Physical, chart, labs and discussed the procedure including the risks, benefits and alternatives for the proposed anesthesia with the patient or authorized representative who has indicated his/her understanding and acceptance.     Dental advisory given  Plan Discussed with: CRNA and Surgeon  Anesthesia Plan Comments:         Anesthesia Quick Evaluation

## 2022-07-18 ENCOUNTER — Ambulatory Visit (HOSPITAL_COMMUNITY)
Admission: RE | Admit: 2022-07-18 | Discharge: 2022-07-18 | Disposition: A | Discharge status: Home / Self Care | Payer: 59 | Source: Ambulatory Visit | Attending: General Surgery | Admitting: General Surgery

## 2022-07-18 ENCOUNTER — Other Ambulatory Visit: Payer: Self-pay

## 2022-07-18 ENCOUNTER — Ambulatory Visit (HOSPITAL_BASED_OUTPATIENT_CLINIC_OR_DEPARTMENT_OTHER): Payer: 59 | Admitting: Physician Assistant

## 2022-07-18 ENCOUNTER — Ambulatory Visit (HOSPITAL_COMMUNITY): Payer: 59 | Admitting: Physician Assistant

## 2022-07-18 ENCOUNTER — Ambulatory Visit
Admission: RE | Admit: 2022-07-18 | Discharge: 2022-07-18 | Disposition: A | Discharge status: Home / Self Care | Payer: Commercial Managed Care - PPO | Source: Ambulatory Visit | Attending: General Surgery | Admitting: General Surgery

## 2022-07-18 ENCOUNTER — Encounter (HOSPITAL_COMMUNITY): Payer: Self-pay | Admitting: General Surgery

## 2022-07-18 ENCOUNTER — Other Ambulatory Visit (HOSPITAL_COMMUNITY): Payer: Self-pay

## 2022-07-18 ENCOUNTER — Encounter (HOSPITAL_COMMUNITY)
Admission: RE | Disposition: A | Discharge status: Home / Self Care | Payer: Self-pay | Source: Ambulatory Visit | Attending: General Surgery

## 2022-07-18 DIAGNOSIS — Z79899 Other long term (current) drug therapy: Secondary | ICD-10-CM | POA: Diagnosis not present

## 2022-07-18 DIAGNOSIS — D241 Benign neoplasm of right breast: Secondary | ICD-10-CM

## 2022-07-18 DIAGNOSIS — F17210 Nicotine dependence, cigarettes, uncomplicated: Secondary | ICD-10-CM | POA: Diagnosis not present

## 2022-07-18 DIAGNOSIS — K219 Gastro-esophageal reflux disease without esophagitis: Secondary | ICD-10-CM | POA: Insufficient documentation

## 2022-07-18 DIAGNOSIS — I10 Essential (primary) hypertension: Secondary | ICD-10-CM | POA: Diagnosis not present

## 2022-07-18 DIAGNOSIS — Z803 Family history of malignant neoplasm of breast: Secondary | ICD-10-CM | POA: Diagnosis not present

## 2022-07-18 DIAGNOSIS — N6021 Fibroadenosis of right breast: Secondary | ICD-10-CM | POA: Diagnosis not present

## 2022-07-18 DIAGNOSIS — N6081 Other benign mammary dysplasias of right breast: Secondary | ICD-10-CM | POA: Insufficient documentation

## 2022-07-18 DIAGNOSIS — R928 Other abnormal and inconclusive findings on diagnostic imaging of breast: Secondary | ICD-10-CM | POA: Diagnosis not present

## 2022-07-18 DIAGNOSIS — Z6841 Body Mass Index (BMI) 40.0 and over, adult: Secondary | ICD-10-CM | POA: Diagnosis not present

## 2022-07-18 DIAGNOSIS — N6041 Mammary duct ectasia of right breast: Secondary | ICD-10-CM | POA: Insufficient documentation

## 2022-07-18 DIAGNOSIS — R920 Mammographic microcalcification found on diagnostic imaging of breast: Secondary | ICD-10-CM | POA: Diagnosis not present

## 2022-07-18 DIAGNOSIS — N6489 Other specified disorders of breast: Secondary | ICD-10-CM | POA: Diagnosis not present

## 2022-07-18 DIAGNOSIS — N6310 Unspecified lump in the right breast, unspecified quadrant: Secondary | ICD-10-CM | POA: Diagnosis not present

## 2022-07-18 DIAGNOSIS — N6011 Diffuse cystic mastopathy of right breast: Secondary | ICD-10-CM | POA: Insufficient documentation

## 2022-07-18 DIAGNOSIS — F17200 Nicotine dependence, unspecified, uncomplicated: Secondary | ICD-10-CM | POA: Diagnosis not present

## 2022-07-18 HISTORY — PX: BREAST LUMPECTOMY WITH RADIOACTIVE SEED LOCALIZATION: SHX6424

## 2022-07-18 SURGERY — BREAST LUMPECTOMY WITH RADIOACTIVE SEED LOCALIZATION
Anesthesia: General | Site: Breast | Laterality: Right

## 2022-07-18 MED ORDER — GABAPENTIN 300 MG PO CAPS
ORAL_CAPSULE | ORAL | Status: AC
  Administered 2022-07-18: 300 mg via ORAL
  Filled 2022-07-18: qty 1

## 2022-07-18 MED ORDER — PROPOFOL 10 MG/ML IV BOLUS
INTRAVENOUS | Status: AC
  Filled 2022-07-18: qty 20

## 2022-07-18 MED ORDER — CHLORHEXIDINE GLUCONATE CLOTH 2 % EX PADS: 6.0000 | MEDICATED_PAD | Freq: Once | CUTANEOUS | Status: DC

## 2022-07-18 MED ORDER — BUPIVACAINE-EPINEPHRINE 0.25% -1:200000 IJ SOLN: INTRAMUSCULAR | Status: DC | PRN

## 2022-07-18 MED ORDER — GABAPENTIN 300 MG PO CAPS: 300.0000 mg | ORAL_CAPSULE | ORAL | Status: AC

## 2022-07-18 MED ORDER — ONDANSETRON HCL 4 MG/2ML IJ SOLN
INTRAMUSCULAR | Status: AC
  Filled 2022-07-18: qty 2

## 2022-07-18 MED ORDER — LIDOCAINE 2% (20 MG/ML) 5 ML SYRINGE
INTRAMUSCULAR | Status: DC | PRN
  Administered 2022-07-18: 40 mg via INTRAVENOUS

## 2022-07-18 MED ORDER — FENTANYL CITRATE (PF) 100 MCG/2ML IJ SOLN
INTRAMUSCULAR | Status: AC
  Filled 2022-07-18: qty 2

## 2022-07-18 MED ORDER — ACETAMINOPHEN 500 MG PO TABS: 1000.0000 mg | ORAL_TABLET | Freq: Once | ORAL | Status: AC

## 2022-07-18 MED ORDER — PROMETHAZINE HCL 25 MG/ML IJ SOLN: 6.2500 mg | INTRAMUSCULAR | Status: DC | PRN

## 2022-07-18 MED ORDER — FENTANYL CITRATE (PF) 100 MCG/2ML IJ SOLN
25.0000 ug | INTRAMUSCULAR | Status: DC | PRN
  Administered 2022-07-18 (×3): 50 ug via INTRAVENOUS

## 2022-07-18 MED ORDER — ORAL CARE MOUTH RINSE: 15.0000 mL | Freq: Once | OROMUCOSAL | Status: AC

## 2022-07-18 MED ORDER — 0.9 % SODIUM CHLORIDE (POUR BTL) OPTIME
TOPICAL | Status: DC | PRN
  Administered 2022-07-18: 1000 mL

## 2022-07-18 MED ORDER — OXYCODONE HCL 5 MG/5ML PO SOLN: 5.0000 mg | Freq: Once | ORAL | Status: AC | PRN

## 2022-07-18 MED ORDER — CEFAZOLIN IN SODIUM CHLORIDE 3-0.9 GM/100ML-% IV SOLN
3.0000 g | INTRAVENOUS | Status: AC
  Administered 2022-07-18: 3 g via INTRAVENOUS

## 2022-07-18 MED ORDER — LACTATED RINGERS IV SOLN: INTRAVENOUS | Status: DC

## 2022-07-18 MED ORDER — MIDAZOLAM HCL 2 MG/2ML IJ SOLN: 0.5000 mg | Freq: Once | INTRAMUSCULAR | Status: DC | PRN

## 2022-07-18 MED ORDER — OXYCODONE HCL 5 MG PO TABS
ORAL_TABLET | ORAL | Status: AC
  Filled 2022-07-18: qty 1

## 2022-07-18 MED ORDER — MIDAZOLAM HCL 2 MG/2ML IJ SOLN
INTRAMUSCULAR | Status: AC
  Filled 2022-07-18: qty 2

## 2022-07-18 MED ORDER — CHLORHEXIDINE GLUCONATE 0.12 % MT SOLN
OROMUCOSAL | Status: AC
  Administered 2022-07-18: 15 mL via OROMUCOSAL
  Filled 2022-07-18: qty 15

## 2022-07-18 MED ORDER — SCOPOLAMINE 1 MG/3DAYS TD PT72: 1.0000 | MEDICATED_PATCH | TRANSDERMAL | Status: DC

## 2022-07-18 MED ORDER — MIDAZOLAM HCL 2 MG/2ML IJ SOLN
INTRAMUSCULAR | Status: DC | PRN
  Administered 2022-07-18: 2 mg via INTRAVENOUS

## 2022-07-18 MED ORDER — OXYCODONE HCL 5 MG PO TABS
5.0000 mg | ORAL_TABLET | Freq: Four times a day (QID) | ORAL | 0 refills | Status: DC | PRN
  Filled 2022-07-18: qty 10, 3d supply, fill #0

## 2022-07-18 MED ORDER — BUPIVACAINE HCL (PF) 0.25 % IJ SOLN
INTRAMUSCULAR | Status: DC | PRN
  Administered 2022-07-18: 20 mL

## 2022-07-18 MED ORDER — CEFAZOLIN IN SODIUM CHLORIDE 3-0.9 GM/100ML-% IV SOLN
INTRAVENOUS | Status: AC
  Filled 2022-07-18: qty 100

## 2022-07-18 MED ORDER — SCOPOLAMINE 1 MG/3DAYS TD PT72
MEDICATED_PATCH | TRANSDERMAL | Status: AC
  Administered 2022-07-18: 1.5 mg via TRANSDERMAL
  Filled 2022-07-18: qty 1

## 2022-07-18 MED ORDER — FENTANYL CITRATE (PF) 250 MCG/5ML IJ SOLN
INTRAMUSCULAR | Status: AC
  Filled 2022-07-18: qty 5

## 2022-07-18 MED ORDER — ACETAMINOPHEN 500 MG PO TABS
ORAL_TABLET | ORAL | Status: AC
  Administered 2022-07-18: 1000 mg via ORAL
  Filled 2022-07-18: qty 2

## 2022-07-18 MED ORDER — CHLORHEXIDINE GLUCONATE 0.12 % MT SOLN: 15.0000 mL | Freq: Once | OROMUCOSAL | Status: AC

## 2022-07-18 MED ORDER — FENTANYL CITRATE (PF) 100 MCG/2ML IJ SOLN
50.0000 ug | INTRAMUSCULAR | Status: DC | PRN
  Administered 2022-07-18: 50 ug via INTRAVENOUS

## 2022-07-18 MED ORDER — FENTANYL CITRATE (PF) 100 MCG/2ML IJ SOLN
INTRAMUSCULAR | Status: DC | PRN
  Administered 2022-07-18 (×2): 50 ug via INTRAVENOUS

## 2022-07-18 MED ORDER — PHENYLEPHRINE 80 MCG/ML (10ML) SYRINGE FOR IV PUSH (FOR BLOOD PRESSURE SUPPORT)
PREFILLED_SYRINGE | INTRAVENOUS | Status: DC | PRN
  Administered 2022-07-18 (×2): 160 ug via INTRAVENOUS

## 2022-07-18 MED ORDER — PROPOFOL 10 MG/ML IV BOLUS
INTRAVENOUS | Status: DC | PRN
  Administered 2022-07-18: 350 mg via INTRAVENOUS
  Administered 2022-07-18: 50 mg via INTRAVENOUS

## 2022-07-18 MED ORDER — MEPERIDINE HCL 25 MG/ML IJ SOLN: 6.2500 mg | INTRAMUSCULAR | Status: DC | PRN

## 2022-07-18 MED ORDER — OXYCODONE HCL 5 MG PO TABS
5.0000 mg | ORAL_TABLET | Freq: Once | ORAL | Status: AC | PRN
  Administered 2022-07-18: 5 mg via ORAL

## 2022-07-18 MED ORDER — CELECOXIB 200 MG PO CAPS: 200.0000 mg | ORAL_CAPSULE | ORAL | Status: AC

## 2022-07-18 MED ORDER — DEXAMETHASONE SODIUM PHOSPHATE 10 MG/ML IJ SOLN
INTRAMUSCULAR | Status: DC | PRN
  Administered 2022-07-18: 10 mg via INTRAVENOUS

## 2022-07-18 MED ORDER — CELECOXIB 200 MG PO CAPS
ORAL_CAPSULE | ORAL | Status: AC
  Administered 2022-07-18: 200 mg via ORAL
  Filled 2022-07-18: qty 1

## 2022-07-18 MED ORDER — BUPIVACAINE HCL (PF) 0.25 % IJ SOLN
INTRAMUSCULAR | Status: AC
  Filled 2022-07-18: qty 30

## 2022-07-18 MED ORDER — ONDANSETRON HCL 4 MG/2ML IJ SOLN
INTRAMUSCULAR | Status: DC | PRN
  Administered 2022-07-18: 4 mg via INTRAVENOUS

## 2022-07-18 SURGICAL SUPPLY — 28 items
APPLIER CLIP 9.375 MED OPEN (MISCELLANEOUS)
BAG COUNTER SPONGE SURGICOUNT (BAG) IMPLANT
BINDER BREAST 3XL (GAUZE/BANDAGES/DRESSINGS) IMPLANT
CANISTER SUCT 3000ML PPV (MISCELLANEOUS) ×1 IMPLANT
CHLORAPREP W/TINT 26 (MISCELLANEOUS) ×1 IMPLANT
CLIP APPLIE 9.375 MED OPEN (MISCELLANEOUS) IMPLANT
COVER PROBE W GEL 5X96 (DRAPES) ×1 IMPLANT
COVER SURGICAL LIGHT HANDLE (MISCELLANEOUS) ×1 IMPLANT
DERMABOND ADVANCED .7 DNX12 (GAUZE/BANDAGES/DRESSINGS) ×1 IMPLANT
DEVICE DUBIN SPECIMEN MAMMOGRA (MISCELLANEOUS) ×1 IMPLANT
DRAPE CHEST BREAST 15X10 FENES (DRAPES) ×1 IMPLANT
ELECT COATED BLADE 2.86 ST (ELECTRODE) ×1 IMPLANT
ELECT REM PT RETURN 9FT ADLT (ELECTROSURGICAL) ×1
ELECTRODE REM PT RTRN 9FT ADLT (ELECTROSURGICAL) ×1 IMPLANT
GLOVE BIO SURGEON STRL SZ7.5 (GLOVE) ×2 IMPLANT
GOWN STRL REUS W/ TWL LRG LVL3 (GOWN DISPOSABLE) ×2 IMPLANT
GOWN STRL REUS W/TWL LRG LVL3 (GOWN DISPOSABLE) ×1
KIT BASIN OR (CUSTOM PROCEDURE TRAY) ×1 IMPLANT
KIT MARKER MARGIN INK (KITS) ×1 IMPLANT
LIGHT WAVEGUIDE WIDE FLAT (MISCELLANEOUS) IMPLANT
NDL HYPO 25GX1X1/2 BEV (NEEDLE) ×1 IMPLANT
NEEDLE HYPO 25GX1X1/2 BEV (NEEDLE) ×1 IMPLANT
NS IRRIG 1000ML POUR BTL (IV SOLUTION) ×1 IMPLANT
PACK GENERAL/GYN (CUSTOM PROCEDURE TRAY) ×1 IMPLANT
SUT MNCRL AB 4-0 PS2 18 (SUTURE) ×1 IMPLANT
SUT VIC AB 3-0 SH 18 (SUTURE) ×1 IMPLANT
SYR CONTROL 10ML LL (SYRINGE) ×1 IMPLANT
TOWEL GREEN STERILE (TOWEL DISPOSABLE) ×1 IMPLANT

## 2022-07-18 NOTE — Anesthesia Procedure Notes (Signed)
Procedure Name: Intubation Date/Time: 07/18/2022 8:28 AM  Performed by: Inda Coke, CRNAPre-anesthesia Checklist: Patient identified, Emergency Drugs available, Suction available, Timeout performed and Patient being monitored Patient Re-evaluated:Patient Re-evaluated prior to induction Oxygen Delivery Method: Circle system utilized Preoxygenation: Pre-oxygenation with 100% oxygen Induction Type: IV induction and Rapid sequence Laryngoscope Size: Mac and 3 Grade View: Grade I Tube type: Oral Tube size: 7.0 mm Number of attempts: 1 Airway Equipment and Method: Stylet Placement Confirmation: ETT inserted through vocal cords under direct vision, positive ETCO2, CO2 detector and breath sounds checked- equal and bilateral Secured at: 23 cm Tube secured with: Tape Dental Injury: Teeth and Oropharynx as per pre-operative assessment

## 2022-07-18 NOTE — Interval H&P Note (Signed)
History and Physical Interval Note:  07/18/2022 7:54 AM  [name]  has presented today for surgery, with the diagnosis of RIGHT BREAST DISCORDANCE.  The various methods of treatment have been discussed with the patient and family. After consideration of risks, benefits and other options for treatment, the patient has consented to  Procedure(s): RIGHT BREAST LUMPECTOMY WITH RADIOACTIVE SEED LOCALIZATION (Right) as a surgical intervention.  The patient's history has been reviewed, patient examined, no change in status, stable for surgery.  I have reviewed the patient's chart and labs.  Questions were answered to the patient's satisfaction.     Autumn Messing III

## 2022-07-18 NOTE — Op Note (Signed)
07/18/2022  9:16 AM  PATIENT:  [name]  37 y.o. female  PRE-OPERATIVE DIAGNOSIS:  RIGHT BREAST DISCORDANCE  POST-OPERATIVE DIAGNOSIS:  RIGHT BREAST DISCORDANCE  PROCEDURE:  Procedure(s): RIGHT BREAST LUMPECTOMY WITH RADIOACTIVE SEED LOCALIZATION (Right)  SURGEON:  Surgeon(s) and Role:    * Jovita Kussmaul, MD - Primary  PHYSICIAN ASSISTANT:   ASSISTANTS: none   ANESTHESIA:   local and general  EBL:  20 mL   BLOOD ADMINISTERED:none  DRAINS: none   LOCAL MEDICATIONS USED:  MARCAINE     SPECIMEN:  Source of Specimen:  right breast tissue  DISPOSITION OF SPECIMEN:  PATHOLOGY  COUNTS:  YES  TOURNIQUET:  * No tourniquets in log *  DICTATION: .Dragon Dictation  After informed consent was obtained the patient was brought to the operating room and placed in the supine position on the operating table.  After adequate induction of general anesthesia the patient's right breast was prepped with ChloraPrep, allowed to dry, and draped in usual sterile manner.  An appropriate timeout was performed.  Previously an I-125 seed was placed in the upper portion of the right breast to mark an area of discordance.  The neoprobe was set to I-125 in the area of radioactivity was readily identified.  The area around this was infiltrated with quarter percent Marcaine.  Because it was very far in the upper portion of the right breast I elected to make an elliptical incision in the skin overlying the area of radioactivity with a 15 blade knife.  The incision was carried through the skin and subcutaneous tissue sharply with the electrocautery.  Dissection was then carried towards the radioactive seed under the direction of the neoprobe.  Once I more closely approach the radioactive seed I then removed a circular portion of breast tissue sharply with the electrocautery around the radioactive seed while checking the area of radioactivity frequently.  Once the specimen was removed it was oriented with the  appropriate paint colors.  A specimen radiograph was obtained that showed the clip and seed to be within the specimen.  The specimen was then sent to pathology for further evaluation.  Hemostasis was achieved using the Bovie electrocautery.  The wound was irrigated with saline and infiltrated with more quarter percent Marcaine.  The deep layer of the incision was then closed with layers of interrupted 3-0 Vicryl stitches.  The skin was then closed with interrupted 4-0 Monocryl subcuticular stitches.  Dermabond dressings were applied.  The patient tolerated the procedure well.  At the end of the case all needle sponge and instrument counts were correct.  The patient was then awakened and taken to recovery in stable condition.  PLAN OF CARE: Discharge to home after PACU  PATIENT DISPOSITION:  PACU - hemodynamically stable.   Delay start of Pharmacological VTE agent (>24hrs) due to surgical blood loss or risk of bleeding: not applicable

## 2022-07-18 NOTE — Transfer of Care (Signed)
Immediate Anesthesia Transfer of Care Note  Patient: MEIAH ZAMUDIO  Procedure(s) Performed: RIGHT BREAST LUMPECTOMY WITH RADIOACTIVE SEED LOCALIZATION (Right: Breast)  Patient Location: PACU  Anesthesia Type:General  Level of Consciousness: awake and alert   Airway & Oxygen Therapy: Patient Spontanous Breathing  Post-op Assessment: Report given to RN and Post -op Vital signs reviewed and stable  Post vital signs: Reviewed and stable  Last Vitals:  Vitals Value Taken Time  BP 140/90 07/18/22 0930  Temp    Pulse 73 07/18/22 0930  Resp 14 07/18/22 0930  SpO2 100 % 07/18/22 0930  Vitals shown include unvalidated device data.  Last Pain:  Vitals:   07/18/22 0708  TempSrc:   PainSc: 6       Patients Stated Pain Goal: 0 (93/96/88 [id number])  Complications: No notable events documented.

## 2022-07-18 NOTE — H&P (Signed)
REFERRING PHYSICIAN: Alvira Monday  PROVIDER: Landry Corporal, MD  MRN: [id number] DOB: Sep 03, 1985 Subjective  Chief Complaint: New Consultation   History of Present Illness: [name] is a 37 y.o. female who is seen today as an office consultation for evaluation of New Consultation .  We are asked to see the patient in consultation by Dr. Alvira Monday to evaluate her for a papilloma in the right breast. The patient is a 37 year old black female who recently went for a routine mammogram. She was found at that time to have a 1 cm asymmetry in the upper portion of the right breast. This was biopsied and came back as an intraductal papilloma. The radiologist felt that this was discordant and recommend excision of the area. She does have a strong family history of breast cancer in her mother and grandmother. She is otherwise in good health and does not smoke.  Review of Systems: A complete review of systems was obtained from the patient. I have reviewed this information and discussed as appropriate with the patient. See HPI as well for other ROS.  ROS  Medical History: Past Medical History: Diagnosis Date Hypertension  Patient Active Problem List Diagnosis Intraductal papilloma of right breast  History reviewed. No pertinent surgical history.  No Known Allergies  Current Outpatient Medications on File Prior to Visit Medication Sig Dispense Refill pantoprazole (PROTONIX) 20 MG DR tablet Take 20 mg by mouth once daily phentermine (ADIPEX-P) 30 MG capsule Take 30 mg by mouth every morning telmisartan-hydroCHLOROthiazide (MICARDIS HCT) 40-12.5 mg tablet Take 1 tablet by mouth once daily  No current facility-administered medications on file prior to visit.  Family History Problem Relation Age of Onset Breast cancer Mother   Social History  Tobacco Use Smoking Status Never Smokeless Tobacco Never   Social History  Socioeconomic History Marital status:  Single Tobacco Use Smoking status: Never Smokeless tobacco: Never Substance and Sexual Activity Alcohol use: Yes Comment: social Drug use: Never  Objective:  Vitals: BP: 110/75 Pulse: 88 Temp: 36.6 C (97.8 F) SpO2: 99% Weight: (!) 174.6 kg (385 lb) Height: 167.6 cm ('5\' 6"'$ )  Body mass index is 62.14 kg/m.  Physical Exam Vitals reviewed. Constitutional: General: She is not in acute distress. Appearance: Normal appearance. HENT: Head: Normocephalic and atraumatic. Right Ear: External ear normal. Left Ear: External ear normal. Nose: Nose normal. Mouth/Throat: Mouth: Mucous membranes are moist. Pharynx: Oropharynx is clear. Eyes: General: No scleral icterus. Extraocular Movements: Extraocular movements intact. Conjunctiva/sclera: Conjunctivae normal. Pupils: Pupils are equal, round, and reactive to light. Cardiovascular: Rate and Rhythm: Normal rate and regular rhythm. Pulses: Normal pulses. Heart sounds: Normal heart sounds. Pulmonary: Effort: Pulmonary effort is normal. No respiratory distress. Breath sounds: Normal breath sounds. Abdominal: General: Bowel sounds are normal. Palpations: Abdomen is soft. Tenderness: There is no abdominal tenderness. Musculoskeletal: General: No swelling, tenderness or deformity. Normal range of motion. Cervical back: Normal range of motion and neck supple. Skin: General: Skin is warm and dry. Coloration: Skin is not jaundiced. Neurological: General: No focal deficit present. Mental Status: She is alert and oriented to person, place, and time. Psychiatric: Mood and Affect: Mood normal. Behavior: Behavior normal.    Breast: There is an ulceration in the skin of the upper portion of the right breast at the biopsy site that measures about a centimeter. Other than this there is no palpable mass in either breast. There is no palpable axillary, supraclavicular, or cervical lymphadenopathy.  Labs, Imaging and Diagnostic  Testing:  Assessment and Plan:  Diagnoses and all orders for this visit:  Intraductal papilloma of right breast    The patient appears to have a 1 cm asymmetry in the upper portion of the right breast that was biopsied and came back as a benign papilloma. This was felt to be discordant. In order to make sure that there is nothing more significant that we are missing the recommendation is to have this area removed. I have discussed with her in detail the risks and benefits of the operation as well as some of the technical aspects including use of a radioactive seed for localization and she understands and wishes to proceed.

## 2022-07-18 NOTE — Anesthesia Postprocedure Evaluation (Signed)
Anesthesia Post Note  Patient: [name]  Procedure(s) Performed: RIGHT BREAST LUMPECTOMY WITH RADIOACTIVE SEED LOCALIZATION (Right: Breast)     Patient location during evaluation: PACU Anesthesia Type: General Level of consciousness: awake and alert Pain management: pain level controlled Vital Signs Assessment: post-procedure vital signs reviewed and stable Respiratory status: spontaneous breathing, nonlabored ventilation and respiratory function stable Cardiovascular status: blood pressure returned to baseline and stable Postop Assessment: no apparent nausea or vomiting Anesthetic complications: no   No notable events documented.  Last Vitals:  Vitals:   07/18/22 1005 07/18/22 1015  BP:  127/75  Pulse: (!) 58 (!) 57  Resp: 11 16  Temp:  36.5 C  SpO2: 100% 100%    Last Pain:  Vitals:   07/18/22 1015  TempSrc:   PainSc: 2                  Cornella Emmer

## 2022-07-19 ENCOUNTER — Other Ambulatory Visit (HOSPITAL_COMMUNITY): Payer: Self-pay

## 2022-07-19 ENCOUNTER — Encounter (HOSPITAL_COMMUNITY): Payer: Self-pay | Admitting: General Surgery

## 2022-07-19 LAB — SURGICAL PATHOLOGY

## 2022-07-25 ENCOUNTER — Other Ambulatory Visit (HOSPITAL_COMMUNITY): Payer: Self-pay

## 2022-07-28 ENCOUNTER — Ambulatory Visit (INDEPENDENT_AMBULATORY_CARE_PROVIDER_SITE_OTHER): Payer: 59 | Admitting: Family Medicine

## 2022-07-28 ENCOUNTER — Other Ambulatory Visit (HOSPITAL_COMMUNITY)
Admission: RE | Admit: 2022-07-28 | Discharge: 2022-07-28 | Disposition: A | Discharge status: Home / Self Care | Payer: 59 | Source: Ambulatory Visit | Attending: Family Medicine | Admitting: Family Medicine

## 2022-07-28 ENCOUNTER — Encounter: Payer: Self-pay | Admitting: Family Medicine

## 2022-07-28 ENCOUNTER — Other Ambulatory Visit: Payer: Self-pay

## 2022-07-28 ENCOUNTER — Other Ambulatory Visit (HOSPITAL_COMMUNITY): Payer: Self-pay

## 2022-07-28 VITALS — BP 131/84 | HR 69 | Resp 16 | Ht 66.0 in | Wt 383.0 lb

## 2022-07-28 DIAGNOSIS — Z124 Encounter for screening for malignant neoplasm of cervix: Secondary | ICD-10-CM | POA: Insufficient documentation

## 2022-07-28 DIAGNOSIS — E559 Vitamin D deficiency, unspecified: Secondary | ICD-10-CM | POA: Diagnosis not present

## 2022-07-28 MED ORDER — VITAMIN D (ERGOCALCIFEROL) 1.25 MG (50000 UNIT) PO CAPS
50000.0000 [IU] | ORAL_CAPSULE | ORAL | 0 refills | Status: DC
  Filled 2022-07-28: qty 12, 84d supply, fill #0

## 2022-07-28 NOTE — Patient Instructions (Addendum)
I appreciate the opportunity to provide care to you today!    Follow up:  3 months  Labs: next visit  We will call you with the results of your pap smear    Please continue to a heart-healthy diet and increase your physical activities. Try to exercise for 53mns at least five times a week.      It was a pleasure to see you and I look forward to continuing to work together on your health and well-being. Please do not hesitate to call the office if you need care or have questions about your care.   Have a wonderful day and week. With Gratitude, GAlvira MondayMSN, FNP-BC

## 2022-07-28 NOTE — Progress Notes (Signed)
Acute Office Visit  Subjective:    Patient ID: [name], female    DOB: 1985-07-08, 37 y.o.   MRN: [id number]  Chief Complaint  Patient presents with   pap    Pap smear due     HPI Patient is in today for Pap smear examination.  Past Medical History:  Diagnosis Date   GERD (gastroesophageal reflux disease)    Hypertension    No pertinent past medical history    Postpartum pain 06/30/2011   Vaginal delivery, moderate bleeding, tender in abd, esp LLQ    Past Surgical History:  Procedure Laterality Date   BREAST BIOPSY Right 05/20/2022   MM RT BREAST BX W LOC DEV 1ST LESION IMAGE BX SPEC STEREO GUIDE 05/20/2022 [facility]   BREAST BIOPSY  07/15/2022   MM RT RADIOACTIVE SEED LOC MAMMO GUIDE 07/15/2022 [facility]   BREAST LUMPECTOMY WITH RADIOACTIVE SEED LOCALIZATION Right 07/18/2022   Procedure: RIGHT BREAST LUMPECTOMY WITH RADIOACTIVE SEED LOCALIZATION;  Surgeon: Jovita Kussmaul, MD;  Location: [facility] OR;  Service: General;  Laterality: Right;   DILATION AND CURETTAGE OF UTERUS     VAGINAL DELIVERY  06/30/11   [facility]    Family History  Problem Relation Age of Onset   Breast cancer Mother    Breast cancer Maternal Grandmother    Anesthesia problems Neg Hx    Hypotension Neg Hx    Malignant hyperthermia Neg Hx    Pseudochol deficiency Neg Hx     Social History   Socioeconomic History   Marital status: Single    Spouse name: Not on file   Number of children: Not on file   Years of education: Not on file   Highest education level: Not on file  Occupational History   Not on file  Tobacco Use   Smoking status: Former    Packs/day: 0.25    Years: 1.00    Total pack years: 0.25    Types: Cigarettes   Smokeless tobacco: Never  Vaping Use   Vaping Use: Never used  Substance and Sexual Activity   Alcohol use: No   Drug use: No   Sexual activity: Not Currently    Birth control/protection: None    Comment: pt is currently pregnant  Other Topics Concern    Not on file  Social History Narrative   Not on file   Social Determinants of Health   Financial Resource Strain: Not on file  Food Insecurity: Not on file  Transportation Needs: Not on file  Physical Activity: Not on file  Stress: Not on file  Social Connections: Not on file  Intimate Partner Violence: Not on file    Outpatient Medications Prior to Visit  Medication Sig Dispense Refill   acidophilus (RISAQUAD) CAPS capsule Take 1 capsule by mouth daily.     pantoprazole (PROTONIX) 40 MG tablet Take 1 tablet (40 mg total) by mouth daily. 30 tablet 2   telmisartan-hydrochlorothiazide (MICARDIS HCT) 40-12.5 MG tablet Take 1 tablet by mouth daily. 150 tablet 0   Vitamin D, Ergocalciferol, (DRISDOL) 1.25 MG (50000 UNIT) CAPS capsule TAKE 1 CAPSULE BY MOUTH EVERY 7 DAYS 13 capsule 0   Semaglutide-Weight Management (WEGOVY) 0.25 MG/0.5ML SOAJ Inject 0.25 mg into the skin once a week. (Patient not taking: Reported on 07/28/2022) 2 mL 0   oxyCODONE (ROXICODONE) 5 MG immediate release tablet Take 1 tablet (5 mg total) by mouth every 6 (six) hours as needed for severe pain. (Patient not taking: Reported on  07/28/2022) 10 tablet 0   No facility-administered medications prior to visit.    No Known Allergies  Review of Systems  Constitutional:  Negative for chills and fever.  Eyes:  Negative for visual disturbance.  Respiratory:  Negative for chest tightness and shortness of breath.   Genitourinary:  Negative for hematuria, urgency, vaginal bleeding, vaginal discharge and vaginal pain.  Neurological:  Negative for dizziness and headaches.       Objective:    Physical Exam HENT:     Head: Normocephalic.     Left Ear: External ear normal.     Mouth/Throat:     Mouth: Mucous membranes are moist.  Eyes:     Extraocular Movements: Extraocular movements intact.     Pupils: Pupils are equal, round, and reactive to light.  Cardiovascular:     Rate and Rhythm: Normal rate and regular  rhythm.     Heart sounds: No murmur heard. Pulmonary:     Effort: Pulmonary effort is normal.     Breath sounds: Normal breath sounds.  Abdominal:     Palpations: Abdomen is soft.     Tenderness: There is no right CVA tenderness or left CVA tenderness.  Genitourinary:    Exam position: Lithotomy position.     Pubic Area: No rash.      Tanner stage (genital): 5.     Labia:        Right: No rash, tenderness or lesion.        Left: No tenderness or lesion.      Urethra: No urethral lesion.     Comments: Vaginal wall: pink and rugated, smooth and non-tender; absence of lesions, edema, and erythema. Labia Majora and Minora: present bilaterally, moist, soft tissue, and homogeneous; free of edema and ulcerations. Clitoris is anatomically present, above the urethral, and free of lesions, masses, and ulceration.    Musculoskeletal:     Right lower leg: No edema.     Left lower leg: No edema.  Skin:    Findings: No lesion or rash.  Neurological:     Mental Status: She is alert and oriented to person, place, and time.     GCS: GCS eye subscore is 4. GCS verbal subscore is 5. GCS motor subscore is 6.     Cranial Nerves: No facial asymmetry.     Sensory: No sensory deficit.     Motor: No weakness.     Coordination: Coordination normal. Finger-Nose-Finger Test normal.     Gait: Gait normal.  Psychiatric:        Judgment: Judgment normal.     BP 131/84   Pulse 69   Resp 16   Ht 5' 6"$  (1.676 m)   Wt (!) 383 lb (173.7 kg)   LMP 07/25/2022   SpO2 95%   BMI 61.82 kg/m  Wt Readings from Last 3 Encounters:  07/28/22 (!) 383 lb (173.7 kg)  07/18/22 (!) 383 lb 6.1 oz (173.9 kg)  07/12/22 (!) 383 lb 4.8 oz (173.9 kg)       Assessment & Plan:  Pap smear for cervical cancer screening Assessment & Plan: -Cytology and HPV co-testing (preferred) every 5 years or cytology alone (acceptable) every 3 years. - Pap due 2027    Orders: -     Cytology - PAP  Vitamin D deficiency -      Vitamin D (Ergocalciferol); Take 1 capsule (50,000 Units total) by mouth every 7 (seven) days.  Dispense: 12 capsule; Refill: 0  Alvira Monday, FNP

## 2022-07-29 ENCOUNTER — Other Ambulatory Visit (HOSPITAL_COMMUNITY): Payer: Self-pay

## 2022-07-29 DIAGNOSIS — Z124 Encounter for screening for malignant neoplasm of cervix: Secondary | ICD-10-CM | POA: Insufficient documentation

## 2022-07-29 NOTE — Assessment & Plan Note (Signed)
-  Cytology and HPV co-testing (preferred) every 5 years or cytology alone (acceptable) every 3 years. - Pap due 2027

## 2022-08-01 LAB — CYTOLOGY - PAP
Comment: NEGATIVE
Diagnosis: NEGATIVE
High risk HPV: NEGATIVE

## 2022-08-01 NOTE — Progress Notes (Signed)
Please inform the patient that her pap smear was negative for abnormal growth or malignancy on her cervix.

## 2022-08-03 DIAGNOSIS — D241 Benign neoplasm of right breast: Secondary | ICD-10-CM | POA: Diagnosis not present

## 2022-08-17 ENCOUNTER — Other Ambulatory Visit: Payer: Self-pay | Admitting: Family Medicine

## 2022-08-17 ENCOUNTER — Encounter: Payer: Self-pay | Admitting: Family Medicine

## 2022-08-22 ENCOUNTER — Other Ambulatory Visit: Payer: Self-pay | Admitting: Family Medicine

## 2022-08-22 ENCOUNTER — Other Ambulatory Visit: Payer: Self-pay

## 2022-08-22 MED ORDER — PHENTERMINE HCL 30 MG PO CAPS
30.0000 mg | ORAL_CAPSULE | ORAL | 0 refills | Status: DC
  Filled 2022-08-22: qty 30, 30d supply, fill #0

## 2022-09-03 ENCOUNTER — Other Ambulatory Visit: Payer: Self-pay

## 2022-09-03 ENCOUNTER — Other Ambulatory Visit: Payer: Self-pay | Admitting: Family Medicine

## 2022-09-03 DIAGNOSIS — I10 Essential (primary) hypertension: Secondary | ICD-10-CM

## 2022-09-05 ENCOUNTER — Other Ambulatory Visit (HOSPITAL_COMMUNITY): Payer: Self-pay

## 2022-09-05 ENCOUNTER — Other Ambulatory Visit: Payer: Self-pay

## 2022-09-05 MED ORDER — TELMISARTAN-HCTZ 40-12.5 MG PO TABS
1.0000 | ORAL_TABLET | Freq: Every day | ORAL | 0 refills | Status: DC
  Filled 2022-09-05: qty 90, 90d supply, fill #0
  Filled 2022-12-21: qty 60, 60d supply, fill #1

## 2022-09-19 ENCOUNTER — Other Ambulatory Visit: Payer: Self-pay | Admitting: Family Medicine

## 2022-09-19 NOTE — Telephone Encounter (Signed)
Needs appt

## 2022-09-20 ENCOUNTER — Telehealth: Payer: Self-pay

## 2022-09-20 NOTE — Telephone Encounter (Signed)
erro

## 2022-09-21 NOTE — Telephone Encounter (Signed)
No, I need the patient in the clinic to monitor heart rate and respiration and also to assess for weight

## 2022-10-03 ENCOUNTER — Other Ambulatory Visit (HOSPITAL_COMMUNITY): Payer: Self-pay

## 2022-10-20 ENCOUNTER — Ambulatory Visit: Payer: 59 | Admitting: Family Medicine

## 2022-10-24 ENCOUNTER — Other Ambulatory Visit: Payer: Self-pay | Admitting: Family Medicine

## 2022-10-24 ENCOUNTER — Other Ambulatory Visit (HOSPITAL_COMMUNITY): Payer: Self-pay

## 2022-10-24 DIAGNOSIS — E559 Vitamin D deficiency, unspecified: Secondary | ICD-10-CM

## 2022-10-25 ENCOUNTER — Other Ambulatory Visit (HOSPITAL_COMMUNITY): Payer: Self-pay

## 2022-10-25 ENCOUNTER — Other Ambulatory Visit: Payer: Self-pay

## 2022-10-25 MED ORDER — VITAMIN D (ERGOCALCIFEROL) 1.25 MG (50000 UNIT) PO CAPS
50000.0000 [IU] | ORAL_CAPSULE | ORAL | 0 refills | Status: DC
  Filled 2022-10-25: qty 12, 84d supply, fill #0

## 2022-10-27 ENCOUNTER — Ambulatory Visit (INDEPENDENT_AMBULATORY_CARE_PROVIDER_SITE_OTHER): Payer: 59 | Admitting: Family Medicine

## 2022-10-27 ENCOUNTER — Other Ambulatory Visit (HOSPITAL_COMMUNITY): Payer: Self-pay

## 2022-10-27 ENCOUNTER — Other Ambulatory Visit: Payer: Self-pay

## 2022-10-27 ENCOUNTER — Encounter: Payer: Self-pay | Admitting: Family Medicine

## 2022-10-27 VITALS — BP 122/80 | HR 80 | Ht 67.0 in | Wt 383.0 lb

## 2022-10-27 DIAGNOSIS — E559 Vitamin D deficiency, unspecified: Secondary | ICD-10-CM | POA: Diagnosis not present

## 2022-10-27 DIAGNOSIS — E7849 Other hyperlipidemia: Secondary | ICD-10-CM

## 2022-10-27 DIAGNOSIS — F411 Generalized anxiety disorder: Secondary | ICD-10-CM | POA: Diagnosis not present

## 2022-10-27 DIAGNOSIS — R7301 Impaired fasting glucose: Secondary | ICD-10-CM

## 2022-10-27 DIAGNOSIS — I10 Essential (primary) hypertension: Secondary | ICD-10-CM | POA: Diagnosis not present

## 2022-10-27 DIAGNOSIS — E0789 Other specified disorders of thyroid: Secondary | ICD-10-CM | POA: Diagnosis not present

## 2022-10-27 DIAGNOSIS — N939 Abnormal uterine and vaginal bleeding, unspecified: Secondary | ICD-10-CM | POA: Diagnosis not present

## 2022-10-27 MED ORDER — SERTRALINE HCL 25 MG PO TABS
25.0000 mg | ORAL_TABLET | Freq: Every day | ORAL | 3 refills | Status: DC
Start: 2022-10-27 — End: 2022-10-27
  Filled 2022-10-27: qty 30, 30d supply, fill #0

## 2022-10-27 MED ORDER — BUSPIRONE HCL 5 MG PO TABS
5.0000 mg | ORAL_TABLET | Freq: Two times a day (BID) | ORAL | 1 refills | Status: DC
Start: 2022-10-27 — End: 2022-11-30
  Filled 2022-10-27: qty 60, 30d supply, fill #0
  Filled 2022-11-21: qty 60, 30d supply, fill #1

## 2022-10-27 MED ORDER — PHENTERMINE HCL 37.5 MG PO CAPS
37.5000 mg | ORAL_CAPSULE | ORAL | 0 refills | Status: DC
Start: 2022-10-27 — End: 2022-11-30
  Filled 2022-10-27: qty 30, 30d supply, fill #0

## 2022-10-27 NOTE — Assessment & Plan Note (Signed)
BP Readings from Last 3 Encounters:  10/27/22 122/80  07/28/22 131/84  07/18/22 127/75  Controlled She takes telmisartan hydrochlorothiazide 40- 12.5 mg Dash diet reviewed Encouraged low-sodium diet Follow-up in 1 month

## 2022-10-27 NOTE — Assessment & Plan Note (Signed)
GAD-7 is 21 Reports increased life stresses No reports of suicidal thoughts or ideation We will start the patient on BuSpar 5 mg twice daily  Will follow-up in 4 weeks

## 2022-10-27 NOTE — Progress Notes (Signed)
Established Patient Office Visit  Subjective:  Patient ID: [name], female    DOB: Sep 01, 1985  Age: 37 y.o. MRN: [id number]  CC:  Chief Complaint  Patient presents with   Chronic Care Management    3 month f/u, pt is due for labs. Pt reports spotting a lot, with periods severe cramping.    Anxiety    Pt reports anxiety has flared up, used to be on something for this stopped it.     HPI [name] is a 37 y.o. female with past medical history of hypertension and GERD presents for f/u of  chronic medical conditions. For the details of today's visit, please refer to the assessment and plan.     Past Medical History:  Diagnosis Date   GERD (gastroesophageal reflux disease)    Hypertension    No pertinent past medical history    Postpartum pain 06/30/2011   Vaginal delivery, moderate bleeding, tender in abd, esp LLQ    Past Surgical History:  Procedure Laterality Date   BREAST BIOPSY Right 05/20/2022   MM RT BREAST BX W LOC DEV 1ST LESION IMAGE BX SPEC STEREO GUIDE 05/20/2022 [facility]   BREAST BIOPSY  07/15/2022   MM RT RADIOACTIVE SEED LOC MAMMO GUIDE 07/15/2022 [facility]   BREAST LUMPECTOMY WITH RADIOACTIVE SEED LOCALIZATION Right 07/18/2022   Procedure: RIGHT BREAST LUMPECTOMY WITH RADIOACTIVE SEED LOCALIZATION;  Surgeon: Griselda Miner, MD;  Location: [facility] OR;  Service: General;  Laterality: Right;   DILATION AND CURETTAGE OF UTERUS     VAGINAL DELIVERY  06/30/11   [facility]    Family History  Problem Relation Age of Onset   Breast cancer Mother    Breast cancer Maternal Grandmother    Anesthesia problems Neg Hx    Hypotension Neg Hx    Malignant hyperthermia Neg Hx    Pseudochol deficiency Neg Hx     Social History   Socioeconomic History   Marital status: Single    Spouse name: Not on file   Number of children: Not on file   Years of education: Not on file   Highest education level: Associate degree: academic program  Occupational History    Not on file  Tobacco Use   Smoking status: Former    Packs/day: 0.25    Years: 1.00    Additional pack years: 0.00    Total pack years: 0.25    Types: Cigarettes   Smokeless tobacco: Never  Vaping Use   Vaping Use: Never used  Substance and Sexual Activity   Alcohol use: No   Drug use: No   Sexual activity: Not Currently    Birth control/protection: None    Comment: pt is currently pregnant  Other Topics Concern   Not on file  Social History Narrative   Not on file   Social Determinants of Health   Financial Resource Strain: Low Risk  (10/25/2022)   Overall Financial Resource Strain (CARDIA)    Difficulty of Paying Living Expenses: Not hard at all  Food Insecurity: No Food Insecurity (10/25/2022)   Hunger Vital Sign    Worried About Running Out of Food in the Last Year: Never true    Ran Out of Food in the Last Year: Never true  Transportation Needs: No Transportation Needs (10/25/2022)   PRAPARE - Administrator, Civil Service (Medical): No    Lack of Transportation (Non-Medical): No  Physical Activity: Sufficiently Active (10/25/2022)   Exercise Vital Sign  Days of Exercise per Week: 3 days    Minutes of Exercise per Session: 110 min  Stress: Stress Concern Present (10/25/2022)   [employer] of Occupational Health - Occupational Stress Questionnaire    Feeling of Stress : To some extent  Social Connections: Socially Isolated (10/25/2022)   Social Connection and Isolation Panel [NHANES]    Frequency of Communication with Friends and Family: Never    Frequency of Social Gatherings with Friends and Family: Never    Attends Religious Services: Never    Database administrator or Organizations: No    Attends Engineer, structural: Not on file    Marital Status: Never married  Intimate Partner Violence: Not on file    Outpatient Medications Prior to Visit  Medication Sig Dispense Refill   acidophilus (RISAQUAD) CAPS capsule Take 1 capsule by  mouth daily.     pantoprazole (PROTONIX) 40 MG tablet Take 1 tablet (40 mg total) by mouth daily. 30 tablet 2   phentermine 30 MG capsule Take 1 capsule (30 mg total) by mouth every morning. 30 capsule 0   Semaglutide-Weight Management (WEGOVY) 0.25 MG/0.5ML SOAJ Inject 0.25 mg into the skin once a week. 2 mL 0   telmisartan-hydrochlorothiazide (MICARDIS HCT) 40-12.5 MG tablet Take 1 tablet by mouth daily. 150 tablet 0   Vitamin D, Ergocalciferol, (DRISDOL) 1.25 MG (50000 UNIT) CAPS capsule Take 1 capsule (50,000 Units total) by mouth every 7 (seven) days. 12 capsule 0   No facility-administered medications prior to visit.    No Known Allergies  ROS Review of Systems  Constitutional:  Negative for chills and fever.  Eyes:  Negative for visual disturbance.  Respiratory:  Negative for chest tightness and shortness of breath.   Neurological:  Negative for dizziness and headaches.  Psychiatric/Behavioral:  Negative for suicidal ideas.       Objective:    Physical Exam HENT:     Head: Normocephalic.     Mouth/Throat:     Mouth: Mucous membranes are moist.  Cardiovascular:     Rate and Rhythm: Normal rate.     Heart sounds: Normal heart sounds.  Pulmonary:     Effort: Pulmonary effort is normal.     Breath sounds: Normal breath sounds.  Neurological:     Mental Status: She is alert.     BP 122/80   Pulse 80   Ht 5\' 7"  (1.702 m)   Wt (!) 383 lb 0.6 oz (173.7 kg)   SpO2 95%   BMI 59.99 kg/m  Wt Readings from Last 3 Encounters:  10/27/22 (!) 383 lb 0.6 oz (173.7 kg)  07/28/22 (!) 383 lb (173.7 kg)  07/18/22 (!) 383 lb 6.1 oz (173.9 kg)    Lab Results  Component Value Date   TSH 1.430 01/11/2022   Lab Results  Component Value Date   WBC 7.2 07/12/2022   HGB 12.4 07/12/2022   HCT 40.7 07/12/2022   MCV 87.9 07/12/2022   PLT 248 07/12/2022   Lab Results  Component Value Date   NA 136 07/12/2022   K 4.3 07/12/2022   CO2 24 07/12/2022   GLUCOSE 100 (H)  07/12/2022   BUN 13 07/12/2022   CREATININE 0.86 07/12/2022   BILITOT 0.3 01/11/2022   ALKPHOS 60 01/11/2022   AST 12 01/11/2022   ALT 13 01/11/2022   PROT 6.6 01/11/2022   ALBUMIN 3.6 (L) 01/11/2022   CALCIUM 9.7 07/12/2022   ANIONGAP 9 07/12/2022   EGFR 111 01/11/2022  Lab Results  Component Value Date   CHOL 181 01/11/2022   Lab Results  Component Value Date   HDL 55 01/11/2022   Lab Results  Component Value Date   LDLCALC 98 01/11/2022   Lab Results  Component Value Date   TRIG 165 (H) 01/11/2022   Lab Results  Component Value Date   CHOLHDL 3.3 01/11/2022   Lab Results  Component Value Date   HGBA1C 5.5 01/11/2022      Assessment & Plan:  Essential hypertension Assessment & Plan: BP Readings from Last 3 Encounters:  10/27/22 122/80  07/28/22 131/84  07/18/22 127/75  Controlled She takes telmisartan hydrochlorothiazide 40- 12.5 mg Dash diet reviewed Encouraged low-sodium diet Follow-up in 1 month  Orders: -     CBC with Differential/Platelet -     CMP14+EGFR  Abnormal uterine bleeding (AUB) Assessment & Plan: History of irregular menses She complains of dysmenorrhea Denies symptoms of pelvic pain No systematic symptoms reported Had bilateral tubal ligation 8 years ago Reports having 1 ovary and 1 fallopian tube She reports spotting and heavy menses with her last cycle The patient is currently not on birth control and does not want to start birth control at the moment No history of fibroids reported Will get a transvaginal ultrasound to rule out fibroids Encouraged her to continue taking NSAIDs as needed for dysmenorrhea  Orders: -     US PELVIC COMPLETE WITH TRANSVAGINAL  GAD (generalized anxiety disorder) Assessment & Plan: GAD-7 is 21 Reports increased life stresses No reports of suicidal thoughts or ideation We will start the patient on BuSpar 5 mg twice daily  Will follow-up in 4 weeks   Orders: -     busPIRone HCl; Take 1  tablet (5 mg total) by mouth 2 (two) times daily.  Dispense: 60 tablet; Refill: 1  Morbid obesity (HCC) Assessment & Plan: She takes phentermine 30 mg daily Denies symptoms of elevated BP and palpitations Will increase phentermine to 37.5 mg today Encouraged lifestyle changes with a heart healthy diet and increase physical activity Wt Readings from Last 3 Encounters:  10/27/22 (!) 383 lb 0.6 oz (173.7 kg)  07/28/22 (!) 383 lb (173.7 kg)  07/18/22 (!) 383 lb 6.1 oz (173.9 kg)     Orders: -     Phentermine HCl; Take 1 capsule (37.5 mg total) by mouth every morning.  Dispense: 30 capsule; Refill: 0  IFG (impaired fasting glucose) -     Hemoglobin A1c  Other specified disorders of thyroid -     TSH + free T4  Other hyperlipidemia -     Lipid panel  Vitamin D deficiency -     VITAMIN D 25 Hydroxy (Vit-D Deficiency, Fractures)    Follow-up: Return in about 1 month (around 11/27/2022).   Gilmore Laroche, FNP

## 2022-10-27 NOTE — Assessment & Plan Note (Signed)
History of irregular menses She complains of dysmenorrhea Denies symptoms of pelvic pain No systematic symptoms reported Had bilateral tubal ligation 8 years ago Reports having 1 ovary and 1 fallopian tube She reports spotting and heavy menses with her last cycle The patient is currently not on birth control and does not want to start birth control at the moment No history of fibroids reported Will get a transvaginal ultrasound to rule out fibroids Encouraged her to continue taking NSAIDs as needed for dysmenorrhea

## 2022-10-27 NOTE — Patient Instructions (Addendum)
I appreciate the opportunity to provide care to you today!    Follow up:  1 month  Labs: please stop by the lab today to get your blood drawn (CBC, CMP, TSH, Lipid profile, HgA1c, Vit D)  Anxiety Please start taking Buspar 5 mg BID May take at least 2 weeks for benefit, and 4-8 weeks for full effect Avoid drinking large amounts of grapefruit juice while on this medication Anxiety disorders: assess efficacy and modify dosing as necessary for at least 4 weeks  Please stop by [facility] to get transvaginal U/S of the pelvis  It is important to assess your  heart rate and respiration while taking phentermine. The main cardiovascular adverse effects of the medications are palpitation, mild tachycardia, and elevated blood pressure.   Please continue to a heart-healthy diet and increase your physical activities. Try to exercise for at least five days a week.      It was a pleasure to see you and I look forward to continuing to work together on your health and well-being. Please do not hesitate to call the office if you need care or have questions about your care.   Have a wonderful day and week. With Gratitude, Gilmore Laroche MSN, FNP-BC

## 2022-10-27 NOTE — Assessment & Plan Note (Signed)
She takes phentermine 30 mg daily Denies symptoms of elevated BP and palpitations Will increase phentermine to 37.5 mg today Encouraged lifestyle changes with a heart healthy diet and increase physical activity Wt Readings from Last 3 Encounters:  10/27/22 (!) 383 lb 0.6 oz (173.7 kg)  07/28/22 (!) 383 lb (173.7 kg)  07/18/22 (!) 383 lb 6.1 oz (173.9 kg)

## 2022-10-28 LAB — LIPID PANEL
Chol/HDL Ratio: 2.7 ratio (ref 0.0–4.4)
Cholesterol, Total: 170 mg/dL (ref 100–199)
HDL: 62 mg/dL (ref >39)
LDL Chol Calc (NIH): 93 mg/dL (ref 0–99)
Triglycerides: 80 mg/dL (ref 0–149)
VLDL Cholesterol Cal: 15 mg/dL (ref 5–40)

## 2022-10-28 LAB — CBC WITH DIFFERENTIAL/PLATELET
Basophils Absolute: 0.1 10*3/uL (ref 0.0–0.2)
Basos: 1 %
EOS (ABSOLUTE): 0.1 10*3/uL (ref 0.0–0.4)
Eos: 2 %
Hematocrit: 38.8 % (ref 34.0–46.6)
Hemoglobin: 12.4 g/dL (ref 11.1–15.9)
Immature Grans (Abs): 0 10*3/uL (ref 0.0–0.1)
Immature Granulocytes: 0 %
Lymphocytes Absolute: 2.3 10*3/uL (ref 0.7–3.1)
Lymphs: 27 %
MCH: 27 pg (ref 26.6–33.0)
MCHC: 32 g/dL (ref 31.5–35.7)
MCV: 85 fL (ref 79–97)
Monocytes Absolute: 0.6 10*3/uL (ref 0.1–0.9)
Monocytes: 7 %
Neutrophils Absolute: 5.6 10*3/uL (ref 1.4–7.0)
Neutrophils: 63 %
Platelets: 246 10*3/uL (ref 150–450)
RBC: 4.59 x10E6/uL (ref 3.77–5.28)
RDW: 14.1 % (ref 11.7–15.4)
WBC: 8.8 10*3/uL (ref 3.4–10.8)

## 2022-10-28 LAB — TSH+FREE T4
Free T4: 1.13 ng/dL (ref 0.82–1.77)
TSH: 3.96 u[IU]/mL (ref 0.450–4.500)

## 2022-10-28 LAB — CMP14+EGFR
ALT: 12 IU/L (ref 0–32)
AST: 12 IU/L (ref 0–40)
Albumin/Globulin Ratio: 1.3 (ref 1.2–2.2)
Albumin: 3.8 g/dL — ABNORMAL LOW (ref 3.9–4.9)
Alkaline Phosphatase: 81 IU/L (ref 44–121)
BUN/Creatinine Ratio: 16 (ref 9–23)
BUN: 14 mg/dL (ref 6–20)
Bilirubin Total: <0.2 mg/dL (ref 0.0–1.2)
CO2: 23 mmol/L (ref 20–29)
Calcium: 10.4 mg/dL — ABNORMAL HIGH (ref 8.7–10.2)
Chloride: 102 mmol/L (ref 96–106)
Creatinine, Ser: 0.85 mg/dL (ref 0.57–1.00)
Globulin, Total: 3 g/dL (ref 1.5–4.5)
Glucose: 104 mg/dL — ABNORMAL HIGH (ref 70–99)
Potassium: 4.6 mmol/L (ref 3.5–5.2)
Sodium: 139 mmol/L (ref 134–144)
Total Protein: 6.8 g/dL (ref 6.0–8.5)
eGFR: 90 mL/min/{1.73_m2} (ref >59)

## 2022-10-28 LAB — HEMOGLOBIN A1C
Est. average glucose Bld gHb Est-mCnc: 134 mg/dL
Hgb A1c MFr Bld: 6.3 % — ABNORMAL HIGH (ref 4.8–5.6)

## 2022-10-28 LAB — VITAMIN D 25 HYDROXY (VIT D DEFICIENCY, FRACTURES): Vit D, 25-Hydroxy: 29.9 ng/mL — ABNORMAL LOW (ref 30.0–100.0)

## 2022-11-10 ENCOUNTER — Ambulatory Visit (HOSPITAL_COMMUNITY)
Admission: RE | Admit: 2022-11-10 | Discharge: 2022-11-10 | Disposition: A | Discharge status: Home / Self Care | Payer: 59 | Source: Ambulatory Visit | Attending: Family Medicine | Admitting: Family Medicine

## 2022-11-10 DIAGNOSIS — N83291 Other ovarian cyst, right side: Secondary | ICD-10-CM | POA: Diagnosis not present

## 2022-11-10 DIAGNOSIS — N939 Abnormal uterine and vaginal bleeding, unspecified: Secondary | ICD-10-CM | POA: Diagnosis not present

## 2022-11-21 ENCOUNTER — Other Ambulatory Visit (HOSPITAL_COMMUNITY): Payer: Self-pay

## 2022-11-21 ENCOUNTER — Other Ambulatory Visit: Payer: Self-pay

## 2022-11-21 ENCOUNTER — Other Ambulatory Visit: Payer: Self-pay | Admitting: Family Medicine

## 2022-11-21 DIAGNOSIS — K219 Gastro-esophageal reflux disease without esophagitis: Secondary | ICD-10-CM

## 2022-11-21 MED ORDER — PANTOPRAZOLE SODIUM 40 MG PO TBEC
40.0000 mg | DELAYED_RELEASE_TABLET | Freq: Every day | ORAL | 2 refills | Status: DC
Start: 2022-11-21 — End: 2023-05-19
  Filled 2022-11-21: qty 30, 30d supply, fill #0
  Filled 2022-12-21: qty 30, 30d supply, fill #1
  Filled 2023-01-30: qty 30, 30d supply, fill #2

## 2022-11-21 MED ORDER — PANTOPRAZOLE SODIUM 40 MG PO TBEC
40.0000 mg | DELAYED_RELEASE_TABLET | Freq: Every day | ORAL | 2 refills | Status: DC
Start: 2022-11-21 — End: 2022-11-21
  Filled 2022-11-21: qty 30, 30d supply, fill #0

## 2022-11-22 ENCOUNTER — Other Ambulatory Visit: Payer: Self-pay

## 2022-11-28 ENCOUNTER — Other Ambulatory Visit (HOSPITAL_COMMUNITY): Payer: Self-pay

## 2022-11-30 ENCOUNTER — Encounter: Payer: Self-pay | Admitting: Family Medicine

## 2022-11-30 ENCOUNTER — Ambulatory Visit (INDEPENDENT_AMBULATORY_CARE_PROVIDER_SITE_OTHER): Payer: 59 | Admitting: Family Medicine

## 2022-11-30 ENCOUNTER — Other Ambulatory Visit (HOSPITAL_COMMUNITY): Payer: Self-pay

## 2022-11-30 ENCOUNTER — Other Ambulatory Visit: Payer: Self-pay

## 2022-11-30 VITALS — BP 134/87 | HR 86 | Wt 390.0 lb

## 2022-11-30 DIAGNOSIS — F411 Generalized anxiety disorder: Secondary | ICD-10-CM | POA: Diagnosis not present

## 2022-11-30 DIAGNOSIS — N939 Abnormal uterine and vaginal bleeding, unspecified: Secondary | ICD-10-CM | POA: Diagnosis not present

## 2022-11-30 DIAGNOSIS — M545 Low back pain, unspecified: Secondary | ICD-10-CM

## 2022-11-30 MED ORDER — PHENTERMINE HCL 37.5 MG PO CAPS
37.5000 mg | ORAL_CAPSULE | ORAL | 0 refills | Status: DC
Start: 2022-11-30 — End: 2022-12-28
  Filled 2022-11-30: qty 30, 30d supply, fill #0

## 2022-11-30 MED ORDER — BUSPIRONE HCL 7.5 MG PO TABS
7.5000 mg | ORAL_TABLET | Freq: Two times a day (BID) | ORAL | 1 refills | Status: DC
Start: 2022-11-30 — End: 2023-05-19
  Filled 2022-11-30: qty 60, 30d supply, fill #0

## 2022-11-30 MED ORDER — MEGESTROL ACETATE 20 MG PO TABS
20.0000 mg | ORAL_TABLET | Freq: Two times a day (BID) | ORAL | 0 refills | Status: AC
Start: 2022-11-30 — End: 2022-12-10
  Filled 2022-11-30: qty 20, 10d supply, fill #0

## 2022-11-30 MED ORDER — CYCLOBENZAPRINE HCL 5 MG PO TABS
5.0000 mg | ORAL_TABLET | Freq: Every day | ORAL | 1 refills | Status: DC
Start: 2022-11-30 — End: 2023-01-30
  Filled 2022-11-30: qty 30, 30d supply, fill #0
  Filled 2023-01-02: qty 30, 30d supply, fill #1

## 2022-11-30 NOTE — Patient Instructions (Addendum)
I appreciate the opportunity to provide care to you today!    Follow up:  4 weeks  Weight loss A refill of phentermine 37.5 mg is sent to your pharmacy to take daily Please inform me if you have elevated BP and increased heart rate, irregular heartbeats while on this medication  I recommend lifestyle modification with increased physical activity and a heart healthy diet  Low back pain Continue taking over-the-counter Tylenol as needed for pain relief A prescription for Flexeril 5 mg to take at bedtime is sent to your pharmacy I also recommend application of heat and cold therapy to the affected site Avoid activities that aggravate your symptoms Please refer to the clinical reference exercises to perform  AUB Take Megace 20 mg BID for 10 days. I recommend starting treatment on the first day of your menstrual cycle  Anxiety I recommend deep breathing exercises, mindfulness, and relaxation exercises such as yoga to help manage her stress Over-the-counter magnesium is a great supplement that helps with anxiety you can take 400 mg daily I have increased your BuSpar to 7.5 mg daily and the prescription is sent to your pharmacy   Please continue to a heart-healthy diet and increase your physical activities. Try to exercise for at least five days a week.      It was a pleasure to see you and I look forward to continuing to work together on your health and well-being. Please do not hesitate to call the office if you need care or have questions about your care.   Have a wonderful day and week. With Gratitude, Gilmore Laroche MSN, FNP-BC

## 2022-11-30 NOTE — Progress Notes (Signed)
Established Patient Office Visit  Subjective:  Patient ID: [name], female    DOB: 06-11-1986  Age: 37 y.o. MRN: [id number]  CC:  Chief Complaint  Patient presents with   Chronic Care Management    1 month f/u, reports back pain x 4 weeks.    Anxiety    Pt reports feeling stressed and anxious , medication has not been helping.     HPI [name] is a 37 y.o. female with past medical history of anxiety, obesity, and hypertension presents for f/u of  chronic medical conditions. For the details of today's visit, please refer to the assessment and plan.     Past Medical History:  Diagnosis Date   GERD (gastroesophageal reflux disease)    Hypertension    No pertinent past medical history    Postpartum pain 06/30/2011   Vaginal delivery, moderate bleeding, tender in abd, esp LLQ    Past Surgical History:  Procedure Laterality Date   BREAST BIOPSY Right 05/20/2022   MM RT BREAST BX W LOC DEV 1ST LESION IMAGE BX SPEC STEREO GUIDE 05/20/2022 [facility]   BREAST BIOPSY  07/15/2022   MM RT RADIOACTIVE SEED LOC MAMMO GUIDE 07/15/2022 [facility]   BREAST LUMPECTOMY WITH RADIOACTIVE SEED LOCALIZATION Right 07/18/2022   Procedure: RIGHT BREAST LUMPECTOMY WITH RADIOACTIVE SEED LOCALIZATION;  Surgeon: Griselda Miner, MD;  Location: [facility] OR;  Service: General;  Laterality: Right;   DILATION AND CURETTAGE OF UTERUS     VAGINAL DELIVERY  06/30/11   Womens    Family History  Problem Relation Age of Onset   Breast cancer Mother    Breast cancer Maternal Grandmother    Anesthesia problems Neg Hx    Hypotension Neg Hx    Malignant hyperthermia Neg Hx    Pseudochol deficiency Neg Hx     Social History   Socioeconomic History   Marital status: Single    Spouse name: Not on file   Number of children: Not on file   Years of education: Not on file   Highest education level: Associate degree: academic program  Occupational History   Not on file  Tobacco Use   Smoking  status: Former    Packs/day: 0.25    Years: 1.00    Additional pack years: 0.00    Total pack years: 0.25    Types: Cigarettes   Smokeless tobacco: Never  Vaping Use   Vaping Use: Never used  Substance and Sexual Activity   Alcohol use: No   Drug use: No   Sexual activity: Not Currently    Birth control/protection: None    Comment: pt is currently pregnant  Other Topics Concern   Not on file  Social History Narrative   Not on file   Social Determinants of Health   Financial Resource Strain: Low Risk  (10/25/2022)   Overall Financial Resource Strain (CARDIA)    Difficulty of Paying Living Expenses: Not hard at all  Food Insecurity: No Food Insecurity (10/25/2022)   Hunger Vital Sign    Worried About Running Out of Food in the Last Year: Never true    Ran Out of Food in the Last Year: Never true  Transportation Needs: No Transportation Needs (10/25/2022)   PRAPARE - Administrator, Civil Service (Medical): No    Lack of Transportation (Non-Medical): No  Physical Activity: Sufficiently Active (10/25/2022)   Exercise Vital Sign    Days of Exercise per Week: 3 days  Minutes of Exercise per Session: 110 min  Stress: Stress Concern Present (10/25/2022)   [employer] of Occupational Health - Occupational Stress Questionnaire    Feeling of Stress : To some extent  Social Connections: Socially Isolated (10/25/2022)   Social Connection and Isolation Panel [NHANES]    Frequency of Communication with Friends and Family: Never    Frequency of Social Gatherings with Friends and Family: Never    Attends Religious Services: Never    Database administrator or Organizations: No    Attends Engineer, structural: Not on file    Marital Status: Never married  Intimate Partner Violence: Not on file    Outpatient Medications Prior to Visit  Medication Sig Dispense Refill   pantoprazole (PROTONIX) 40 MG tablet Take 1 tablet (40 mg total) by mouth daily. 30 tablet 2    telmisartan-hydrochlorothiazide (MICARDIS HCT) 40-12.5 MG tablet Take 1 tablet by mouth daily. 150 tablet 0   Vitamin D, Ergocalciferol, (DRISDOL) 1.25 MG (50000 UNIT) CAPS capsule Take 1 capsule (50,000 Units total) by mouth every 7 (seven) days. 12 capsule 0   busPIRone (BUSPAR) 5 MG tablet Take 1 tablet (5 mg total) by mouth 2 (two) times daily. 60 tablet 1   phentermine 37.5 MG capsule Take 1 capsule (37.5 mg total) by mouth every morning. 30 capsule 0   acidophilus (RISAQUAD) CAPS capsule Take 1 capsule by mouth daily.     Semaglutide-Weight Management (WEGOVY) 0.25 MG/0.5ML SOAJ Inject 0.25 mg into the skin once a week. 2 mL 0   phentermine 30 MG capsule Take 1 capsule (30 mg total) by mouth every morning. 30 capsule 0   No facility-administered medications prior to visit.    No Known Allergies  ROS Review of Systems  Constitutional:  Negative for chills and fever.  Eyes:  Negative for visual disturbance.  Respiratory:  Negative for chest tightness and shortness of breath.   Musculoskeletal:  Positive for back pain.  Neurological:  Negative for dizziness and headaches.  Psychiatric/Behavioral:  Negative for self-injury and suicidal ideas.       Objective:    Physical Exam HENT:     Head: Normocephalic.     Mouth/Throat:     Mouth: Mucous membranes are moist.  Cardiovascular:     Rate and Rhythm: Normal rate.     Heart sounds: Normal heart sounds.  Pulmonary:     Effort: Pulmonary effort is normal.     Breath sounds: Normal breath sounds.  Musculoskeletal:     Lumbar back: Tenderness present.     Comments: Mild tenderness with palpation of the lumbar musculature No overlying skin changes Strength and sensation intact bilaterally Full range of motion of the neck No acute cardiovascular, respiratory, abdominal exam findings     Neurological:     Mental Status: She is alert.     BP 134/87   Pulse 86   Wt (!) 390 lb 0.6 oz (176.9 kg)   SpO2 96%   BMI 61.09  kg/m  Wt Readings from Last 3 Encounters:  11/30/22 (!) 390 lb 0.6 oz (176.9 kg)  10/27/22 (!) 383 lb 0.6 oz (173.7 kg)  07/28/22 (!) 383 lb (173.7 kg)    Lab Results  Component Value Date   TSH 3.960 10/27/2022   Lab Results  Component Value Date   WBC 8.8 10/27/2022   HGB 12.4 10/27/2022   HCT 38.8 10/27/2022   MCV 85 10/27/2022   PLT 246 10/27/2022   Lab Results  Component Value Date   NA 139 10/27/2022   K 4.6 10/27/2022   CO2 23 10/27/2022   GLUCOSE 104 (H) 10/27/2022   BUN 14 10/27/2022   CREATININE 0.85 10/27/2022   BILITOT <0.2 10/27/2022   ALKPHOS 81 10/27/2022   AST 12 10/27/2022   ALT 12 10/27/2022   PROT 6.8 10/27/2022   ALBUMIN 3.8 (L) 10/27/2022   CALCIUM 10.4 (H) 10/27/2022   ANIONGAP 9 07/12/2022   EGFR 90 10/27/2022   Lab Results  Component Value Date   CHOL 170 10/27/2022   Lab Results  Component Value Date   HDL 62 10/27/2022   Lab Results  Component Value Date   LDLCALC 93 10/27/2022   Lab Results  Component Value Date   TRIG 80 10/27/2022   Lab Results  Component Value Date   CHOLHDL 2.7 10/27/2022   Lab Results  Component Value Date   HGBA1C 6.3 (H) 10/27/2022      Assessment & Plan:  GAD (generalized anxiety disorder) Assessment & Plan: GAD-7 is 21 The patient reports increased stress level in her personal life as she has been trying to move out of her grandmother's house and purchase a home She reports minimal relief of her symptoms with BuSpar 5 mg twice daily Will increase BuSpar to 7.5 mg twice daily today Discussed mindfulness, deep breathing exercises, meditation, and relaxation exercises like yoga to decrease stress Patient declines a referral to talk therapy at this time No reports of suicidal thoughts ideation Will follow-up in 4 weeks   Orders: -     busPIRone HCl; Take 1 tablet (7.5 mg total) by mouth 2 (two) times daily.  Dispense: 60 tablet; Refill: 1  Morbid obesity (HCC) Assessment & Plan: Refill  phentermine 37.5 mg Encouraged lifestyle changes with a heart healthy diet and increase physical activity Heart rate and blood pressure stable No arrhythmia or abnormal heart sounds auscultated Wt Readings from Last 3 Encounters:  11/30/22 (!) 390 lb 0.6 oz (176.9 kg)  10/27/22 (!) 383 lb 0.6 oz (173.7 kg)  07/28/22 (!) 383 lb (173.7 kg)     Orders: -     Phentermine HCl; Take 1 capsule (37.5 mg total) by mouth every morning.  Dispense: 30 capsule; Refill: 0  Abnormal uterine bleeding (AUB) Assessment & Plan: Reports irregular menstrual cycles usually last 2 weeks Onset of symptoms for greater than 3 months She reports spotting recently after her cycle will be starting a few weeks A prescription for Megace 20 mg twice daily for 10 days to take at the start of her menses is sent to the pharmacy We will follow-up in 4 weeks  Orders: -     Megestrol Acetate; Take 1 tablet (20 mg total) by mouth 2 (two) times daily for 10 days.  Dispense: 20 tablet; Refill: 0  Lumbar pain Assessment & Plan: No recent injury or trauma reported Likely due to the patient's BMI of 61.09 Complains of increased pain with prolonged standing Pain is noticeable when she lays down at bedtime and when she gets up in the morning Pain is non radiating with no symptoms of bladder or bowel incontinence No recent injury or trauma to the lower back reported No symptoms/sciatica reported She reports minimal relief with NSAIDs and has been taking Tylenol as needed with moderate relief Will provide supportive treatment today and send a prescription for Flexeril 5 mg nightly Encouraged to perform low back exercises as mentioned in her clinical reference attached to the AVS Encouraged application  of heat and cold therapy alternating at the lower back and avoiding aggravating symptoms Will follow-up in 4 weeks  Orders: -     Cyclobenzaprine HCl; Take 1 tablet (5 mg total) by mouth at bedtime.  Dispense: 30 tablet;  Refill: 1   Note: This chart has been completed using Engineer, civil (consulting) software, and while attempts have been made to ensure accuracy, certain words and phrases may not be transcribed as intended.   Follow-up: Return in about 4 weeks (around 12/28/2022).   Gilmore Laroche, FNP

## 2022-11-30 NOTE — Assessment & Plan Note (Signed)
Reports irregular menstrual cycles usually last 2 weeks Onset of symptoms for greater than 3 months She reports spotting recently after her cycle will be starting a few weeks A prescription for Megace 20 mg twice daily for 10 days to take at the start of her menses is sent to the pharmacy We will follow-up in 4 weeks

## 2022-11-30 NOTE — Assessment & Plan Note (Signed)
Refill phentermine 37.5 mg Encouraged lifestyle changes with a heart healthy diet and increase physical activity Heart rate and blood pressure stable No arrhythmia or abnormal heart sounds auscultated Wt Readings from Last 3 Encounters:  11/30/22 (!) 390 lb 0.6 oz (176.9 kg)  10/27/22 (!) 383 lb 0.6 oz (173.7 kg)  07/28/22 (!) 383 lb (173.7 kg)

## 2022-11-30 NOTE — Assessment & Plan Note (Signed)
GAD-7 is 21 The patient reports increased stress level in her personal life as she has been trying to move out of her grandmother's house and purchase a home She reports minimal relief of her symptoms with BuSpar 5 mg twice daily Will increase BuSpar to 7.5 mg twice daily today Discussed mindfulness, deep breathing exercises, meditation, and relaxation exercises like yoga to decrease stress Patient declines a referral to talk therapy at this time No reports of suicidal thoughts ideation Will follow-up in 4 weeks

## 2022-11-30 NOTE — Assessment & Plan Note (Addendum)
No recent injury or trauma reported Likely due to the patient's BMI of 61.09 Complains of increased pain with prolonged standing Pain is noticeable when she lays down at bedtime and when she gets up in the morning Pain is non radiating with no symptoms of bladder or bowel incontinence No recent injury or trauma to the lower back reported No symptoms/sciatica reported She reports minimal relief with NSAIDs and has been taking Tylenol as needed with moderate relief Will provide supportive treatment today and send a prescription for Flexeril 5 mg nightly Encouraged to perform low back exercises as mentioned in her clinical reference attached to the AVS Encouraged application of heat and cold therapy alternating at the lower back and avoiding aggravating symptoms Will follow-up in 4 weeks

## 2022-12-21 ENCOUNTER — Other Ambulatory Visit: Payer: Self-pay

## 2022-12-28 ENCOUNTER — Encounter: Payer: Self-pay | Admitting: Family Medicine

## 2022-12-28 ENCOUNTER — Other Ambulatory Visit: Payer: Self-pay

## 2022-12-28 ENCOUNTER — Ambulatory Visit (INDEPENDENT_AMBULATORY_CARE_PROVIDER_SITE_OTHER): Payer: 59 | Admitting: Family Medicine

## 2022-12-28 ENCOUNTER — Other Ambulatory Visit (HOSPITAL_COMMUNITY): Payer: Self-pay

## 2022-12-28 DIAGNOSIS — M545 Low back pain, unspecified: Secondary | ICD-10-CM

## 2022-12-28 DIAGNOSIS — F411 Generalized anxiety disorder: Secondary | ICD-10-CM

## 2022-12-28 MED ORDER — SERTRALINE HCL 25 MG PO TABS
25.0000 mg | ORAL_TABLET | Freq: Every day | ORAL | 3 refills | Status: DC
Start: 2022-12-28 — End: 2023-05-16
  Filled 2022-12-28: qty 30, 30d supply, fill #0
  Filled 2023-01-30: qty 30, 30d supply, fill #1
  Filled 2023-03-03 – 2023-03-17 (×2): qty 30, 30d supply, fill #2
  Filled 2023-04-19: qty 30, 30d supply, fill #3

## 2022-12-28 MED ORDER — PHENTERMINE HCL 37.5 MG PO CAPS
37.5000 mg | ORAL_CAPSULE | ORAL | 0 refills | Status: DC
Start: 2022-12-28 — End: 2023-01-30
  Filled 2022-12-28: qty 30, 30d supply, fill #0

## 2022-12-28 NOTE — Assessment & Plan Note (Signed)
GAD-7 is 0 Reports not taking BuSpar twice daily Reports forgetting to take her medication twice daily and would like to be on a daily prescription Encouraged to taper off the BuSpar and will start Zoloft 25 mg daily Discussed not taking the patient medication with alcohol as this can increase side effects and worsened anxiety Reviewed serotonin syndrome Encouraged to take Tylenol for pain if needed Discussed nonpharmacological management of anxiety

## 2022-12-28 NOTE — Progress Notes (Signed)
Established Patient Office Visit  Subjective:  Patient ID: [name], female    DOB: [birth year]  Age: 37 y.o. MRN: [id number]  CC:  Chief Complaint  Patient presents with   Chronic Care Management    4 week f/u, pt reports back pain is about the same, pt reports with anxiety she doesn't like taking oral medication and forgets second dose at times, doing well with menstrual cyles.     HPI [name] is a 37 y.o. female with past medical history of generalized anxiety disorder, obesity, and low back pain presents for f/u of  chronic medical conditions  Past Medical History:  Diagnosis Date   GERD (gastroesophageal reflux disease)    Hypertension    No pertinent past medical history    Postpartum pain 06/30/2011   Vaginal delivery, moderate bleeding, tender in abd, esp LLQ    Past Surgical History:  Procedure Laterality Date   BREAST BIOPSY Right 05/20/2022   MM RT BREAST BX W LOC DEV 1ST LESION IMAGE BX SPEC STEREO GUIDE 05/20/2022 [facility]   BREAST BIOPSY  07/15/2022   MM RT RADIOACTIVE SEED LOC MAMMO GUIDE 07/15/2022 [facility]   BREAST LUMPECTOMY WITH RADIOACTIVE SEED LOCALIZATION Right 07/18/2022   Procedure: RIGHT BREAST LUMPECTOMY WITH RADIOACTIVE SEED LOCALIZATION;  Surgeon: Griselda Miner, MD;  Location: [facility];  Service: General;  Laterality: Right;   DILATION AND CURETTAGE OF UTERUS     VAGINAL DELIVERY  06/30/11   Womens    Family History  Problem Relation Age of Onset   Breast cancer Mother    Breast cancer Maternal Grandmother    Anesthesia problems Neg Hx    Hypotension Neg Hx    Malignant hyperthermia Neg Hx    Pseudochol deficiency Neg Hx     Social History   Socioeconomic History   Marital status: Single    Spouse name: Not on file   Number of children: Not on file   Years of education: Not on file   Highest education level: Associate degree: academic program  Occupational History   Not on file  Tobacco Use   Smoking status:  Former    Current packs/day: 0.25    Average packs/day: 0.3 packs/day for 1 year (0.3 ttl pk-yrs)    Types: Cigarettes   Smokeless tobacco: Never  Vaping Use   Vaping status: Never Used  Substance and Sexual Activity   Alcohol use: No   Drug use: No   Sexual activity: Not Currently    Birth control/protection: None    Comment: pt is currently pregnant  Other Topics Concern   Not on file  Social History Narrative   Not on file   Social Determinants of Health   Financial Resource Strain: Low Risk  (10/25/2022)   Overall Financial Resource Strain (CARDIA)    Difficulty of Paying Living Expenses: Not hard at all  Food Insecurity: No Food Insecurity (10/25/2022)   Hunger Vital Sign    Worried About Running Out of Food in the Last Year: Never true    Ran Out of Food in the Last Year: Never true  Transportation Needs: No Transportation Needs (10/25/2022)   PRAPARE - Administrator, Civil Service (Medical): No    Lack of Transportation (Non-Medical): No  Physical Activity: Sufficiently Active (10/25/2022)   Exercise Vital Sign    Days of Exercise per Week: 3 days    Minutes of Exercise per Session: 110 min  Stress: Stress Concern  Present (10/25/2022)   [employer] of Occupational Health - Occupational Stress Questionnaire    Feeling of Stress : To some extent  Social Connections: Socially Isolated (10/25/2022)   Social Connection and Isolation Panel [NHANES]    Frequency of Communication with Friends and Family: Never    Frequency of Social Gatherings with Friends and Family: Never    Attends Religious Services: Never    Database administrator or Organizations: No    Attends Engineer, structural: Not on file    Marital Status: Never married  Intimate Partner Violence: Not on file    Outpatient Medications Prior to Visit  Medication Sig Dispense Refill   busPIRone (BUSPAR) 7.5 MG tablet Take 1 tablet (7.5 mg total) by mouth 2 (two) times daily. 60 tablet  1   cyclobenzaprine (FLEXERIL) 5 MG tablet Take 1 tablet (5 mg total) by mouth at bedtime. 30 tablet 1   pantoprazole (PROTONIX) 40 MG tablet Take 1 tablet (40 mg total) by mouth daily. 30 tablet 2   telmisartan-hydrochlorothiazide (MICARDIS HCT) 40-12.5 MG tablet Take 1 tablet by mouth daily. 150 tablet 0   Vitamin D, Ergocalciferol, (DRISDOL) 1.25 MG (50000 UNIT) CAPS capsule Take 1 capsule (50,000 Units total) by mouth every 7 (seven) days. 12 capsule 0   phentermine 37.5 MG capsule Take 1 capsule (37.5 mg total) by mouth every morning. 30 capsule 0   acidophilus (RISAQUAD) CAPS capsule Take 1 capsule by mouth daily.     Semaglutide-Weight Management (WEGOVY) 0.25 MG/0.5ML SOAJ Inject 0.25 mg into the skin once a week. 2 mL 0   No facility-administered medications prior to visit.    No Known Allergies  ROS Review of Systems  Constitutional:  Negative for chills and fever.  Eyes:  Negative for visual disturbance.  Respiratory:  Negative for chest tightness and shortness of breath.   Neurological:  Negative for dizziness and headaches.      Objective:    Physical Exam HENT:     Head: Normocephalic.     Mouth/Throat:     Mouth: Mucous membranes are moist.  Cardiovascular:     Rate and Rhythm: Normal rate.     Heart sounds: Normal heart sounds.  Pulmonary:     Effort: Pulmonary effort is normal.     Breath sounds: Normal breath sounds.  Neurological:     Mental Status: She is alert.     BP 124/86 (BP Location: Left Arm)   Pulse 78   Ht 5\' 7"  (1.702 m)   Wt (!) 395 lb (179.2 kg)   SpO2 98%   BMI 61.87 kg/m  Wt Readings from Last 3 Encounters:  12/28/22 (!) 395 lb (179.2 kg)  11/30/22 (!) 390 lb 0.6 oz (176.9 kg)  10/27/22 (!) 383 lb 0.6 oz (173.7 kg)    Lab Results  Component Value Date   TSH 3.960 10/27/2022   Lab Results  Component Value Date   WBC 8.8 10/27/2022   HGB 12.4 10/27/2022   HCT 38.8 10/27/2022   MCV 85 10/27/2022   PLT 246 10/27/2022    Lab Results  Component Value Date   NA 139 10/27/2022   K 4.6 10/27/2022   CO2 23 10/27/2022   GLUCOSE 104 (H) 10/27/2022   BUN 14 10/27/2022   CREATININE 0.85 10/27/2022   BILITOT <0.2 10/27/2022   ALKPHOS 81 10/27/2022   AST 12 10/27/2022   ALT 12 10/27/2022   PROT 6.8 10/27/2022   ALBUMIN 3.8 (L) 10/27/2022  CALCIUM 10.4 (H) 10/27/2022   ANIONGAP 9 07/12/2022   EGFR 90 10/27/2022   Lab Results  Component Value Date   CHOL 170 10/27/2022   Lab Results  Component Value Date   HDL 62 10/27/2022   Lab Results  Component Value Date   LDLCALC 93 10/27/2022   Lab Results  Component Value Date   TRIG 80 10/27/2022   Lab Results  Component Value Date   CHOLHDL 2.7 10/27/2022   Lab Results  Component Value Date   HGBA1C 6.3 (H) 10/27/2022      Assessment & Plan:  Morbid obesity (HCC) Assessment & Plan: Reports not taking her medication consistently Encouraged to take her medication as prescribed Refill phentermine 37.5 mg sent to her pharmacy Encouraged to implement lifestyle changes with a heart healthy diet increase physical activity for maximal benefit Patient verbalized understanding  Orders: -     Phentermine HCl; Take 1 capsule (37.5 mg total) by mouth every morning.  Dispense: 30 capsule; Refill: 0  GAD (generalized anxiety disorder) Assessment & Plan: GAD-7 is 0 Reports not taking BuSpar twice daily Reports forgetting to take her medication twice daily and would like to be on a daily prescription Encouraged to taper off the BuSpar and will start Zoloft 25 mg daily Discussed not taking the patient medication with alcohol as this can increase side effects and worsened anxiety Reviewed serotonin syndrome Encouraged to take Tylenol for pain if needed Discussed nonpharmacological management of anxiety   Orders: -     Sertraline HCl; Take 1 tablet (25 mg total) by mouth daily.  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    Follow-up: Return in about 1 month (around 01/28/2023).   Gilmore Laroche, FNP

## 2022-12-28 NOTE — Patient Instructions (Addendum)
I appreciate the opportunity to provide care to you today!    Follow up:  1 months    Nonpharmacologic management of anxiety and depression  Mindfulness and Meditation Practices like mindfulness meditation can help reduce symptoms by promoting relaxation and present-moment awareness.  Exercise  Regular physical activity has been shown to improve mood and reduce anxiety through the release of endorphins and other neurochemicals.  Healthy Diet Eating a balanced diet rich in fruits, vegetables, whole grains, and lean proteins can support overall mental health.  Sleep Hygiene  Establishing a regular sleep routine and ensuring good sleep quality can significantly impact mood and anxiety levels.  Stress Management Techniques Activities such as yoga, tai chi, and deep breathing exercises can help manage stress.  Social Support Maintaining strong relationships and seeking support from friends, family, or support groups can provide emotional comfort and reduce feelings of isolation.  Lifestyle Modifications Reducing alcohol and caffeine intake, quitting smoking, and avoiding recreational drugs can improve symptoms.  Art and Music Therapy Engaging in creative activities like painting, drawing, or playing music can be therapeutic and help express emotions.  Light Therapy Particularly useful for seasonal affective disorder (SAD), exposure to bright light can help regulate mood. .   Please taper off BuSpar for 7 days, once daily, every other day, every 2 days , every 3 days and d/c  Attached with your AVS, you will find valuable resources for self-education. I highly recommend dedicating some time to thoroughly examine them.   Please continue to a heart-healthy diet and increase your physical activities. Try to exercise for at least five days a week.    It was a pleasure to see you and I look forward to continuing to work together on your health and well-being. Please do not hesitate to  call the office if you need care or have questions about your care.  In case of emergency, please visit the Emergency Department for urgent care, or contact our clinic at [phone] to schedule an appointment. We're here to help you!   Have a wonderful day and week. With Gratitude, Gilmore Laroche MSN, FNP-BC

## 2022-12-28 NOTE — Assessment & Plan Note (Signed)
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

## 2022-12-28 NOTE — Assessment & Plan Note (Signed)
Reports not taking her medication consistently Encouraged to take her medication as prescribed Refill phentermine 37.5 mg sent to her pharmacy Encouraged to implement lifestyle changes with a heart healthy diet increase physical activity for maximal benefit Patient verbalized understanding

## 2023-01-02 ENCOUNTER — Other Ambulatory Visit (HOSPITAL_COMMUNITY): Payer: Self-pay

## 2023-01-30 ENCOUNTER — Ambulatory Visit (INDEPENDENT_AMBULATORY_CARE_PROVIDER_SITE_OTHER): Payer: 59 | Admitting: Family Medicine

## 2023-01-30 ENCOUNTER — Other Ambulatory Visit: Payer: Self-pay

## 2023-01-30 ENCOUNTER — Other Ambulatory Visit (HOSPITAL_COMMUNITY): Payer: Self-pay

## 2023-01-30 ENCOUNTER — Encounter: Payer: Self-pay | Admitting: Family Medicine

## 2023-01-30 DIAGNOSIS — M545 Low back pain, unspecified: Secondary | ICD-10-CM | POA: Diagnosis not present

## 2023-01-30 MED ORDER — KETOROLAC TROMETHAMINE 60 MG/2ML IM SOLN
60.0000 mg | Freq: Once | INTRAMUSCULAR | Status: AC
Start: 2023-01-30 — End: 2023-01-30
  Administered 2023-01-30: 60 mg via INTRAMUSCULAR

## 2023-01-30 MED ORDER — CYCLOBENZAPRINE HCL 5 MG PO TABS
5.0000 mg | ORAL_TABLET | Freq: Every day | ORAL | 1 refills | Status: DC
Start: 2023-01-30 — End: 2023-06-11
  Filled 2023-01-30: qty 60, 60d supply, fill #0
  Filled 2023-04-19: qty 60, 60d supply, fill #1

## 2023-01-30 MED ORDER — PHENTERMINE HCL 37.5 MG PO CAPS
37.5000 mg | ORAL_CAPSULE | ORAL | 0 refills | Status: DC
Start: 2023-01-30 — End: 2023-03-03
  Filled 2023-01-30: qty 30, 30d supply, fill #0

## 2023-01-30 NOTE — Assessment & Plan Note (Signed)
Refill phentermine 37.5 mg to take daily Encouraged to continue on lifestyle modification with a heart healthy diet and increase physical therapy Heart rate and blood pressure stable in the clinic today No cardiovascular adverse effects reported Wt Readings from Last 3 Encounters:  01/30/23 (!) 383 lb (173.7 kg)  12/28/22 (!) 395 lb (179.2 kg)  11/30/22 (!) 390 lb 0.6 oz (176.9 kg)

## 2023-01-30 NOTE — Patient Instructions (Addendum)
I appreciate the opportunity to provide care to you today!    Follow up:  1 month  Continue the excellent progress you've made with your weight loss efforts.  For back pain management: -I have refilled Flexeril 5 mg. -I recommend the use of heat and cold therapy. -Adequate rest is advised. -Engaging in stretching exercises can be beneficial. -Activity modification may help in reducing strain and preventing further injury.   Please continue to a heart-healthy diet and increase your physical activities. Try to exercise for at least five days a week.    It was a pleasure to see you and I look forward to continuing to work together on your health and well-being. Please do not hesitate to call the office if you need care or have questions about your care.  In case of emergency, please visit the Emergency Department for urgent care, or contact our clinic at [phone] to schedule an appointment. We're here to help you!   Have a wonderful day and week. With Gratitude, Gilmore Laroche MSN, FNP-BC

## 2023-01-30 NOTE — Progress Notes (Signed)
Established Patient Office Visit  Subjective:  Patient ID: [name], female    DOB: 12-28-1985  Age: 37 y.o. MRN: [id number]  CC:  Chief Complaint  Patient presents with   Obesity    Follow up appointment, pt has lost 12 lbs since last visit.    Care Management    Follow up, pt reports back pain pain is a 9 out of 10    HPI [name] is a 37 y.o. female  presents for obesity follow-up the patient has lost 12 pounds since her last visit.  She reports dietary changes with increased physical activity. For the details of today's visit, please refer to the assessment and plan.    Past Medical History:  Diagnosis Date   GERD (gastroesophageal reflux disease)    Hypertension    No pertinent past medical history    Postpartum pain 06/30/2011   Vaginal delivery, moderate bleeding, tender in abd, esp LLQ    Past Surgical History:  Procedure Laterality Date   BREAST BIOPSY Right 05/20/2022   MM RT BREAST BX W LOC DEV 1ST LESION IMAGE BX SPEC STEREO GUIDE 05/20/2022 [facility]   BREAST BIOPSY  07/15/2022   MM RT RADIOACTIVE SEED LOC MAMMO GUIDE 07/15/2022 [facility]   BREAST LUMPECTOMY WITH RADIOACTIVE SEED LOCALIZATION Right 07/18/2022   Procedure: RIGHT BREAST LUMPECTOMY WITH RADIOACTIVE SEED LOCALIZATION;  Surgeon: Griselda Miner, MD;  Location: [facility] OR;  Service: General;  Laterality: Right;   DILATION AND CURETTAGE OF UTERUS     VAGINAL DELIVERY  06/30/11   Womens    Family History  Problem Relation Age of Onset   Breast cancer Mother    Breast cancer Maternal Grandmother    Anesthesia problems Neg Hx    Hypotension Neg Hx    Malignant hyperthermia Neg Hx    Pseudochol deficiency Neg Hx     Social History   Socioeconomic History   Marital status: Single    Spouse name: Not on file   Number of children: Not on file   Years of education: Not on file   Highest education level: Associate degree: academic program  Occupational History   Not on file   Tobacco Use   Smoking status: Former    Current packs/day: 0.25    Average packs/day: 0.3 packs/day for 1 year (0.3 ttl pk-yrs)    Types: Cigarettes   Smokeless tobacco: Never  Vaping Use   Vaping status: Never Used  Substance and Sexual Activity   Alcohol use: No   Drug use: No   Sexual activity: Not Currently    Birth control/protection: None    Comment: pt is currently pregnant  Other Topics Concern   Not on file  Social History Narrative   Not on file   Social Determinants of Health   Financial Resource Strain: Low Risk  (10/25/2022)   Overall Financial Resource Strain (CARDIA)    Difficulty of Paying Living Expenses: Not hard at all  Food Insecurity: No Food Insecurity (10/25/2022)   Hunger Vital Sign    Worried About Running Out of Food in the Last Year: Never true    Ran Out of Food in the Last Year: Never true  Transportation Needs: No Transportation Needs (10/25/2022)   PRAPARE - Administrator, Civil Service (Medical): No    Lack of Transportation (Non-Medical): No  Physical Activity: Sufficiently Active (10/25/2022)   Exercise Vital Sign    Days of Exercise per Week: 3  days    Minutes of Exercise per Session: 110 min  Stress: Stress Concern Present (10/25/2022)   [employer] of Occupational Health - Occupational Stress Questionnaire    Feeling of Stress : To some extent  Social Connections: Socially Isolated (10/25/2022)   Social Connection and Isolation Panel [NHANES]    Frequency of Communication with Friends and Family: Never    Frequency of Social Gatherings with Friends and Family: Never    Attends Religious Services: Never    Database administrator or Organizations: No    Attends Engineer, structural: Not on file    Marital Status: Never married  Intimate Partner Violence: Not on file    Outpatient Medications Prior to Visit  Medication Sig Dispense Refill   busPIRone (BUSPAR) 7.5 MG tablet Take 1 tablet (7.5 mg total) by  mouth 2 (two) times daily. 60 tablet 1   pantoprazole (PROTONIX) 40 MG tablet Take 1 tablet (40 mg total) by mouth daily. 30 tablet 2   sertraline (ZOLOFT) 25 MG tablet Take 1 tablet (25 mg total) by mouth daily. 30 tablet 3   telmisartan-hydrochlorothiazide (MICARDIS HCT) 40-12.5 MG tablet Take 1 tablet by mouth daily. 150 tablet 0   Vitamin D, Ergocalciferol, (DRISDOL) 1.25 MG (50000 UNIT) CAPS capsule Take 1 capsule (50,000 Units total) by mouth every 7 (seven) days. 12 capsule 0   cyclobenzaprine (FLEXERIL) 5 MG tablet Take 1 tablet (5 mg total) by mouth at bedtime. 30 tablet 1   phentermine 37.5 MG capsule Take 1 capsule (37.5 mg total) by mouth every morning. 30 capsule 0   acidophilus (RISAQUAD) CAPS capsule Take 1 capsule by mouth daily.     Semaglutide-Weight Management (WEGOVY) 0.25 MG/0.5ML SOAJ Inject 0.25 mg into the skin once a week. 2 mL 0   No facility-administered medications prior to visit.    No Known Allergies  ROS Review of Systems  Constitutional:  Negative for chills and fever.  Eyes:  Negative for visual disturbance.  Respiratory:  Negative for chest tightness and shortness of breath.   Musculoskeletal:  Positive for back pain.  Neurological:  Negative for dizziness and headaches.      Objective:    Physical Exam Constitutional:      Appearance: She is obese.  HENT:     Head: Normocephalic.     Mouth/Throat:     Mouth: Mucous membranes are moist.  Cardiovascular:     Rate and Rhythm: Normal rate.     Heart sounds: Normal heart sounds.  Pulmonary:     Effort: Pulmonary effort is normal.     Breath sounds: Normal breath sounds.  Abdominal:     Tenderness: There is no right CVA tenderness or left CVA tenderness.  Musculoskeletal:     Lumbar back: Tenderness present.  Neurological:     Mental Status: She is alert.     BP 131/82   Pulse 83   Ht 5\' 7"  (1.702 m)   Wt (!) 383 lb (173.7 kg)   SpO2 96%   BMI 59.99 kg/m  Wt Readings from Last 3  Encounters:  01/30/23 (!) 383 lb (173.7 kg)  12/28/22 (!) 395 lb (179.2 kg)  11/30/22 (!) 390 lb 0.6 oz (176.9 kg)    Lab Results  Component Value Date   TSH 3.960 10/27/2022   Lab Results  Component Value Date   WBC 8.8 10/27/2022   HGB 12.4 10/27/2022   HCT 38.8 10/27/2022   MCV 85 10/27/2022   PLT  246 10/27/2022   Lab Results  Component Value Date   NA 139 10/27/2022   K 4.6 10/27/2022   CO2 23 10/27/2022   GLUCOSE 104 (H) 10/27/2022   BUN 14 10/27/2022   CREATININE 0.85 10/27/2022   BILITOT <0.2 10/27/2022   ALKPHOS 81 10/27/2022   AST 12 10/27/2022   ALT 12 10/27/2022   PROT 6.8 10/27/2022   ALBUMIN 3.8 (L) 10/27/2022   CALCIUM 10.4 (H) 10/27/2022   ANIONGAP 9 07/12/2022   EGFR 90 10/27/2022   Lab Results  Component Value Date   CHOL 170 10/27/2022   Lab Results  Component Value Date   HDL 62 10/27/2022   Lab Results  Component Value Date   LDLCALC 93 10/27/2022   Lab Results  Component Value Date   TRIG 80 10/27/2022   Lab Results  Component Value Date   CHOLHDL 2.7 10/27/2022   Lab Results  Component Value Date   HGBA1C 6.3 (H) 10/27/2022      Assessment & Plan:  Morbid obesity (HCC) Assessment & Plan: Refill phentermine 37.5 mg to take daily Encouraged to continue on lifestyle modification with a heart healthy diet and increase physical therapy Heart rate and blood pressure stable in the clinic today No cardiovascular adverse effects reported Wt Readings from Last 3 Encounters:  01/30/23 (!) 383 lb (173.7 kg)  12/28/22 (!) 395 lb (179.2 kg)  11/30/22 (!) 390 lb 0.6 oz (176.9 kg)     Orders: -     Phentermine HCl; Take 1 capsule (37.5 mg total) by mouth every morning.  Dispense: 30 capsule; Refill: 0  Lumbar pain Assessment & Plan: Pain is rated 9 out of 10 Pain is described as throbbing Will give Toradol 60 mg IM in the clinic today Flexeril 5 mg refilled recommended the use of heat and cold therapy. Adequate rest is  advised. Engaging in stretching exercises can be beneficial. Activity modification may help in reducing strain and preventing further injury.  Orders: -     Ketorolac Tromethamine -     Cyclobenzaprine HCl; Take 1 tablet (5 mg total) by mouth at bedtime.  Dispense: 60 tablet; Refill: 1  Note: This chart has been completed using Engineer, civil (consulting) software, and while attempts have been made to ensure accuracy, certain words and phrases may not be transcribed as intended.    Follow-up: Return in about 1 month (around 03/02/2023).   Gilmore Laroche, FNP

## 2023-01-30 NOTE — Assessment & Plan Note (Signed)
Pain is rated 9 out of 10 Pain is described as throbbing Will give Toradol 60 mg IM in the clinic today Flexeril 5 mg refilled recommended the use of heat and cold therapy. Adequate rest is advised. Engaging in stretching exercises can be beneficial. Activity modification may help in reducing strain and preventing further injury.

## 2023-03-03 ENCOUNTER — Other Ambulatory Visit: Payer: Self-pay | Admitting: Family Medicine

## 2023-03-03 ENCOUNTER — Other Ambulatory Visit (HOSPITAL_COMMUNITY): Payer: Self-pay

## 2023-03-03 ENCOUNTER — Encounter: Payer: Self-pay | Admitting: Pharmacist

## 2023-03-03 ENCOUNTER — Encounter: Payer: Self-pay | Admitting: Family Medicine

## 2023-03-03 ENCOUNTER — Ambulatory Visit (INDEPENDENT_AMBULATORY_CARE_PROVIDER_SITE_OTHER): Payer: 59 | Admitting: Family Medicine

## 2023-03-03 ENCOUNTER — Other Ambulatory Visit: Payer: Self-pay

## 2023-03-03 DIAGNOSIS — Z713 Dietary counseling and surveillance: Secondary | ICD-10-CM | POA: Diagnosis not present

## 2023-03-03 DIAGNOSIS — I10 Essential (primary) hypertension: Secondary | ICD-10-CM

## 2023-03-03 DIAGNOSIS — Z23 Encounter for immunization: Secondary | ICD-10-CM | POA: Diagnosis not present

## 2023-03-03 DIAGNOSIS — E559 Vitamin D deficiency, unspecified: Secondary | ICD-10-CM

## 2023-03-03 DIAGNOSIS — Z6841 Body Mass Index (BMI) 40.0 and over, adult: Secondary | ICD-10-CM | POA: Diagnosis not present

## 2023-03-03 MED ORDER — TELMISARTAN-HCTZ 40-12.5 MG PO TABS
1.0000 | ORAL_TABLET | Freq: Every day | ORAL | 0 refills | Status: DC
Start: 2023-03-03 — End: 2023-08-28
  Filled 2023-03-03: qty 90, 90d supply, fill #0
  Filled 2023-07-07: qty 30, 30d supply, fill #1
  Filled 2023-08-12: qty 30, 30d supply, fill #2

## 2023-03-03 MED ORDER — VITAMIN D (ERGOCALCIFEROL) 1.25 MG (50000 UNIT) PO CAPS
50000.0000 [IU] | ORAL_CAPSULE | ORAL | 0 refills | Status: DC
  Filled 2023-03-03: qty 12, 84d supply, fill #0

## 2023-03-03 MED ORDER — PHENTERMINE HCL 37.5 MG PO CAPS
37.5000 mg | ORAL_CAPSULE | ORAL | 0 refills | Status: DC
Start: 2023-03-03 — End: 2023-04-03
  Filled 2023-03-03: qty 30, 30d supply, fill #0

## 2023-03-03 NOTE — Progress Notes (Signed)
Established Patient Office Visit  Subjective:  Patient ID: [name], female    DOB: [birth year]  Age: 37 y.o. MRN: [id number]  CC:  Chief Complaint  Patient presents with   Obesity    1 Month f/u    HPI [name] is a 37 y.o. female presents obesity chronic medical conditions.  Obesity:The patient has lost 18 pounds and reports successfully implementing lifestyle changes, including a heart-healthy diet and increased physical activity. She denies experiencing symptoms such as palpitations, elevated blood pressure, or arrhythmias. Additionally, she is tolerating oral phentermine well.  Past Medical History:  Diagnosis Date   GERD (gastroesophageal reflux disease)    Hypertension    No pertinent past medical history    Postpartum pain 06/30/2011   Vaginal delivery, moderate bleeding, tender in abd, esp LLQ    Past Surgical History:  Procedure Laterality Date   BREAST BIOPSY Right 05/20/2022   MM RT BREAST BX W LOC DEV 1ST LESION IMAGE BX SPEC STEREO GUIDE 05/20/2022 [facility]   BREAST BIOPSY  07/15/2022   MM RT RADIOACTIVE SEED LOC MAMMO GUIDE 07/15/2022 [facility]   BREAST LUMPECTOMY WITH RADIOACTIVE SEED LOCALIZATION Right 07/18/2022   Procedure: RIGHT BREAST LUMPECTOMY WITH RADIOACTIVE SEED LOCALIZATION;  Surgeon: Griselda Miner, MD;  Location: [facility];  Service: General;  Laterality: Right;   DILATION AND CURETTAGE OF UTERUS     VAGINAL DELIVERY  06/30/11   Womens    Family History  Problem Relation Age of Onset   Breast cancer Mother    Breast cancer Maternal Grandmother    Anesthesia problems Neg Hx    Hypotension Neg Hx    Malignant hyperthermia Neg Hx    Pseudochol deficiency Neg Hx     Social History   Socioeconomic History   Marital status: Single    Spouse name: Not on file   Number of children: Not on file   Years of education: Not on file   Highest education level: Associate degree: academic program  Occupational History   Not on  file  Tobacco Use   Smoking status: Former    Current packs/day: 0.25    Average packs/day: 0.3 packs/day for 1 year (0.3 ttl pk-yrs)    Types: Cigarettes   Smokeless tobacco: Never  Vaping Use   Vaping status: Never Used  Substance and Sexual Activity   Alcohol use: No   Drug use: No   Sexual activity: Not Currently    Birth control/protection: None    Comment: pt is currently pregnant  Other Topics Concern   Not on file  Social History Narrative   Not on file   Social Determinants of Health   Financial Resource Strain: Low Risk  (10/25/2022)   Overall Financial Resource Strain (CARDIA)    Difficulty of Paying Living Expenses: Not hard at all  Food Insecurity: No Food Insecurity (10/25/2022)   Hunger Vital Sign    Worried About Running Out of Food in the Last Year: Never true    Ran Out of Food in the Last Year: Never true  Transportation Needs: No Transportation Needs (10/25/2022)   PRAPARE - Administrator, Civil Service (Medical): No    Lack of Transportation (Non-Medical): No  Physical Activity: Sufficiently Active (10/25/2022)   Exercise Vital Sign    Days of Exercise per Week: 3 days    Minutes of Exercise per Session: 110 min  Stress: Stress Concern Present (10/25/2022)   [employer] of Occupational  Health - Occupational Stress Questionnaire    Feeling of Stress : To some extent  Social Connections: Socially Isolated (10/25/2022)   Social Connection and Isolation Panel [NHANES]    Frequency of Communication with Friends and Family: Never    Frequency of Social Gatherings with Friends and Family: Never    Attends Religious Services: Never    Database administrator or Organizations: No    Attends Engineer, structural: Not on file    Marital Status: Never married  Intimate Partner Violence: Not on file    Outpatient Medications Prior to Visit  Medication Sig Dispense Refill   busPIRone (BUSPAR) 7.5 MG tablet Take 1 tablet (7.5 mg total) by  mouth 2 (two) times daily. 60 tablet 1   cyclobenzaprine (FLEXERIL) 5 MG tablet Take 1 tablet (5 mg total) by mouth at bedtime. 60 tablet 1   pantoprazole (PROTONIX) 40 MG tablet Take 1 tablet (40 mg total) by mouth daily. 30 tablet 2   sertraline (ZOLOFT) 25 MG tablet Take 1 tablet (25 mg total) by mouth daily. 30 tablet 3   telmisartan-hydrochlorothiazide (MICARDIS HCT) 40-12.5 MG tablet Take 1 tablet by mouth daily. 150 tablet 0   Vitamin D, Ergocalciferol, (DRISDOL) 1.25 MG (50000 UNIT) CAPS capsule Take 1 capsule (50,000 Units total) by mouth every 7 (seven) days. 12 capsule 0   phentermine 37.5 MG capsule Take 1 capsule (37.5 mg total) by mouth every morning. 30 capsule 0   acidophilus (RISAQUAD) CAPS capsule Take 1 capsule by mouth daily.     No facility-administered medications prior to visit.    No Known Allergies  ROS Review of Systems  Constitutional:  Negative for chills and fever.  Eyes:  Negative for visual disturbance.  Respiratory:  Negative for chest tightness and shortness of breath.   Neurological:  Negative for dizziness and headaches.      Objective:    Physical Exam Constitutional:      Appearance: She is obese.  HENT:     Head: Normocephalic.     Mouth/Throat:     Mouth: Mucous membranes are moist.  Cardiovascular:     Rate and Rhythm: Normal rate.     Heart sounds: Normal heart sounds.  Pulmonary:     Effort: Pulmonary effort is normal.     Breath sounds: Normal breath sounds.  Neurological:     Mental Status: She is alert.     BP 135/84   Pulse 87   Ht 5\' 7"  (1.702 m)   Wt (!) 377 lb 1.3 oz (171 kg)   SpO2 96%   BMI 59.06 kg/m  Wt Readings from Last 3 Encounters:  03/03/23 (!) 377 lb 1.3 oz (171 kg)  01/30/23 (!) 383 lb (173.7 kg)  12/28/22 (!) 395 lb (179.2 kg)    Lab Results  Component Value Date   TSH 3.960 10/27/2022   Lab Results  Component Value Date   WBC 8.8 10/27/2022   HGB 12.4 10/27/2022   HCT 38.8 10/27/2022   MCV  85 10/27/2022   PLT 246 10/27/2022   Lab Results  Component Value Date   NA 139 10/27/2022   K 4.6 10/27/2022   CO2 23 10/27/2022   GLUCOSE 104 (H) 10/27/2022   BUN 14 10/27/2022   CREATININE 0.85 10/27/2022   BILITOT <0.2 10/27/2022   ALKPHOS 81 10/27/2022   AST 12 10/27/2022   ALT 12 10/27/2022   PROT 6.8 10/27/2022   ALBUMIN 3.8 (L) 10/27/2022   CALCIUM  10.4 (H) 10/27/2022   ANIONGAP 9 07/12/2022   EGFR 90 10/27/2022   Lab Results  Component Value Date   CHOL 170 10/27/2022   Lab Results  Component Value Date   HDL 62 10/27/2022   Lab Results  Component Value Date   LDLCALC 93 10/27/2022   Lab Results  Component Value Date   TRIG 80 10/27/2022   Lab Results  Component Value Date   CHOLHDL 2.7 10/27/2022   Lab Results  Component Value Date   HGBA1C 6.3 (H) 10/27/2022      Assessment & Plan:  Morbid obesity (HCC) Assessment & Plan: Encouraged to continue her therapy on phentermine 37.5 mg and a refill has been sent to her pharmacy Recommendations for Weight Loss Management:  Emphasize Lifestyle Changes: A heart-healthy diet and increased physical activity are crucial. Healthy Tips for Weight Loss: Increase Intake of Nutrient-Rich Foods: Prioritize fruits, vegetables, and whole grains. Incorporate Lean Proteins: Include chicken, fish, beans, and legumes in your diet. Choose Low-Fat Dairy Products: Opt for dairy products that are low in fat. Reduce Unhealthy Fats: Limit saturated fats, trans fatty acids, and cholesterol. Aim for Regular Physical Activity: Engage in at least 30 minutes of brisk walking or other physical activities on at least 5 days a week.   Orders: -     Phentermine HCl; Take 1 capsule (37.5 mg total) by mouth every morning.  Dispense: 30 capsule; Refill: 0  Encounter for immunization -     Flu vaccine trivalent PF, 6mos and older(Flulaval,Afluria,Fluarix,Fluzone)  Note: This chart has been completed using Engineer, civil (consulting)  software, and while attempts have been made to ensure accuracy, certain words and phrases may not be transcribed as intended.    Follow-up: Return in about 1 month (around 04/02/2023).   Gilmore Laroche, FNP

## 2023-03-03 NOTE — Assessment & Plan Note (Signed)
Encouraged to continue her therapy on phentermine 37.5 mg and a refill has been sent to her pharmacy Recommendations for Weight Loss Management:  Emphasize Lifestyle Changes: A heart-healthy diet and increased physical activity are crucial. Healthy Tips for Weight Loss: Increase Intake of Nutrient-Rich Foods: Prioritize fruits, vegetables, and whole grains. Incorporate Lean Proteins: Include chicken, fish, beans, and legumes in your diet. Choose Low-Fat Dairy Products: Opt for dairy products that are low in fat. Reduce Unhealthy Fats: Limit saturated fats, trans fatty acids, and cholesterol. Aim for Regular Physical Activity: Engage in at least 30 minutes of brisk walking or other physical activities on at least 5 days a week.

## 2023-03-03 NOTE — Patient Instructions (Signed)
I appreciate the opportunity to provide care to you today!    Follow up:  1 months   Recommendations for Weight Loss Management:  Emphasize Lifestyle Changes: A heart-healthy diet and increased physical activity are crucial. Healthy Tips for Weight Loss: Increase Intake of Nutrient-Rich Foods: Prioritize fruits, vegetables, and whole grains. Incorporate Lean Proteins: Include chicken, fish, beans, and legumes in your diet. Choose Low-Fat Dairy Products: Opt for dairy products that are low in fat. Reduce Unhealthy Fats: Limit saturated fats, trans fatty acids, and cholesterol. Aim for Regular Physical Activity: Engage in at least 30 minutes of brisk walking or other physical activities on at least 5 days a week.     Please continue to a heart-healthy diet and increase your physical activities. Try to exercise for at least five days a week.    It was a pleasure to see you and I look forward to continuing to work together on your health and well-being. Please do not hesitate to call the office if you need care or have questions about your care.  In case of emergency, please visit the Emergency Department for urgent care, or contact our clinic at [phone] to schedule an appointment. We're here to help you!   Have a wonderful day and week. With Gratitude, Gilmore Laroche MSN, FNP-BC

## 2023-03-04 ENCOUNTER — Other Ambulatory Visit (HOSPITAL_COMMUNITY): Payer: Self-pay

## 2023-03-07 ENCOUNTER — Other Ambulatory Visit: Payer: Self-pay

## 2023-03-17 ENCOUNTER — Other Ambulatory Visit (HOSPITAL_COMMUNITY): Payer: Self-pay

## 2023-04-03 ENCOUNTER — Encounter: Payer: Self-pay | Admitting: Family Medicine

## 2023-04-03 ENCOUNTER — Other Ambulatory Visit: Payer: Self-pay

## 2023-04-03 ENCOUNTER — Ambulatory Visit (INDEPENDENT_AMBULATORY_CARE_PROVIDER_SITE_OTHER): Payer: 59 | Admitting: Family Medicine

## 2023-04-03 ENCOUNTER — Other Ambulatory Visit (HOSPITAL_COMMUNITY): Payer: Self-pay

## 2023-04-03 DIAGNOSIS — Z6841 Body Mass Index (BMI) 40.0 and over, adult: Secondary | ICD-10-CM | POA: Diagnosis not present

## 2023-04-03 MED ORDER — PHENTERMINE HCL 37.5 MG PO CAPS
37.5000 mg | ORAL_CAPSULE | ORAL | 0 refills | Status: AC
  Filled 2023-04-03 – 2023-05-04 (×3): qty 30, 30d supply, fill #0

## 2023-04-03 NOTE — Assessment & Plan Note (Signed)
The patient reports that she has not been eating healthy lately. She is doing well on phentermine 37.5 mg and would like a medication refill. She denies symptoms of palpitations, high blood pressure, and arrhythmia. A refill has been sent to the pharmacy. The patient is encouraged to implement a heart-healthy diet with increased physical activity.

## 2023-04-03 NOTE — Patient Instructions (Signed)
I appreciate the opportunity to provide care to you today!    Follow up:  1 months   Recommendations for Weight Loss Management:  Emphasize Lifestyle Changes: A heart-healthy diet and increased physical activity are crucial. Healthy Tips for Weight Loss: Increase Intake of Nutrient-Rich Foods: Prioritize fruits, vegetables, and whole grains. Incorporate Lean Proteins: Include chicken, fish, beans, and legumes in your diet. Choose Low-Fat Dairy Products: Opt for dairy products that are low in fat. Reduce Unhealthy Fats: Limit saturated fats, trans fatty acids, and cholesterol. Aim for Regular Physical Activity: Engage in at least 30 minutes of brisk walking or other physical activities on at least 5 days a week.     Please continue to a heart-healthy diet and increase your physical activities. Try to exercise for at least five days a week.    It was a pleasure to see you and I look forward to continuing to work together on your health and well-being. Please do not hesitate to call the office if you need care or have questions about your care.  In case of emergency, please visit the Emergency Department for urgent care, or contact our clinic at [phone] to schedule an appointment. We're here to help you!   Have a wonderful day and week. With Gratitude, Gilmore Laroche MSN, FNP-BC

## 2023-04-03 NOTE — Progress Notes (Signed)
Established Patient Office Visit  Subjective:  Patient ID: [name], female    DOB: 1985-07-14  Age: 37 y.o. MRN: [id number]  CC:  Chief Complaint  Patient presents with   Obesity    1 month f/u    HPI [name] is a 37 y.o. female presents for for obesity follow-up. For the details of today's visit, please refer to the assessment and plan.      Past Medical History:  Diagnosis Date   GERD (gastroesophageal reflux disease)    Hypertension    No pertinent past medical history    Postpartum pain 06/30/2011   Vaginal delivery, moderate bleeding, tender in abd, esp LLQ    Past Surgical History:  Procedure Laterality Date   BREAST BIOPSY Right 05/20/2022   MM RT BREAST BX W LOC DEV 1ST LESION IMAGE BX SPEC STEREO GUIDE 05/20/2022 [facility]   BREAST BIOPSY  07/15/2022   MM RT RADIOACTIVE SEED LOC MAMMO GUIDE 07/15/2022 [facility]   BREAST LUMPECTOMY WITH RADIOACTIVE SEED LOCALIZATION Right 07/18/2022   Procedure: RIGHT BREAST LUMPECTOMY WITH RADIOACTIVE SEED LOCALIZATION;  Surgeon: Griselda Miner, MD;  Location: [facility] OR;  Service: General;  Laterality: Right;   DILATION AND CURETTAGE OF UTERUS     VAGINAL DELIVERY  06/30/11   Womens    Family History  Problem Relation Age of Onset   Breast cancer Mother    Breast cancer Maternal Grandmother    Anesthesia problems Neg Hx    Hypotension Neg Hx    Malignant hyperthermia Neg Hx    Pseudochol deficiency Neg Hx     Social History   Socioeconomic History   Marital status: Single    Spouse name: Not on file   Number of children: Not on file   Years of education: Not on file   Highest education level: Associate degree: academic program  Occupational History   Not on file  Tobacco Use   Smoking status: Former    Current packs/day: 0.25    Average packs/day: 0.3 packs/day for 1 year (0.3 ttl pk-yrs)    Types: Cigarettes   Smokeless tobacco: Never  Vaping Use   Vaping status: Never Used  Substance  and Sexual Activity   Alcohol use: No   Drug use: No   Sexual activity: Not Currently    Birth control/protection: None    Comment: pt is currently pregnant  Other Topics Concern   Not on file  Social History Narrative   Not on file   Social Determinants of Health   Financial Resource Strain: Low Risk  (04/02/2023)   Overall Financial Resource Strain (CARDIA)    Difficulty of Paying Living Expenses: Not hard at all  Food Insecurity: No Food Insecurity (04/02/2023)   Hunger Vital Sign    Worried About Radiation protection practitioner of Food in the Last Year: Never true    Ran Out of Food in the Last Year: Never true  Transportation Needs: No Transportation Needs (04/02/2023)   PRAPARE - Administrator, Civil Service (Medical): No    Lack of Transportation (Non-Medical): No  Physical Activity: Sufficiently Active (04/02/2023)   Exercise Vital Sign    Days of Exercise per Week: 5 days    Minutes of Exercise per Session: 30 min  Stress: No Stress Concern Present (04/02/2023)   [employer] of Occupational Health - Occupational Stress Questionnaire    Feeling of Stress : Only a little  Social Connections: Socially Isolated (04/02/2023)  Social Advertising account executive [NHANES]    Frequency of Communication with Friends and Family: Never    Frequency of Social Gatherings with Friends and Family: Never    Attends Religious Services: Never    Database administrator or Organizations: No    Attends Engineer, structural: Not on file    Marital Status: Never married  Intimate Partner Violence: Not on file    Outpatient Medications Prior to Visit  Medication Sig Dispense Refill   busPIRone (BUSPAR) 7.5 MG tablet Take 1 tablet (7.5 mg total) by mouth 2 (two) times daily. 60 tablet 1   cyclobenzaprine (FLEXERIL) 5 MG tablet Take 1 tablet (5 mg total) by mouth at bedtime. 60 tablet 1   pantoprazole (PROTONIX) 40 MG tablet Take 1 tablet (40 mg total) by mouth daily. 30 tablet  2   sertraline (ZOLOFT) 25 MG tablet Take 1 tablet (25 mg total) by mouth daily. 30 tablet 3   telmisartan-hydrochlorothiazide (MICARDIS HCT) 40-12.5 MG tablet Take 1 tablet by mouth daily. 150 tablet 0   Vitamin D, Ergocalciferol, (DRISDOL) 1.25 MG (50000 UNIT) CAPS capsule Take 1 capsule (50,000 Units total) by mouth every 7 (seven) days. 12 capsule 0   phentermine 37.5 MG capsule Take 1 capsule (37.5 mg total) by mouth every morning. 30 capsule 0   acidophilus (RISAQUAD) CAPS capsule Take 1 capsule by mouth daily.     No facility-administered medications prior to visit.    No Known Allergies  ROS Review of Systems  Constitutional:  Negative for chills and fever.  Eyes:  Negative for visual disturbance.  Respiratory:  Negative for chest tightness and shortness of breath.   Neurological:  Negative for dizziness and headaches.      Objective:    Physical Exam Constitutional:      Appearance: She is obese.  HENT:     Head: Normocephalic.     Mouth/Throat:     Mouth: Mucous membranes are moist.  Cardiovascular:     Rate and Rhythm: Normal rate.     Heart sounds: Normal heart sounds.  Pulmonary:     Effort: Pulmonary effort is normal.     Breath sounds: Normal breath sounds.  Neurological:     Mental Status: She is alert.     BP 134/82   Pulse 75   Ht 5\' 7"  (1.702 m)   Wt (!) 384 lb 1.3 oz (174.2 kg)   SpO2 98%   BMI 60.16 kg/m  Wt Readings from Last 3 Encounters:  04/03/23 (!) 384 lb 1.3 oz (174.2 kg)  03/03/23 (!) 377 lb 1.3 oz (171 kg)  01/30/23 (!) 383 lb (173.7 kg)    Lab Results  Component Value Date   TSH 3.960 10/27/2022   Lab Results  Component Value Date   WBC 8.8 10/27/2022   HGB 12.4 10/27/2022   HCT 38.8 10/27/2022   MCV 85 10/27/2022   PLT 246 10/27/2022   Lab Results  Component Value Date   NA 139 10/27/2022   K 4.6 10/27/2022   CO2 23 10/27/2022   GLUCOSE 104 (H) 10/27/2022   BUN 14 10/27/2022   CREATININE 0.85 10/27/2022    BILITOT <0.2 10/27/2022   ALKPHOS 81 10/27/2022   AST 12 10/27/2022   ALT 12 10/27/2022   PROT 6.8 10/27/2022   ALBUMIN 3.8 (L) 10/27/2022   CALCIUM 10.4 (H) 10/27/2022   ANIONGAP 9 07/12/2022   EGFR 90 10/27/2022   Lab Results  Component Value Date  CHOL 170 10/27/2022   Lab Results  Component Value Date   HDL 62 10/27/2022   Lab Results  Component Value Date   LDLCALC 93 10/27/2022   Lab Results  Component Value Date   TRIG 80 10/27/2022   Lab Results  Component Value Date   CHOLHDL 2.7 10/27/2022   Lab Results  Component Value Date   HGBA1C 6.3 (H) 10/27/2022      Assessment & Plan:  Morbid obesity (HCC) Assessment & Plan: The patient reports that she has not been eating healthy lately. She is doing well on phentermine 37.5 mg and would like a medication refill. She denies symptoms of palpitations, high blood pressure, and arrhythmia. A refill has been sent to the pharmacy. The patient is encouraged to implement a heart-healthy diet with increased physical activity.   Orders: -     Phentermine HCl; Take 1 capsule (37.5 mg total) by mouth every morning.  Dispense: 30 capsule; Refill: 0   Note: This chart has been completed using Engineer, civil (consulting) software, and while attempts have been made to ensure accuracy, certain words and phrases may not be transcribed as intended.   Follow-up: Return in about 1 month (around 05/04/2023).   Gilmore Laroche, FNP

## 2023-04-04 ENCOUNTER — Other Ambulatory Visit: Payer: Self-pay

## 2023-04-04 ENCOUNTER — Encounter: Payer: Self-pay | Admitting: Pharmacist

## 2023-04-05 ENCOUNTER — Other Ambulatory Visit: Payer: Self-pay

## 2023-04-19 ENCOUNTER — Other Ambulatory Visit: Payer: Self-pay

## 2023-04-19 ENCOUNTER — Other Ambulatory Visit (HOSPITAL_COMMUNITY): Payer: Self-pay

## 2023-04-20 ENCOUNTER — Encounter: Payer: Self-pay | Admitting: Pharmacist

## 2023-04-20 ENCOUNTER — Other Ambulatory Visit: Payer: Self-pay

## 2023-04-25 ENCOUNTER — Other Ambulatory Visit: Payer: Self-pay

## 2023-05-04 ENCOUNTER — Other Ambulatory Visit (HOSPITAL_COMMUNITY): Payer: Self-pay

## 2023-05-05 ENCOUNTER — Ambulatory Visit: Payer: 59 | Admitting: Family Medicine

## 2023-05-16 ENCOUNTER — Other Ambulatory Visit: Payer: Self-pay

## 2023-05-16 ENCOUNTER — Ambulatory Visit (INDEPENDENT_AMBULATORY_CARE_PROVIDER_SITE_OTHER): Payer: 59 | Admitting: Family Medicine

## 2023-05-16 ENCOUNTER — Other Ambulatory Visit (HOSPITAL_COMMUNITY): Payer: Self-pay

## 2023-05-16 ENCOUNTER — Encounter: Payer: Self-pay | Admitting: Family Medicine

## 2023-05-16 VITALS — BP 135/84 | HR 92 | Ht 67.0 in | Wt 396.0 lb

## 2023-05-16 DIAGNOSIS — Z6841 Body Mass Index (BMI) 40.0 and over, adult: Secondary | ICD-10-CM

## 2023-05-16 DIAGNOSIS — F411 Generalized anxiety disorder: Secondary | ICD-10-CM

## 2023-05-16 DIAGNOSIS — R1084 Generalized abdominal pain: Secondary | ICD-10-CM

## 2023-05-16 MED ORDER — SERTRALINE HCL 50 MG PO TABS
50.0000 mg | ORAL_TABLET | Freq: Every day | ORAL | 3 refills | Status: DC
Start: 2023-05-16 — End: 2023-09-29
  Filled 2023-05-16: qty 30, 30d supply, fill #0
  Filled 2023-07-07: qty 30, 30d supply, fill #1
  Filled 2023-08-12: qty 30, 30d supply, fill #2
  Filled 2023-09-17 – 2023-09-19 (×2): qty 30, 30d supply, fill #3

## 2023-05-16 NOTE — Progress Notes (Signed)
Established Patient Office Visit  Subjective:  Patient ID: [name], female    DOB: 1986/06/01  Age: 37 y.o. MRN: [id number]  CC:  Chief Complaint  Patient presents with   Care Management    1 month f/u, reports stomach pain has got worse    HPI [name] is a 37 y.o. female  presents for for obesity f/u with complaints of generalized abdominal pain. For the details of today's visit, please refer to the assessment and plan.   Past Medical History:  Diagnosis Date   GERD (gastroesophageal reflux disease)    Hypertension    No pertinent past medical history    Postpartum pain 06/30/2011   Vaginal delivery, moderate bleeding, tender in abd, esp LLQ    Past Surgical History:  Procedure Laterality Date   BREAST BIOPSY Right 05/20/2022   MM RT BREAST BX W LOC DEV 1ST LESION IMAGE BX SPEC STEREO GUIDE 05/20/2022 [facility]   BREAST BIOPSY  07/15/2022   MM RT RADIOACTIVE SEED LOC MAMMO GUIDE 07/15/2022 [facility]   BREAST LUMPECTOMY WITH RADIOACTIVE SEED LOCALIZATION Right 07/18/2022   Procedure: RIGHT BREAST LUMPECTOMY WITH RADIOACTIVE SEED LOCALIZATION;  Surgeon: Griselda Miner, MD;  Location: [facility];  Service: General;  Laterality: Right;   DILATION AND CURETTAGE OF UTERUS     VAGINAL DELIVERY  06/30/11   Womens    Family History  Problem Relation Age of Onset   Breast cancer Mother    Breast cancer Maternal Grandmother    Anesthesia problems Neg Hx    Hypotension Neg Hx    Malignant hyperthermia Neg Hx    Pseudochol deficiency Neg Hx     Social History   Socioeconomic History   Marital status: Single    Spouse name: Not on file   Number of children: Not on file   Years of education: Not on file   Highest education level: Associate degree: academic program  Occupational History   Not on file  Tobacco Use   Smoking status: Former    Current packs/day: 0.25    Average packs/day: 0.3 packs/day for 1 year (0.3 ttl pk-yrs)    Types: Cigarettes    Smokeless tobacco: Never  Vaping Use   Vaping status: Never Used  Substance and Sexual Activity   Alcohol use: No   Drug use: No   Sexual activity: Not Currently    Birth control/protection: None    Comment: pt is currently pregnant  Other Topics Concern   Not on file  Social History Narrative   Not on file   Social Determinants of Health   Financial Resource Strain: Low Risk  (04/02/2023)   Overall Financial Resource Strain (CARDIA)    Difficulty of Paying Living Expenses: Not hard at all  Food Insecurity: No Food Insecurity (04/02/2023)   Hunger Vital Sign    Worried About Radiation protection practitioner of Food in the Last Year: Never true    Ran Out of Food in the Last Year: Never true  Transportation Needs: No Transportation Needs (04/02/2023)   PRAPARE - Administrator, Civil Service (Medical): No    Lack of Transportation (Non-Medical): No  Physical Activity: Sufficiently Active (04/02/2023)   Exercise Vital Sign    Days of Exercise per Week: 5 days    Minutes of Exercise per Session: 30 min  Stress: No Stress Concern Present (04/02/2023)   [employer] of Occupational Health - Occupational Stress Questionnaire    Feeling of Stress :  Only a little  Social Connections: Socially Isolated (04/02/2023)   Social Connection and Isolation Panel [NHANES]    Frequency of Communication with Friends and Family: Never    Frequency of Social Gatherings with Friends and Family: Never    Attends Religious Services: Never    Database administrator or Organizations: No    Attends Engineer, structural: Not on file    Marital Status: Never married  Intimate Partner Violence: Not on file    Outpatient Medications Prior to Visit  Medication Sig Dispense Refill   busPIRone (BUSPAR) 7.5 MG tablet Take 1 tablet (7.5 mg total) by mouth 2 (two) times daily. 60 tablet 1   cyclobenzaprine (FLEXERIL) 5 MG tablet Take 1 tablet (5 mg total) by mouth at bedtime. 60 tablet 1   phentermine  37.5 MG capsule Take 1 capsule (37.5 mg total) by mouth every morning. 30 capsule 0   telmisartan-hydrochlorothiazide (MICARDIS HCT) 40-12.5 MG tablet Take 1 tablet by mouth daily. 150 tablet 0   Vitamin D, Ergocalciferol, (DRISDOL) 1.25 MG (50000 UNIT) CAPS capsule Take 1 capsule (50,000 Units total) by mouth every 7 (seven) days. 12 capsule 0   sertraline (ZOLOFT) 25 MG tablet Take 1 tablet (25 mg total) by mouth daily. 30 tablet 3   acidophilus (RISAQUAD) CAPS capsule Take 1 capsule by mouth daily.     pantoprazole (PROTONIX) 40 MG tablet Take 1 tablet (40 mg total) by mouth daily. 30 tablet 2   No facility-administered medications prior to visit.    No Known Allergies  ROS Review of Systems  Constitutional:  Negative for chills and fever.  Eyes:  Negative for visual disturbance.  Respiratory:  Negative for chest tightness and shortness of breath.   Gastrointestinal:  Positive for abdominal pain.  Neurological:  Negative for dizziness and headaches.      Objective:    Physical Exam HENT:     Head: Normocephalic.     Mouth/Throat:     Mouth: Mucous membranes are moist.  Cardiovascular:     Rate and Rhythm: Normal rate.     Heart sounds: Normal heart sounds.  Pulmonary:     Effort: Pulmonary effort is normal.     Breath sounds: Normal breath sounds.  Abdominal:     Palpations: There is no mass.     Tenderness: There is no abdominal tenderness. There is no right CVA tenderness, left CVA tenderness, guarding or rebound.  Neurological:     Mental Status: She is alert.     BP 135/84   Pulse 92   Ht 5\' 7"  (1.702 m)   Wt (!) 396 lb 0.6 oz (179.6 kg)   SpO2 97%   BMI 62.03 kg/m  Wt Readings from Last 3 Encounters:  05/16/23 (!) 396 lb 0.6 oz (179.6 kg)  04/03/23 (!) 384 lb 1.3 oz (174.2 kg)  03/03/23 (!) 377 lb 1.3 oz (171 kg)    Lab Results  Component Value Date   TSH 3.960 10/27/2022   Lab Results  Component Value Date   WBC 8.8 10/27/2022   HGB 12.4  10/27/2022   HCT 38.8 10/27/2022   MCV 85 10/27/2022   PLT 246 10/27/2022   Lab Results  Component Value Date   NA 139 10/27/2022   K 4.6 10/27/2022   CO2 23 10/27/2022   GLUCOSE 104 (H) 10/27/2022   BUN 14 10/27/2022   CREATININE 0.85 10/27/2022   BILITOT <0.2 10/27/2022   ALKPHOS 81 10/27/2022   AST  12 10/27/2022   ALT 12 10/27/2022   PROT 6.8 10/27/2022   ALBUMIN 3.8 (L) 10/27/2022   CALCIUM 10.4 (H) 10/27/2022   ANIONGAP 9 07/12/2022   EGFR 90 10/27/2022   Lab Results  Component Value Date   CHOL 170 10/27/2022   Lab Results  Component Value Date   HDL 62 10/27/2022   Lab Results  Component Value Date   LDLCALC 93 10/27/2022   Lab Results  Component Value Date   TRIG 80 10/27/2022   Lab Results  Component Value Date   CHOLHDL 2.7 10/27/2022   Lab Results  Component Value Date   HGBA1C 6.3 (H) 10/27/2022      Assessment & Plan:  Generalized abdominal pain Assessment & Plan: The patient complains of generalized abdominal pain, which has worsened and increased in severity. She rates her pain as 12 out of 10, noting that her symptoms are intermittent and last about 15 minutes. She describes a sensation of something twisting in her abdomen, with the pain radiating from her right flank to her abdomen and intensifying with certain movements. The patient denies fever, chills, changes in bowel or bladder habits, nausea, vomiting, diarrhea, or urinary complaints.  She reports that the pain with deep breathing and rest. Imaging completed on 07/06/2022 was unremarkable, but we will repeat the imaging study to compare findings given the increased severity of her symptoms. A referral to GI has been placed for collaborative care.  I reviewed nonpharmacological management strategies, including the application of warm compresses or a heating pad to the abdomen to help relax the muscles and reduce pain, staying hydrated with at least 64 ounces of fluid daily, avoiding heavy,  greasy, or spicy foods, reducing stress and anxiety, and considering probiotics for digestive health.  The patient verbalized understanding and is aware of the plan of care.   Orders: -     Ambulatory referral to Gastroenterology -     US Abdomen Complete  GAD (generalized anxiety disorder) Assessment & Plan: The patient complains that her anxiety has intensified in the last few weeks due to increased stress. She reports being out of her Zoloft for about 2 weeks. She denies suicidal thoughts or ideation. We will increase the dose of Zoloft to 50 mg daily and follow up in 6 weeks to assess her progress.  I reviewed nonpharmacological management strategies with her, including mindfulness, deep breathing exercises, journaling, adhering to a heart-healthy diet, ensuring adequate rest, decreasing stress, and increasing physical activity to help manage anxiety.    Orders: -     Sertraline HCl; Take 1 tablet (50 mg total) by mouth daily.  Dispense: 30 tablet; Refill: 3  Morbid obesity (HCC) Assessment & Plan: The patient has been on phentermine for the past 6 months with no significant changes in her weight. We will discontinue therapy today, as prolonged use of phentermine can contribute to cardiovascular changes. I informed the patient of the plan of care and encouraged her to implement lifestyle changes, including following a heart-healthy diet and increasing physical activity. The patient is aware of the plan of care and verbalized understanding. Wt Readings from Last 3 Encounters:  05/16/23 (!) 396 lb 0.6 oz (179.6 kg)  04/03/23 (!) 384 lb 1.3 oz (174.2 kg)  03/03/23 (!) 377 lb 1.3 oz (171 kg)      Note: This chart has been completed using Engineer, civil (consulting) software, and while attempts have been made to ensure accuracy, certain words and phrases may not be transcribed  as intended.    Follow-up: Return in about 6 weeks (around 06/27/2023).   Gilmore Laroche, FNP

## 2023-05-16 NOTE — Assessment & Plan Note (Signed)
The patient has been on phentermine for the past 6 months with no significant changes in her weight. We will discontinue therapy today, as prolonged use of phentermine can contribute to cardiovascular changes. I informed the patient of the plan of care and encouraged her to implement lifestyle changes, including following a heart-healthy diet and increasing physical activity. The patient is aware of the plan of care and verbalized understanding. Wt Readings from Last 3 Encounters:  05/16/23 (!) 396 lb 0.6 oz (179.6 kg)  04/03/23 (!) 384 lb 1.3 oz (174.2 kg)  03/03/23 (!) 377 lb 1.3 oz (171 kg)

## 2023-05-16 NOTE — Assessment & Plan Note (Addendum)
The patient complains of generalized abdominal pain, which has worsened and increased in severity. She rates her pain as 12 out of 10, noting that her symptoms are intermittent and last about 15 minutes. She describes a sensation of something twisting in her abdomen, with the pain radiating from her right flank to her abdomen and intensifying with certain movements. The patient denies fever, chills, changes in bowel or bladder habits, nausea, vomiting, diarrhea, or urinary complaints.  She reports that the pain with deep breathing and rest. Imaging completed on 07/06/2022 was unremarkable, but we will repeat the imaging study to compare findings given the increased severity of her symptoms. A referral to GI has been placed for collaborative care.  I reviewed nonpharmacological management strategies, including the application of warm compresses or a heating pad to the abdomen to help relax the muscles and reduce pain, staying hydrated with at least 64 ounces of fluid daily, avoiding heavy, greasy, or spicy foods, reducing stress and anxiety, and considering probiotics for digestive health.  The patient verbalized understanding and is aware of the plan of care.

## 2023-05-16 NOTE — Patient Instructions (Addendum)
I appreciate the opportunity to provide care to you today!    Follow up: 6 weeks for anxiety  Anxiety: A prescription for Zoloft 50 mg daily has been sent to your pharmacy. I recommend nonpharmacological interventions such as mindfulness, deep breathing exercises, journaling, adhering to a heart-healthy diet, ensuring adequate rest, decreasing stress, and increasing physical activity to help manage anxiety.  Generalized Abdominal Pain: Please stop by [facility] to get an ultrasound of your abdomen to assess for any underlying pathology. I also recommend following nonpharmacological interventions for generalized abdominal pain, such as hydration, heat therapy, and stress management techniques.  Nonpharmacological interventions for generalized abdominal pain focus on relieving discomfort and addressing potential underlying causes. These strategies can be used alongside medical treatment and may help manage symptoms:  Heat Therapy: Applying a warm compress or heating pad to the abdomen can help relax the muscles and reduce pain, especially if the pain is related to muscle spasms or bloating.  Hydration: Drinking plenty of water can help alleviate pain associated with dehydration, constipation, or digestive issues. Warm fluids, like herbal teas (e.g., peppermint or ginger tea), may be particularly soothing.  Dietary Modifications: Avoiding heavy, greasy, or spicy foods can prevent irritation of the stomach and intestines. A bland diet (e.g., the BRAT diet--bananas, rice, applesauce, and toast) may help reduce irritation.  Gentle Movement or Rest: Light physical activity, such as walking, can sometimes help alleviate bloating and discomfort caused by gas. However, rest may be needed if the pain is severe or associated with nausea.  Stress Reduction: Since stress and anxiety can contribute to abdominal pain, engaging in relaxation techniques like deep breathing, mindfulness meditation, or  progressive muscle relaxation can help reduce tension and discomfort.  Positioning: Lying on your side in a fetal position can sometimes relieve abdominal cramping or gas pain by applying gentle pressure to the abdomen.  Probiotics: If the pain is related to digestive issues, probiotics might help restore balance to the gut flora, improving digestion and reducing discomfort from bloating or irregular bowel movements.  Massage: Gentle abdominal massage in a clockwise direction may help relieve tension, improve digestion, and reduce bloating.  Avoiding Triggers: If specific foods or drinks (such as caffeine, alcohol, or dairy) exacerbate the pain, it's helpful to avoid these triggers.  Breathing Exercises: Slow, deep breaths can help calm the nervous system and may alleviate pain that is exacerbated by stress or anxiety.   Referrals today-  GI    Please continue to a heart-healthy diet and increase your physical activities. Try to exercise for at least five days a week.    It was a pleasure to see you and I look forward to continuing to work together on your health and well-being. Please do not hesitate to call the office if you need care or have questions about your care.  In case of emergency, please visit the Emergency Department for urgent care, or contact our clinic at [phone] to schedule an appointment. We're here to help you!   Have a wonderful day and week. With Gratitude, Gilmore Laroche MSN, FNP-BC

## 2023-05-16 NOTE — Assessment & Plan Note (Signed)
The patient complains that her anxiety has intensified in the last few weeks due to increased stress. She reports being out of her Zoloft for about 2 weeks. She denies suicidal thoughts or ideation. We will increase the dose of Zoloft to 50 mg daily and follow up in 6 weeks to assess her progress.  I reviewed nonpharmacological management strategies with her, including mindfulness, deep breathing exercises, journaling, adhering to a heart-healthy diet, ensuring adequate rest, decreasing stress, and increasing physical activity to help manage anxiety.

## 2023-05-18 ENCOUNTER — Ambulatory Visit (HOSPITAL_COMMUNITY)
Admission: RE | Admit: 2023-05-18 | Discharge: 2023-05-18 | Disposition: A | Discharge status: Home / Self Care | Payer: 59 | Source: Ambulatory Visit | Attending: Family Medicine | Admitting: Family Medicine

## 2023-05-18 DIAGNOSIS — R1084 Generalized abdominal pain: Secondary | ICD-10-CM | POA: Diagnosis not present

## 2023-05-18 DIAGNOSIS — R109 Unspecified abdominal pain: Secondary | ICD-10-CM | POA: Diagnosis not present

## 2023-05-18 NOTE — Progress Notes (Signed)
Referring Provider: Gilmore Laroche, FNP Primary Care Physician:  Gilmore Laroche, FNP Primary Gastroenterologist:  Dr. Marletta Lor  Chief Complaint  Patient presents with   Abdominal Pain    Going on for a 1 year. Pain is getting worse. No nausea or vomiting.    HPI:   [name] is a 37 y.o. female presenting today at the request of  Gilmore Laroche, FNP for generalized abdominal pain.     Patient's saw  Gilmore Laroche, FNP 05/16/2023 and complained of generalized abdominal pain that had been worsening, reported as 12 out of 10 in severity, intermittent, lasting about 15 minutes.  Pain would radiate from right flank to abdomen and intensify with certain movements.  Denied nausea, vomiting, diarrhea.  Prior ultrasound in January for similar symptoms showed a small amount of sludge in the gallbladder lumen, mild fatty liver.  She was ordered to have a repeat ultrasound 12/3 which was completed on 12/5 showing no gallstones or wall thickening, fatty liver.  She was referred to GI for further evaluation.  Today: About 1 year ago, started having right flank pain, but states pain has moved to her right upper quadrant/epigastric area.  Over the last 2 weeks, symptoms have been worsening.  Reports she always has pain in the upper abdomen to some degree, but she has intermittent acute worsening of her pain that is sharp and like spasms that come and go for 15 minutes.  This seems to occur with certain movements such as turning over in bed onto her left side, reaching to scratch her knee, or maybe having to adjust herself in the car.  Abdominal pain is not affected by meals.  No associated nausea or vomiting.  Initially with right flank pain, but now moved over to RUQ and epigastric area. Worsens with vertain movement. If turning over to left side or reaching to scratch her knee. Or maybe has to adjust herself in the car. Not affected by meals. No nausea or vomiting.   Has tried ibuprofen and  tylenol. Took ibuprofen 1-2 times a day for about 1 week or so. Doesn't usually take ibuprofen.   No brbpr, melena, change in bowel habits. No heartburn, GERD, dysphagia. Tried Protonix 20 mg but this didn't help.   Chronic lower back pain. No urinary symptoms. Hasn't fallen or injured her self in any way that she can remember. 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.    Past Surgical History:  Procedure Laterality Date   BREAST BIOPSY Right 05/20/2022   MM RT BREAST BX W LOC DEV 1ST LESION IMAGE BX SPEC STEREO GUIDE 05/20/2022 [facility]   BREAST BIOPSY  07/15/2022   MM RT RADIOACTIVE SEED LOC MAMMO GUIDE 07/15/2022 [facility]   BREAST LUMPECTOMY WITH RADIOACTIVE SEED LOCALIZATION Right 07/18/2022   Procedure: RIGHT BREAST LUMPECTOMY WITH RADIOACTIVE SEED LOCALIZATION;  Surgeon: Griselda Miner, MD;  Location: [facility] OR;  Service: General;  Laterality: Right;   DILATION AND CURETTAGE OF UTERUS     oophrectomy and salpingectomy Left    VAGINAL DELIVERY  06/30/2011   Womens    Current Outpatient Medications  Medication Sig Dispense Refill   cyclobenzaprine (FLEXERIL) 5 MG tablet Take 1 tablet (5 mg total) by mouth at bedtime. 60 tablet 1   pantoprazole (PROTONIX) 40 MG tablet Take 1 tablet (40 mg total) by mouth daily before breakfast. 30 tablet 3   phentermine 37.5 MG capsule Take 1 capsule (37.5 mg total) by mouth  every morning. 30 capsule 0   sertraline (ZOLOFT) 50 MG tablet Take 1 tablet (50 mg total) by mouth daily. 30 tablet 3   telmisartan-hydrochlorothiazide (MICARDIS HCT) 40-12.5 MG tablet Take 1 tablet by mouth daily. 150 tablet 0   Vitamin D, Ergocalciferol, (DRISDOL) 1.25 MG (50000 UNIT) CAPS capsule Take 1 capsule (50,000 Units total) by mouth every 7 (seven) days. 12 capsule 0   No current facility-administered medications for this visit.    Allergies as of 05/19/2023   (No Known Allergies)    Family History  Problem Relation Age of Onset    Breast cancer Mother    Breast cancer Maternal Grandmother    Anesthesia problems Neg Hx    Hypotension Neg Hx    Malignant hyperthermia Neg Hx    Pseudochol deficiency Neg Hx    Colon cancer Neg Hx     Social History   Socioeconomic History   Marital status: Single    Spouse name: Not on file   Number of children: Not on file   Years of education: Not on file   Highest education level: Associate degree: academic program  Occupational History   Not on file  Tobacco Use   Smoking status: Former    Current packs/day: 0.25    Average packs/day: 0.3 packs/day for 1 year (0.3 ttl pk-yrs)    Types: Cigarettes   Smokeless tobacco: Never  Vaping Use   Vaping status: Never Used  Substance and Sexual Activity   Alcohol use: Yes    Comment: every now and then   Drug use: No   Sexual activity: Not Currently    Birth control/protection: None    Comment: pt is currently pregnant  Other Topics Concern   Not on file  Social History Narrative   Not on file   Social Determinants of Health   Financial Resource Strain: Low Risk  (04/02/2023)   Overall Financial Resource Strain (CARDIA)    Difficulty of Paying Living Expenses: Not hard at all  Food Insecurity: No Food Insecurity (04/02/2023)   Hunger Vital Sign    Worried About Running Out of Food in the Last Year: Never true    Ran Out of Food in the Last Year: Never true  Transportation Needs: No Transportation Needs (04/02/2023)   PRAPARE - Administrator, Civil Service (Medical): No    Lack of Transportation (Non-Medical): No  Physical Activity: Sufficiently Active (04/02/2023)   Exercise Vital Sign    Days of Exercise per Week: 5 days    Minutes of Exercise per Session: 30 min  Stress: No Stress Concern Present (04/02/2023)   [employer] of Occupational Health - Occupational Stress Questionnaire    Feeling of Stress : Only a little  Social Connections: Socially Isolated (04/02/2023)   Social Connection and  Isolation Panel [NHANES]    Frequency of Communication with Friends and Family: Never    Frequency of Social Gatherings with Friends and Family: Never    Attends Religious Services: Never    Database administrator or Organizations: No    Attends Engineer, structural: Not on file    Marital Status: Never married  Intimate Partner Violence: Not on file    Review of Systems: Gen: Denies any fever, chills, cold or flu like symptoms, pre-syncope, or syncope.  CV: Denies chest pain, heart palpitations. Resp: Denies shortness of breath, cough.  GI: See HPI GU : Denies urinary burning, urinary frequency, urinary hesitancy MS: Admits to chronic  back pain.   Derm: Denies rash. Psych: Denies depression, anxiety. Heme: See HPI  Physical Exam: BP (!) 136/91 (BP Location: Right Arm, Patient Position: Sitting, Cuff Size: Large)   Pulse 74   Temp (!) 97.3 F (36.3 C) (Temporal)   Ht 5\' 6"  (1.676 m)   Wt (!) 390 lb 6.4 oz (177.1 kg)   BMI 63.01 kg/m  General:   Alert and oriented. Pleasant and cooperative. Well-nourished and well-developed.  Head:  Normocephalic and atraumatic. Eyes:  Without icterus, sclera clear and conjunctiva pink.  Ears:  Normal auditory acuity. Lungs:  Clear to auscultation bilaterally. No wheezes, rales, or rhonchi. No distress.  Heart:  S1, S2 present without murmurs appreciated.  Abdomen:  +BS, obese, soft. Mild TTP across entire upper abdomen at the level just below costal margin. No HSM noted. No guarding or rebound. No masses appreciated.  Rectal:  Deferred  Msk:  Symmetrical without gross deformities. Normal posture. Extremities:  Without edema. Neurologic:  Alert and  oriented x4;  grossly normal neurologically. Skin:  Intact without significant lesions or rashes. Psych: Normal mood and affect.    Assessment:  37 year old female with history of HTN, chronic low back pain, obesity, presenting today at the request of Gilmore Laroche, FNP for further  evaluation of upper abdominal pain.  Patient reports pain initially started about 1 year ago with right flank, but has become localized in the upper abdomen, primarily epigastric and RUQ region that is constant to some degree.  Intermittent severe worsening with certain movements, but denies any association with meals and nausea, vomiting.  No regular NSAID use though she did try ibuprofen for 1 week with no improvement.  Prior abdominal imaging per PCP with ultrasound in January showing small amount of gallbladder sludge.  Repeat ultrasound 12/5 with no evidence of gallstones or wall thickening.   As symptoms are primarily affected by movement, query MSK etiology.  However, as she does have tenderness to palpation across the entire upper abdomen on exam today, unable to rule out GI etiology.  Discussed EGD, but patient also asking for CT of her abdomen pelvis and would like to pursue this first.  Will move forward with CT along with updating labs.  I will also start her empirically on Protonix 40 mg daily.  If CT is unrevealing, will pursue EGD.   Plan:  CT abdomen with contrast ASAP CBC, CMP, lipase, urine pregnancy test Start pantoprazole 40 mg daily. If CT is unrevealing, will move forward with EGD. TBD.   Ermalinda Memos, PA-C [facility] Gastroenterology 05/19/2023

## 2023-05-19 ENCOUNTER — Encounter: Payer: Self-pay | Admitting: Gastroenterology

## 2023-05-19 ENCOUNTER — Other Ambulatory Visit: Payer: Self-pay

## 2023-05-19 ENCOUNTER — Ambulatory Visit: Payer: 59 | Admitting: Gastroenterology

## 2023-05-19 VITALS — BP 136/91 | HR 74 | Temp 97.3°F | Ht 66.0 in | Wt 390.4 lb

## 2023-05-19 DIAGNOSIS — R1010 Upper abdominal pain, unspecified: Secondary | ICD-10-CM

## 2023-05-19 DIAGNOSIS — R1013 Epigastric pain: Secondary | ICD-10-CM

## 2023-05-19 DIAGNOSIS — R1011 Right upper quadrant pain: Secondary | ICD-10-CM | POA: Diagnosis not present

## 2023-05-19 MED ORDER — PANTOPRAZOLE SODIUM 40 MG PO TBEC
40.0000 mg | DELAYED_RELEASE_TABLET | Freq: Every day | ORAL | 3 refills | Status: AC
Start: 2023-05-19 — End: ?
  Filled 2023-05-19: qty 30, 30d supply, fill #0

## 2023-05-19 NOTE — Patient Instructions (Addendum)
Please start pantoprazole 40 mg daily 30 minutes before breakfast.  We will get you scheduled for a CT of your abdomen to further evaluate your upper abdominal pain.   Please have blood work at [facility] when you go to have your CT scan.  If your CT is unrevealing, we will schedule you for an upper endoscopy at [facility].   It was nice to meet you today!   Ermalinda Memos, PA-C [facility] Gastroenterology

## 2023-05-23 ENCOUNTER — Ambulatory Visit (HOSPITAL_COMMUNITY): Admission: RE | Admit: 2023-05-23 | Payer: 59 | Source: Ambulatory Visit

## 2023-05-26 ENCOUNTER — Other Ambulatory Visit (HOSPITAL_COMMUNITY)
Admission: RE | Admit: 2023-05-26 | Discharge: 2023-05-26 | Disposition: A | Discharge status: Home / Self Care | Payer: 59 | Source: Ambulatory Visit | Attending: Gastroenterology | Admitting: Gastroenterology

## 2023-05-26 ENCOUNTER — Ambulatory Visit (HOSPITAL_COMMUNITY)
Admission: RE | Admit: 2023-05-26 | Discharge: 2023-05-26 | Disposition: A | Discharge status: Home / Self Care | Payer: 59 | Source: Ambulatory Visit | Attending: Gastroenterology

## 2023-05-26 ENCOUNTER — Encounter (HOSPITAL_COMMUNITY): Payer: Self-pay

## 2023-05-26 DIAGNOSIS — R1010 Upper abdominal pain, unspecified: Secondary | ICD-10-CM | POA: Insufficient documentation

## 2023-05-26 DIAGNOSIS — K820 Obstruction of gallbladder: Secondary | ICD-10-CM | POA: Diagnosis not present

## 2023-05-26 DIAGNOSIS — K429 Umbilical hernia without obstruction or gangrene: Secondary | ICD-10-CM | POA: Diagnosis not present

## 2023-05-26 DIAGNOSIS — R1013 Epigastric pain: Secondary | ICD-10-CM | POA: Diagnosis not present

## 2023-05-26 LAB — CBC WITH DIFFERENTIAL/PLATELET
Abs Immature Granulocytes: 0.02 10*3/uL (ref 0.00–0.07)
Basophils Absolute: 0.1 10*3/uL (ref 0.0–0.1)
Basophils Relative: 1 %
Eosinophils Absolute: 0.1 10*3/uL (ref 0.0–0.5)
Eosinophils Relative: 2 %
HCT: 37.5 % (ref 36.0–46.0)
Hemoglobin: 11.8 g/dL — ABNORMAL LOW (ref 12.0–15.0)
Immature Granulocytes: 0 %
Lymphocytes Relative: 37 %
Lymphs Abs: 3 10*3/uL (ref 0.7–4.0)
MCH: 29.3 pg (ref 26.0–34.0)
MCHC: 31.5 g/dL (ref 30.0–36.0)
MCV: 93.1 fL (ref 80.0–100.0)
Monocytes Absolute: 0.8 10*3/uL (ref 0.1–1.0)
Monocytes Relative: 10 %
Neutro Abs: 4.1 10*3/uL (ref 1.7–7.7)
Neutrophils Relative %: 50 %
Platelets: 236 10*3/uL (ref 150–400)
RBC: 4.03 MIL/uL (ref 3.87–5.11)
RDW: 15.1 % (ref 11.5–15.5)
WBC: 8 10*3/uL (ref 4.0–10.5)
nRBC: 0 % (ref 0.0–0.2)

## 2023-05-26 LAB — COMPREHENSIVE METABOLIC PANEL
ALT: 14 U/L (ref 0–44)
AST: 18 U/L (ref 15–41)
Albumin: 3.4 g/dL — ABNORMAL LOW (ref 3.5–5.0)
Alkaline Phosphatase: 65 U/L (ref 38–126)
Anion gap: 9 (ref 5–15)
BUN: 12 mg/dL (ref 6–20)
CO2: 23 mmol/L (ref 22–32)
Calcium: 10.5 mg/dL — ABNORMAL HIGH (ref 8.9–10.3)
Chloride: 99 mmol/L (ref 98–111)
Creatinine, Ser: 0.77 mg/dL (ref 0.44–1.00)
GFR, Estimated: >60 mL/min (ref >60)
Glucose, Bld: 104 mg/dL — ABNORMAL HIGH (ref 70–99)
Potassium: 3.8 mmol/L (ref 3.5–5.1)
Sodium: 131 mmol/L — ABNORMAL LOW (ref 135–145)
Total Bilirubin: 0.3 mg/dL (ref <1.2)
Total Protein: 7.4 g/dL (ref 6.5–8.1)

## 2023-05-26 LAB — LIPASE, BLOOD: Lipase: 35 U/L (ref 11–51)

## 2023-05-26 LAB — PREGNANCY, URINE: Preg Test, Ur: NEGATIVE

## 2023-05-26 MED ORDER — IOHEXOL 350 MG/ML SOLN
100.0000 mL | Freq: Once | INTRAVENOUS | Status: AC | PRN
  Administered 2023-05-26: 90 mL via INTRAVENOUS

## 2023-06-11 ENCOUNTER — Other Ambulatory Visit: Payer: Self-pay | Admitting: Family Medicine

## 2023-06-11 DIAGNOSIS — M545 Low back pain, unspecified: Secondary | ICD-10-CM

## 2023-06-12 ENCOUNTER — Other Ambulatory Visit (HOSPITAL_BASED_OUTPATIENT_CLINIC_OR_DEPARTMENT_OTHER): Payer: Self-pay

## 2023-06-12 ENCOUNTER — Other Ambulatory Visit: Payer: Self-pay

## 2023-06-12 MED ORDER — CYCLOBENZAPRINE HCL 5 MG PO TABS
5.0000 mg | ORAL_TABLET | Freq: Every day | ORAL | 1 refills | Status: DC
  Filled 2023-06-12: qty 60, 60d supply, fill #0
  Filled 2023-08-12: qty 60, 60d supply, fill #1

## 2023-06-22 ENCOUNTER — Telehealth: Payer: Self-pay | Admitting: *Deleted

## 2023-06-22 NOTE — Telephone Encounter (Signed)
 LMOVM to return call. Pt wants Feb.  EGD with Dr. Marletta Lor: Dx: Upper abdominal pain ASA 3

## 2023-06-28 ENCOUNTER — Encounter: Payer: Self-pay | Admitting: *Deleted

## 2023-06-30 ENCOUNTER — Ambulatory Visit: Payer: 59 | Admitting: Family Medicine

## 2023-07-08 ENCOUNTER — Other Ambulatory Visit (HOSPITAL_COMMUNITY): Payer: Self-pay

## 2023-07-10 ENCOUNTER — Other Ambulatory Visit: Payer: Self-pay

## 2023-08-14 ENCOUNTER — Other Ambulatory Visit (HOSPITAL_COMMUNITY): Payer: Self-pay

## 2023-08-28 ENCOUNTER — Other Ambulatory Visit (HOSPITAL_COMMUNITY): Payer: Self-pay

## 2023-08-28 ENCOUNTER — Ambulatory Visit (INDEPENDENT_AMBULATORY_CARE_PROVIDER_SITE_OTHER): Payer: 59 | Admitting: Family Medicine

## 2023-08-28 ENCOUNTER — Other Ambulatory Visit: Payer: Self-pay

## 2023-08-28 ENCOUNTER — Encounter: Payer: Self-pay | Admitting: Family Medicine

## 2023-08-28 VITALS — BP 111/67 | Ht 66.0 in | Wt 397.0 lb

## 2023-08-28 DIAGNOSIS — I10 Essential (primary) hypertension: Secondary | ICD-10-CM | POA: Diagnosis not present

## 2023-08-28 DIAGNOSIS — E559 Vitamin D deficiency, unspecified: Secondary | ICD-10-CM

## 2023-08-28 DIAGNOSIS — F411 Generalized anxiety disorder: Secondary | ICD-10-CM

## 2023-08-28 DIAGNOSIS — M545 Low back pain, unspecified: Secondary | ICD-10-CM | POA: Diagnosis not present

## 2023-08-28 DIAGNOSIS — G479 Sleep disorder, unspecified: Secondary | ICD-10-CM | POA: Diagnosis not present

## 2023-08-28 MED ORDER — TELMISARTAN-HCTZ 40-12.5 MG PO TABS
1.0000 | ORAL_TABLET | Freq: Every day | ORAL | 0 refills | Status: DC
  Filled 2023-08-28: qty 150, 150d supply, fill #0
  Filled 2023-09-17 – 2023-09-25 (×3): qty 90, 90d supply, fill #0
  Filled 2024-01-01 – 2024-02-10 (×2): qty 60, 60d supply, fill #1

## 2023-08-28 MED ORDER — CYCLOBENZAPRINE HCL 5 MG PO TABS
5.0000 mg | ORAL_TABLET | Freq: Every day | ORAL | 1 refills | Status: DC
  Filled 2023-08-28 – 2023-10-15 (×2): qty 60, 60d supply, fill #0
  Filled 2023-11-17 – 2023-12-10 (×2): qty 60, 60d supply, fill #1

## 2023-08-28 MED ORDER — VITAMIN D (ERGOCALCIFEROL) 1.25 MG (50000 UNIT) PO CAPS
50000.0000 [IU] | ORAL_CAPSULE | ORAL | 0 refills | Status: AC
  Filled 2023-08-28: qty 12, 84d supply, fill #0

## 2023-08-28 MED ORDER — HYDROXYZINE PAMOATE 25 MG PO CAPS
25.0000 mg | ORAL_CAPSULE | Freq: Every evening | ORAL | 1 refills | Status: DC | PRN
  Filled 2023-08-28: qty 30, 30d supply, fill #0
  Filled 2023-09-25: qty 30, 30d supply, fill #1

## 2023-08-28 NOTE — Assessment & Plan Note (Signed)
 Stable on Zoloft to 50 mg  Denies SI/HI and AVH Encouraged to continue nonpharmacological management strategies , including mindfulness, deep breathing exercises, journaling, adhering to a heart-healthy diet, ensuring adequate rest, decreasing stress, and increasing physical activity to help manage anxiety.    08/28/2023   12:23 PM 05/16/2023    8:23 AM 04/03/2023   11:21 AM 03/03/2023   11:18 AM  GAD 7 : Generalized Anxiety Score  Nervous, Anxious, on Edge 0 0 0 0  Control/stop worrying 0 0 0 0  Worry too much - different things 1 0 0 0  Trouble relaxing 0 0 0 0  Restless 0 0 0 0  Easily annoyed or irritable 1 0 0 0  Afraid - awful might happen 0 0 0 0  Total GAD 7 Score 2 0 0 0  Anxiety Difficulty  Not difficult at all Not difficult at all Not difficult at all

## 2023-08-28 NOTE — Progress Notes (Signed)
 Established Patient Office Visit  Subjective:  Patient ID: [name], female    DOB: 10/13/1985  Age: 38 y.o. MRN: [id number]  CC:  Chief Complaint  Patient presents with   Follow-up    6 wk for Anxiety    HPI [name] is a 38 y.o. female presents for anxiety f/u. For the details of today's visit, please refer to the assessment and plan. For the details of today's visit, please refer to the assessment and plan.      Past Medical History:  Diagnosis Date   GERD (gastroesophageal reflux disease)    Hypertension    No pertinent past medical history    Postpartum pain 06/30/2011   Vaginal delivery, moderate bleeding, tender in abd, esp LLQ    Past Surgical History:  Procedure Laterality Date   BREAST BIOPSY Right 05/20/2022   MM RT BREAST BX W LOC DEV 1ST LESION IMAGE BX SPEC STEREO GUIDE 05/20/2022 [facility]   BREAST BIOPSY  07/15/2022   MM RT RADIOACTIVE SEED LOC MAMMO GUIDE 07/15/2022 [facility]   BREAST LUMPECTOMY WITH RADIOACTIVE SEED LOCALIZATION Right 07/18/2022   Procedure: RIGHT BREAST LUMPECTOMY WITH RADIOACTIVE SEED LOCALIZATION;  Surgeon: Griselda Miner, MD;  Location: [facility] OR;  Service: General;  Laterality: Right;   DILATION AND CURETTAGE OF UTERUS     oophrectomy and salpingectomy Left    VAGINAL DELIVERY  06/30/2011   Womens    Family History  Problem Relation Age of Onset   Breast cancer Mother    Breast cancer Maternal Grandmother    Anesthesia problems Neg Hx    Hypotension Neg Hx    Malignant hyperthermia Neg Hx    Pseudochol deficiency Neg Hx    Colon cancer Neg Hx     Social History   Socioeconomic History   Marital status: Single    Spouse name: Not on file   Number of children: Not on file   Years of education: Not on file   Highest education level: Associate degree: occupational, Scientist, product/process development, or vocational program  Occupational History   Not on file  Tobacco Use   Smoking status: Former    Current packs/day: 0.25     Average packs/day: 0.3 packs/day for 1 year (0.3 ttl pk-yrs)    Types: Cigarettes   Smokeless tobacco: Never  Vaping Use   Vaping status: Never Used  Substance and Sexual Activity   Alcohol use: Yes    Comment: every now and then   Drug use: No   Sexual activity: Not Currently    Birth control/protection: None    Comment: pt is currently pregnant  Other Topics Concern   Not on file  Social History Narrative   Not on file   Social Drivers of Health   Financial Resource Strain: Low Risk  (08/27/2023)   Overall Financial Resource Strain (CARDIA)    Difficulty of Paying Living Expenses: Not hard at all  Food Insecurity: No Food Insecurity (08/27/2023)   Hunger Vital Sign    Worried About Running Out of Food in the Last Year: Never true    Ran Out of Food in the Last Year: Never true  Transportation Needs: No Transportation Needs (08/27/2023)   PRAPARE - Administrator, Civil Service (Medical): No    Lack of Transportation (Non-Medical): No  Physical Activity: Insufficiently Active (08/27/2023)   Exercise Vital Sign    Days of Exercise per Week: 6 days    Minutes of Exercise per  Session: 20 min  Stress: Stress Concern Present (08/27/2023)   [employer] of Occupational Health - Occupational Stress Questionnaire    Feeling of Stress : To some extent  Social Connections: Socially Isolated (08/27/2023)   Social Connection and Isolation Panel [NHANES]    Frequency of Communication with Friends and Family: Three times a week    Frequency of Social Gatherings with Friends and Family: Never    Attends Religious Services: Never    Database administrator or Organizations: No    Attends Engineer, structural: Not on file    Marital Status: Never married  Catering manager Violence: Not on file    Outpatient Medications Prior to Visit  Medication Sig Dispense Refill   pantoprazole (PROTONIX) 40 MG tablet Take 1 tablet (40 mg total) by mouth daily before  breakfast. 30 tablet 3   phentermine 37.5 MG capsule Take 1 capsule (37.5 mg total) by mouth every morning. 30 capsule 0   sertraline (ZOLOFT) 50 MG tablet Take 1 tablet (50 mg total) by mouth daily. 30 tablet 3   cyclobenzaprine (FLEXERIL) 5 MG tablet Take 1 tablet (5 mg total) by mouth at bedtime. 60 tablet 1   telmisartan-hydrochlorothiazide (MICARDIS HCT) 40-12.5 MG tablet Take 1 tablet by mouth daily. 150 tablet 0   Vitamin D, Ergocalciferol, (DRISDOL) 1.25 MG (50000 UNIT) CAPS capsule Take 1 capsule (50,000 Units total) by mouth every 7 (seven) days. 12 capsule 0   No facility-administered medications prior to visit.    No Known Allergies  ROS Review of Systems  Constitutional:  Negative for chills and fever.  Eyes:  Negative for visual disturbance.  Respiratory:  Negative for chest tightness and shortness of breath.   Neurological:  Negative for dizziness and headaches.      Objective:    Physical Exam HENT:     Head: Normocephalic.     Mouth/Throat:     Mouth: Mucous membranes are moist.  Cardiovascular:     Rate and Rhythm: Normal rate.     Heart sounds: Normal heart sounds.  Pulmonary:     Effort: Pulmonary effort is normal.     Breath sounds: Normal breath sounds.  Neurological:     Mental Status: She is alert.     BP 111/67   Ht 5\' 6"  (1.676 m)   Wt (!) 397 lb 0.6 oz (180.1 kg)   LMP 08/02/2023   BMI 64.08 kg/m  Wt Readings from Last 3 Encounters:  08/28/23 (!) 397 lb 0.6 oz (180.1 kg)  05/19/23 (!) 390 lb 6.4 oz (177.1 kg)  05/16/23 (!) 396 lb 0.6 oz (179.6 kg)    Lab Results  Component Value Date   TSH 3.960 10/27/2022   Lab Results  Component Value Date   WBC 8.0 05/26/2023   HGB 11.8 (L) 05/26/2023   HCT 37.5 05/26/2023   MCV 93.1 05/26/2023   PLT 236 05/26/2023   Lab Results  Component Value Date   NA 131 (L) 05/26/2023   K 3.8 05/26/2023   CO2 23 05/26/2023   GLUCOSE 104 (H) 05/26/2023   BUN 12 05/26/2023   CREATININE 0.77  05/26/2023   BILITOT 0.3 05/26/2023   ALKPHOS 65 05/26/2023   AST 18 05/26/2023   ALT 14 05/26/2023   PROT 7.4 05/26/2023   ALBUMIN 3.4 (L) 05/26/2023   CALCIUM 10.5 (H) 05/26/2023   ANIONGAP 9 05/26/2023   EGFR 90 10/27/2022   Lab Results  Component Value Date   CHOL 170  10/27/2022   Lab Results  Component Value Date   HDL 62 10/27/2022   Lab Results  Component Value Date   LDLCALC 93 10/27/2022   Lab Results  Component Value Date   TRIG 80 10/27/2022   Lab Results  Component Value Date   CHOLHDL 2.7 10/27/2022   Lab Results  Component Value Date   HGBA1C 6.3 (H) 10/27/2022      Assessment & Plan:  GAD (generalized anxiety disorder) Assessment & Plan: Stable on Zoloft to 50 mg  Denies SI/HI and AVH Encouraged to continue nonpharmacological management strategies , including mindfulness, deep breathing exercises, journaling, adhering to a heart-healthy diet, ensuring adequate rest, decreasing stress, and increasing physical activity to help manage anxiety.    08/28/2023   12:23 PM 05/16/2023    8:23 AM 04/03/2023   11:21 AM 03/03/2023   11:18 AM  GAD 7 : Generalized Anxiety Score  Nervous, Anxious, on Edge 0 0 0 0  Control/stop worrying 0 0 0 0  Worry too much - different things 1 0 0 0  Trouble relaxing 0 0 0 0  Restless 0 0 0 0  Easily annoyed or irritable 1 0 0 0  Afraid - awful might happen 0 0 0 0  Total GAD 7 Score 2 0 0 0  Anxiety Difficulty  Not difficult at all Not difficult at all Not difficult at all         Sleep disturbance Assessment & Plan: The patient works night shifts and reports difficulty falling asleep on her days off. She has attempted over-the-counter sleep aids with minimal relief. Will initiate therapy today with Hydroxyzine 25 mg as needed for sleep. She is encouraged to increase the dose to 50 mg after 2 weeks, if needed. The patient is advised to monitor for side effects, including: Drowsiness or grogginess the next  day Dizziness or lightheadedness Dry mouth Changes in mood or alertness Encouraged to follow up if side effects become bothersome or if sleep disturbances persist.    Orders: -     hydrOXYzine Pamoate; Take 1 capsule (25 mg total) by mouth at bedtime as needed.  Dispense: 30 capsule; Refill: 1  Lumbar pain -     Cyclobenzaprine HCl; Take 1 tablet (5 mg total) by mouth at bedtime.  Dispense: 60 tablet; Refill: 1  Morbid obesity (HCC)  Essential hypertension -     Telmisartan-HCTZ; Take 1 tablet by mouth daily.  Dispense: 150 tablet; Refill: 0  Vitamin D deficiency -     Vitamin D (Ergocalciferol); Take 1 capsule (50,000 Units total) by mouth every 7 (seven) days.  Dispense: 12 capsule; Refill: 0  Note: This chart has been completed using Engineer, civil (consulting) software, and while attempts have been made to ensure accuracy, certain words and phrases may not be transcribed as intended.    Follow-up: Return in about 4 months (around 12/28/2023).   Gilmore Laroche, FNP

## 2023-08-28 NOTE — Patient Instructions (Addendum)
 I appreciate the opportunity to provide care to you today!    Follow up:  4 months  Sleep Disturbance Start taking Hydroxyzine 25 mg as needed for sleep. You may increase the dose to 50 mg after 2 weeks, if needed.  Please monitor for side effects, including:  Drowsiness or grogginess the next day Dizziness or lightheadedness Dry mouth Changes in mood or alertness Follow up if side effects become bothersome or if sleep disturbances persist.  Insomnia For managing insomnia non-pharmacologically, consider the following sleep hygiene practices: Establish a Bedtime Routine: Create a consistent routine before bed to signal to your body that it is time to wind down. Maintain a Regular Sleep-Wake Schedule: Go to bed and wake up at the same time every day, even on weekends. Avoid Electronics Before Bed: Limit the use of computers and other electronic devices at least one hour before bedtime to reduce blue light exposure. Limit Time in Bed: If unable to fall asleep within 15 minutes, get out of bed and engage in a relaxing activity before trying again. Manage Daily Stress: Incorporate stress-reducing activities into your daily routine to alleviate anxiety and tension. Avoid Late Exercise: Refrain from vigorous exercise close to bedtime, as it can interfere with sleep. Use Bed for Sleep and Intimacy Only: Reserve the bed for sleep and sexual activity to strengthen the association between your bed and rest. Remove Electronics from [employer] Area: Consider keeping electronic devices away from the bedroom to minimize distractions.    Please continue to a heart-healthy diet and increase your physical activities. Try to exercise for at least five days a week.    It was a pleasure to see you and I look forward to continuing to work together on your health and well-being. Please do not hesitate to call the office if you need care or have questions about your care.  In case of emergency,  please visit the Emergency Department for urgent care, or contact our clinic at [phone] to schedule an appointment. We're here to help you!   Have a wonderful day and week. With Gratitude, Gilmore Laroche MSN, FNP-BC

## 2023-08-28 NOTE — Assessment & Plan Note (Signed)
 The patient works night shifts and reports difficulty falling asleep on her days off. She has attempted over-the-counter sleep aids with minimal relief. Will initiate therapy today with Hydroxyzine 25 mg as needed for sleep. She is encouraged to increase the dose to 50 mg after 2 weeks, if needed. The patient is advised to monitor for side effects, including: Drowsiness or grogginess the next day Dizziness or lightheadedness Dry mouth Changes in mood or alertness Encouraged to follow up if side effects become bothersome or if sleep disturbances persist.

## 2023-09-17 ENCOUNTER — Other Ambulatory Visit (HOSPITAL_COMMUNITY): Payer: Self-pay

## 2023-09-18 ENCOUNTER — Other Ambulatory Visit (HOSPITAL_COMMUNITY): Payer: Self-pay

## 2023-09-19 ENCOUNTER — Other Ambulatory Visit: Payer: Self-pay

## 2023-09-22 ENCOUNTER — Other Ambulatory Visit: Payer: Self-pay

## 2023-09-26 ENCOUNTER — Other Ambulatory Visit (HOSPITAL_COMMUNITY): Payer: Self-pay

## 2023-09-26 ENCOUNTER — Other Ambulatory Visit: Payer: Self-pay

## 2023-09-29 ENCOUNTER — Other Ambulatory Visit (HOSPITAL_COMMUNITY): Payer: Self-pay

## 2023-09-29 ENCOUNTER — Other Ambulatory Visit: Payer: Self-pay | Admitting: Family Medicine

## 2023-09-29 ENCOUNTER — Other Ambulatory Visit: Payer: Self-pay

## 2023-09-29 DIAGNOSIS — F411 Generalized anxiety disorder: Secondary | ICD-10-CM

## 2023-09-29 MED ORDER — SERTRALINE HCL 50 MG PO TABS
50.0000 mg | ORAL_TABLET | Freq: Every day | ORAL | 3 refills | Status: DC
Start: 2023-09-29 — End: 2024-04-05
  Filled 2023-09-29 – 2023-10-15 (×2): qty 30, 30d supply, fill #0
  Filled 2023-11-17: qty 30, 30d supply, fill #1
  Filled 2024-01-01 – 2024-02-10 (×2): qty 30, 30d supply, fill #2
  Filled 2024-03-19: qty 30, 30d supply, fill #3

## 2023-10-16 ENCOUNTER — Other Ambulatory Visit: Payer: Self-pay

## 2023-10-16 ENCOUNTER — Other Ambulatory Visit (HOSPITAL_COMMUNITY): Payer: Self-pay

## 2023-11-17 ENCOUNTER — Other Ambulatory Visit: Payer: Self-pay

## 2023-11-17 ENCOUNTER — Other Ambulatory Visit (HOSPITAL_COMMUNITY): Payer: Self-pay

## 2023-11-17 ENCOUNTER — Other Ambulatory Visit: Payer: Self-pay | Admitting: Family Medicine

## 2023-11-17 DIAGNOSIS — G479 Sleep disorder, unspecified: Secondary | ICD-10-CM

## 2023-11-17 MED ORDER — HYDROXYZINE PAMOATE 25 MG PO CAPS
25.0000 mg | ORAL_CAPSULE | Freq: Every evening | ORAL | 1 refills | Status: DC | PRN
  Filled 2023-11-17: qty 30, 30d supply, fill #0
  Filled 2024-02-10: qty 30, 30d supply, fill #1

## 2023-12-11 ENCOUNTER — Other Ambulatory Visit: Payer: Self-pay

## 2023-12-28 ENCOUNTER — Ambulatory Visit: Admitting: Family Medicine

## 2024-01-01 ENCOUNTER — Other Ambulatory Visit (HOSPITAL_COMMUNITY): Payer: Self-pay

## 2024-01-01 ENCOUNTER — Encounter (HOSPITAL_COMMUNITY): Payer: Self-pay

## 2024-01-02 ENCOUNTER — Other Ambulatory Visit: Payer: Self-pay

## 2024-01-05 ENCOUNTER — Other Ambulatory Visit: Payer: Self-pay

## 2024-02-10 ENCOUNTER — Other Ambulatory Visit (HOSPITAL_COMMUNITY): Payer: Self-pay

## 2024-02-10 ENCOUNTER — Other Ambulatory Visit: Payer: Self-pay | Admitting: Family Medicine

## 2024-02-10 DIAGNOSIS — M545 Low back pain, unspecified: Secondary | ICD-10-CM

## 2024-02-11 ENCOUNTER — Other Ambulatory Visit: Payer: Self-pay

## 2024-02-13 ENCOUNTER — Other Ambulatory Visit (HOSPITAL_COMMUNITY): Payer: Self-pay

## 2024-02-13 ENCOUNTER — Other Ambulatory Visit: Payer: Self-pay

## 2024-02-13 ENCOUNTER — Encounter: Payer: Self-pay | Admitting: Pharmacist

## 2024-02-16 ENCOUNTER — Other Ambulatory Visit (HOSPITAL_COMMUNITY): Payer: Self-pay

## 2024-02-16 MED ORDER — CYCLOBENZAPRINE HCL 5 MG PO TABS
5.0000 mg | ORAL_TABLET | Freq: Every day | ORAL | 1 refills | Status: DC
  Filled 2024-02-16: qty 60, 60d supply, fill #0
  Filled 2024-04-14: qty 60, 60d supply, fill #1

## 2024-03-07 ENCOUNTER — Encounter: Admitting: Family Medicine

## 2024-03-19 ENCOUNTER — Other Ambulatory Visit: Payer: Self-pay

## 2024-03-19 ENCOUNTER — Other Ambulatory Visit (HOSPITAL_COMMUNITY): Payer: Self-pay

## 2024-04-05 ENCOUNTER — Other Ambulatory Visit: Payer: Self-pay | Admitting: Family Medicine

## 2024-04-05 DIAGNOSIS — F411 Generalized anxiety disorder: Secondary | ICD-10-CM

## 2024-04-05 DIAGNOSIS — G479 Sleep disorder, unspecified: Secondary | ICD-10-CM

## 2024-04-06 ENCOUNTER — Other Ambulatory Visit: Payer: Self-pay | Admitting: Family Medicine

## 2024-04-06 DIAGNOSIS — I10 Essential (primary) hypertension: Secondary | ICD-10-CM

## 2024-04-08 ENCOUNTER — Other Ambulatory Visit: Payer: Self-pay

## 2024-04-08 ENCOUNTER — Other Ambulatory Visit (HOSPITAL_COMMUNITY): Payer: Self-pay

## 2024-04-08 MED ORDER — SERTRALINE HCL 50 MG PO TABS
50.0000 mg | ORAL_TABLET | Freq: Every day | ORAL | 3 refills | Status: AC
  Filled 2024-04-08 – 2024-04-14 (×2): qty 30, 30d supply, fill #0
  Filled 2024-05-27 – 2024-06-05 (×2): qty 30, 30d supply, fill #1
  Filled 2024-07-08: qty 60, 60d supply, fill #2

## 2024-04-08 MED ORDER — TELMISARTAN-HCTZ 40-12.5 MG PO TABS
1.0000 | ORAL_TABLET | Freq: Every day | ORAL | 0 refills | Status: AC
  Filled 2024-04-08: qty 90, 90d supply, fill #0
  Filled 2024-07-08: qty 30, 30d supply, fill #1

## 2024-04-08 MED ORDER — HYDROXYZINE PAMOATE 25 MG PO CAPS
25.0000 mg | ORAL_CAPSULE | Freq: Every evening | ORAL | 1 refills | Status: AC | PRN
  Filled 2024-04-08: qty 30, 30d supply, fill #0

## 2024-04-10 ENCOUNTER — Other Ambulatory Visit (HOSPITAL_COMMUNITY): Payer: Self-pay

## 2024-04-11 ENCOUNTER — Other Ambulatory Visit (HOSPITAL_COMMUNITY): Payer: Self-pay

## 2024-04-11 ENCOUNTER — Other Ambulatory Visit: Payer: Self-pay

## 2024-04-15 ENCOUNTER — Other Ambulatory Visit: Payer: Self-pay

## 2024-04-15 ENCOUNTER — Other Ambulatory Visit (HOSPITAL_COMMUNITY): Payer: Self-pay

## 2024-05-27 ENCOUNTER — Other Ambulatory Visit: Payer: Self-pay

## 2024-05-28 ENCOUNTER — Other Ambulatory Visit: Payer: Self-pay

## 2024-05-28 ENCOUNTER — Encounter: Payer: Self-pay | Admitting: Pharmacist

## 2024-05-30 ENCOUNTER — Other Ambulatory Visit: Payer: Self-pay

## 2024-06-05 ENCOUNTER — Other Ambulatory Visit (HOSPITAL_COMMUNITY): Payer: Self-pay

## 2024-06-05 ENCOUNTER — Other Ambulatory Visit: Payer: Self-pay | Admitting: Family Medicine

## 2024-06-05 ENCOUNTER — Other Ambulatory Visit: Payer: Self-pay

## 2024-06-05 DIAGNOSIS — M545 Low back pain, unspecified: Secondary | ICD-10-CM

## 2024-06-07 ENCOUNTER — Other Ambulatory Visit (HOSPITAL_COMMUNITY): Payer: Self-pay

## 2024-06-07 ENCOUNTER — Other Ambulatory Visit: Payer: Self-pay

## 2024-06-07 MED ORDER — CYCLOBENZAPRINE HCL 5 MG PO TABS
5.0000 mg | ORAL_TABLET | Freq: Every day | ORAL | 1 refills | Status: AC
  Filled 2024-06-07 (×2): qty 60, 60d supply, fill #0

## 2024-07-08 ENCOUNTER — Other Ambulatory Visit (HOSPITAL_COMMUNITY): Payer: Self-pay

## 2024-07-08 ENCOUNTER — Other Ambulatory Visit: Payer: Self-pay

## 2024-07-09 ENCOUNTER — Encounter: Payer: Self-pay | Admitting: Pharmacist

## 2024-07-09 ENCOUNTER — Other Ambulatory Visit: Payer: Self-pay

## 2024-07-09 ENCOUNTER — Other Ambulatory Visit (HOSPITAL_COMMUNITY): Payer: Self-pay

## 2024-08-01 ENCOUNTER — Ambulatory Visit: Admitting: Family Medicine
